# Patient Record
Sex: Female | Born: 1935
Health system: Southern US, Community
[De-identification: ages and names within clinical notes are randomized; demographics above are authoritative.]

## PROBLEM LIST (undated history)

## (undated) DIAGNOSIS — T4145XA Adverse effect of unspecified anesthetic, initial encounter: Secondary | ICD-10-CM

## (undated) DIAGNOSIS — M1990 Unspecified osteoarthritis, unspecified site: Secondary | ICD-10-CM

## (undated) DIAGNOSIS — R011 Cardiac murmur, unspecified: Secondary | ICD-10-CM

## (undated) DIAGNOSIS — I739 Peripheral vascular disease, unspecified: Secondary | ICD-10-CM

## (undated) DIAGNOSIS — E785 Hyperlipidemia, unspecified: Secondary | ICD-10-CM

## (undated) DIAGNOSIS — F32A Depression, unspecified: Secondary | ICD-10-CM

## (undated) DIAGNOSIS — K219 Gastro-esophageal reflux disease without esophagitis: Secondary | ICD-10-CM

## (undated) DIAGNOSIS — Z9289 Personal history of other medical treatment: Secondary | ICD-10-CM

## (undated) DIAGNOSIS — I10 Essential (primary) hypertension: Secondary | ICD-10-CM

## (undated) DIAGNOSIS — H3530 Unspecified macular degeneration: Secondary | ICD-10-CM

## (undated) DIAGNOSIS — F419 Anxiety disorder, unspecified: Secondary | ICD-10-CM

## (undated) DIAGNOSIS — E119 Type 2 diabetes mellitus without complications: Secondary | ICD-10-CM

## (undated) DIAGNOSIS — N200 Calculus of kidney: Secondary | ICD-10-CM

## (undated) DIAGNOSIS — F329 Major depressive disorder, single episode, unspecified: Secondary | ICD-10-CM

## (undated) DIAGNOSIS — Z87442 Personal history of urinary calculi: Secondary | ICD-10-CM

## (undated) DIAGNOSIS — R7303 Prediabetes: Secondary | ICD-10-CM

## (undated) DIAGNOSIS — T8859XA Other complications of anesthesia, initial encounter: Secondary | ICD-10-CM

## (undated) HISTORY — DX: Anxiety disorder, unspecified: F41.9

## (undated) HISTORY — PX: JOINT REPLACEMENT: SHX530

## (undated) HISTORY — PX: COLON SURGERY: SHX602

## (undated) HISTORY — DX: Major depressive disorder, single episode, unspecified: F32.9

## (undated) HISTORY — PX: APPENDECTOMY: SHX54

## (undated) HISTORY — DX: Type 2 diabetes mellitus without complications: E11.9

## (undated) HISTORY — DX: Hyperlipidemia, unspecified: E78.5

## (undated) HISTORY — PX: EYE SURGERY: SHX253

## (undated) HISTORY — PX: FRACTURE SURGERY: SHX138

## (undated) HISTORY — DX: Depression, unspecified: F32.A

## (undated) HISTORY — DX: Cardiac murmur, unspecified: R01.1

---

## 1999-06-12 ENCOUNTER — Encounter: Payer: Self-pay | Admitting: Orthopedic Surgery

## 1999-06-12 ENCOUNTER — Ambulatory Visit (HOSPITAL_COMMUNITY): Admission: RE | Admit: 1999-06-12 | Discharge: 1999-06-12 | Payer: Self-pay | Admitting: Orthopedic Surgery

## 2000-07-28 DIAGNOSIS — I10 Essential (primary) hypertension: Secondary | ICD-10-CM | POA: Insufficient documentation

## 2001-02-12 DIAGNOSIS — K5730 Diverticulosis of large intestine without perforation or abscess without bleeding: Secondary | ICD-10-CM | POA: Insufficient documentation

## 2001-02-12 DIAGNOSIS — M810 Age-related osteoporosis without current pathological fracture: Secondary | ICD-10-CM | POA: Insufficient documentation

## 2001-02-12 DIAGNOSIS — E785 Hyperlipidemia, unspecified: Secondary | ICD-10-CM | POA: Insufficient documentation

## 2004-09-07 ENCOUNTER — Inpatient Hospital Stay: Payer: Self-pay

## 2004-11-18 ENCOUNTER — Emergency Department: Payer: Self-pay | Admitting: General Practice

## 2005-03-14 ENCOUNTER — Ambulatory Visit: Payer: Self-pay | Admitting: Internal Medicine

## 2005-04-03 ENCOUNTER — Ambulatory Visit: Payer: Self-pay | Admitting: Internal Medicine

## 2005-05-03 ENCOUNTER — Ambulatory Visit: Payer: Self-pay | Admitting: Internal Medicine

## 2005-05-15 ENCOUNTER — Ambulatory Visit: Payer: Self-pay | Admitting: Family Medicine

## 2006-07-10 ENCOUNTER — Ambulatory Visit: Payer: Self-pay | Admitting: Family Medicine

## 2007-07-03 DIAGNOSIS — E119 Type 2 diabetes mellitus without complications: Secondary | ICD-10-CM | POA: Insufficient documentation

## 2007-07-08 DIAGNOSIS — F4322 Adjustment disorder with anxiety: Secondary | ICD-10-CM | POA: Insufficient documentation

## 2007-08-04 ENCOUNTER — Ambulatory Visit: Payer: Self-pay | Admitting: Family Medicine

## 2007-08-11 ENCOUNTER — Ambulatory Visit: Payer: Self-pay | Admitting: Gastroenterology

## 2007-10-21 ENCOUNTER — Other Ambulatory Visit: Payer: Self-pay

## 2007-10-21 ENCOUNTER — Inpatient Hospital Stay: Payer: Self-pay | Admitting: General Practice

## 2007-12-22 ENCOUNTER — Encounter: Payer: Self-pay | Admitting: General Practice

## 2008-01-02 ENCOUNTER — Encounter: Payer: Self-pay | Admitting: General Practice

## 2008-06-03 HISTORY — PX: BIOPSY THYROID: PRO38

## 2008-06-09 ENCOUNTER — Ambulatory Visit: Payer: Self-pay | Admitting: Unknown Physician Specialty

## 2008-08-11 ENCOUNTER — Ambulatory Visit: Payer: Self-pay | Admitting: Family Medicine

## 2009-08-25 DIAGNOSIS — M1990 Unspecified osteoarthritis, unspecified site: Secondary | ICD-10-CM | POA: Insufficient documentation

## 2009-11-09 ENCOUNTER — Ambulatory Visit: Payer: Self-pay | Admitting: Family Medicine

## 2011-03-26 ENCOUNTER — Ambulatory Visit: Payer: Self-pay | Admitting: Family Medicine

## 2011-06-19 ENCOUNTER — Ambulatory Visit: Payer: Self-pay | Admitting: Family Medicine

## 2011-07-01 ENCOUNTER — Ambulatory Visit: Payer: Self-pay | Admitting: Family Medicine

## 2011-07-25 ENCOUNTER — Ambulatory Visit: Payer: Self-pay | Admitting: General Surgery

## 2012-02-10 ENCOUNTER — Ambulatory Visit: Payer: Self-pay | Admitting: General Surgery

## 2012-03-02 ENCOUNTER — Ambulatory Visit: Payer: Self-pay | Admitting: Physical Medicine and Rehabilitation

## 2012-09-12 ENCOUNTER — Encounter (HOSPITAL_COMMUNITY): Payer: Self-pay | Admitting: Physical Medicine and Rehabilitation

## 2012-09-12 ENCOUNTER — Emergency Department (HOSPITAL_COMMUNITY)
Admission: EM | Admit: 2012-09-12 | Discharge: 2012-09-12 | Disposition: A | Discharge status: Home / Self Care | Payer: Medicare Other | Attending: Emergency Medicine | Admitting: Emergency Medicine

## 2012-09-12 DIAGNOSIS — G8929 Other chronic pain: Secondary | ICD-10-CM | POA: Insufficient documentation

## 2012-09-12 DIAGNOSIS — M5416 Radiculopathy, lumbar region: Secondary | ICD-10-CM

## 2012-09-12 DIAGNOSIS — IMO0002 Reserved for concepts with insufficient information to code with codable children: Secondary | ICD-10-CM | POA: Insufficient documentation

## 2012-09-12 HISTORY — DX: Unspecified macular degeneration: H35.30

## 2012-09-12 MED ORDER — ONDANSETRON HCL 4 MG/2ML IJ SOLN
4.0000 mg | Freq: Once | INTRAMUSCULAR | Status: AC
  Administered 2012-09-12: 4 mg via INTRAVENOUS
  Filled 2012-09-12: qty 2

## 2012-09-12 MED ORDER — MORPHINE SULFATE 2 MG/ML IJ SOLN
2.0000 mg | Freq: Once | INTRAMUSCULAR | Status: AC
  Administered 2012-09-12: 2 mg via INTRAVENOUS
  Filled 2012-09-12: qty 1

## 2012-09-12 MED ORDER — KETOROLAC TROMETHAMINE 30 MG/ML IJ SOLN
15.0000 mg | Freq: Once | INTRAMUSCULAR | Status: AC
  Administered 2012-09-12: 15 mg via INTRAVENOUS
  Filled 2012-09-12: qty 1

## 2012-09-12 MED ORDER — MORPHINE SULFATE 4 MG/ML IJ SOLN
4.0000 mg | Freq: Once | INTRAMUSCULAR | Status: AC
  Administered 2012-09-12: 4 mg via INTRAVENOUS
  Filled 2012-09-12: qty 1

## 2012-09-12 MED ORDER — OXYCODONE-ACETAMINOPHEN 5-325 MG PO TABS: 1.0000 | ORAL_TABLET | ORAL | Status: DC | PRN

## 2012-09-12 NOTE — ED Notes (Signed)
Pt presents to department for evaluation of chronic back pain. Pt states no relief with pain medications. Scheduled to see [facility] on 4/22. 10/10 pain that becomes worse with standing. Pt is alert and oriented x4.

## 2012-09-12 NOTE — ED Provider Notes (Signed)
History     CSN: [id number]  Arrival date & time 09/12/12  1151   First MD Initiated Contact with Patient 09/12/12 1309      Chief Complaint  Patient presents with  . Back Pain    (Consider location/radiation/quality/duration/timing/severity/associated sxs/prior treatment) HPI Comments: Patient comes to the emergency department for evaluation of back pain. Patient reports that she has been having progressively worsening low back pain for one year. She has been seeing a chiropractor with improvement, but in the last couple of weeks, pain has worsened. Pain is now severe in the lower back it radiates down the left leg. No numbness or tingling in the lower extremity. No change in bowel or bladder function. Patient reports that she has an appointment to see a neurosurgeon on the 22nd of this month. She has been using ibuprofen and Ultram for the pain, but they're not helping.  Patient is a 79 y.o. female presenting with back pain.  Back Pain Associated symptoms: no weakness     Past Medical History  Diagnosis Date  . Macular degeneration     No past surgical history on file.  No family history on file.  History  Substance Use Topics  . Smoking status: Never Smoker   . Smokeless tobacco: Not on file  . Alcohol Use: No    OB History   Grav Para Term Preterm Abortions TAB SAB Ect Mult Living                  Review of Systems  Musculoskeletal: Positive for back pain.  Neurological: Negative for weakness.  All other systems reviewed and are negative.    Allergies  Review of patient's allergies indicates no known allergies.  Home Medications  No current outpatient prescriptions on file.  BP 162/63  Pulse 72  Temp(Src) 98.1 F (36.7 C) (Oral)  Resp 18  SpO2 99%  Physical Exam  Constitutional: She is oriented to person, place, and time. She appears well-developed and well-nourished. No distress.  HENT:  Head: Normocephalic and atraumatic.  Right Ear: Hearing  normal.  Nose: Nose normal.  Mouth/Throat: Oropharynx is clear and moist and mucous membranes are normal.  Eyes: Conjunctivae and EOM are normal. Pupils are equal, round, and reactive to light.  Neck: Normal range of motion. Neck supple.  Cardiovascular: Normal rate, regular rhythm, S1 normal and S2 normal.  Exam reveals no gallop and no friction rub.   No murmur heard. Pulmonary/Chest: Effort normal and breath sounds normal. No respiratory distress. She exhibits no tenderness.  Abdominal: Soft. Normal appearance and bowel sounds are normal. There is no hepatosplenomegaly. There is no tenderness. There is no rebound, no guarding, no tenderness at McBurney's point and negative Murphy's sign. No hernia.  Musculoskeletal: Normal range of motion.  Neurological: She is alert and oriented to person, place, and time. She has normal strength. No cranial nerve deficit or sensory deficit. Coordination normal. GCS eye subscore is 4. GCS verbal subscore is 5. GCS motor subscore is 6.  Skin: Skin is warm, dry and intact. No rash noted. No cyanosis.  Psychiatric: She has a normal mood and affect. Her speech is normal and behavior is normal. Thought content normal.    ED Course  Procedures (including critical care time)  Labs Reviewed - No data to display No results found.   Diagnosis: Back pain    MDM  This and has low back pain radiating to the left leg has been present for a year. He has  worsened over recent weeks. Patient has been scheduled for followup with neurosurgery in 2 weeks. Her Ultram is not helping. Patient has normal strength and sensation. No neurologic deficits. She feels much better after being administered analgesia here in the ER. Will prescribe Percocet.        Gilda Crease, MD 09/12/12 1550

## 2012-10-08 ENCOUNTER — Other Ambulatory Visit: Payer: Self-pay | Admitting: Neurosurgery

## 2012-10-16 ENCOUNTER — Encounter (HOSPITAL_COMMUNITY): Payer: Self-pay

## 2012-10-17 NOTE — Pre-Procedure Instructions (Signed)
[name]  10/17/2012   Your procedure is scheduled on:  Friday, May 23rd  Report to [facility] at 0630 AM.  Call this number if you have problems the morning of surgery: [phone]   Remember:   Do not eat food or drink liquids after midnight.    Take these medicines the morning of surgery with A SIP OF WATER: xanax, vicodin if needed   Do not wear jewelry, make-up or nail polish.  Do not wear lotions, powders, or perfumes. You may wear deodorant.  Do not shave 48 hours prior to surgery. Men may shave face and neck.  Do not bring valuables to the hospital.  Contacts, dentures or bridgework may not be worn into surgery.  Leave suitcase in the car. After surgery it may be brought to your room.  For patients admitted to the hospital, checkout time is 11:00 AM the day of  discharge.   Patients discharged the day of surgery will not be allowed to drive  home.  Special Instructions: Shower using CHG 2 nights before surgery and the night before surgery.  If you shower the day of surgery use CHG.  Use special wash - you have one bottle of CHG for all showers.  You should use approximately 1/3 of the bottle for each shower.   Please read over the following fact sheets that you were given: Pain Booklet, Coughing and Deep Breathing, Blood Transfusion Information, MRSA Information and Surgical Site Infection Prevention

## 2012-10-19 ENCOUNTER — Encounter (HOSPITAL_COMMUNITY): Payer: Self-pay

## 2012-10-19 ENCOUNTER — Encounter (HOSPITAL_COMMUNITY)
Admission: RE | Admit: 2012-10-19 | Discharge: 2012-10-19 | Disposition: A | Discharge status: Home / Self Care | Payer: Medicare Other | Source: Ambulatory Visit | Attending: Neurosurgery | Admitting: Neurosurgery

## 2012-10-19 ENCOUNTER — Ambulatory Visit (HOSPITAL_COMMUNITY)
Admission: RE | Admit: 2012-10-19 | Discharge: 2012-10-19 | Disposition: A | Discharge status: Home / Self Care | Payer: Medicare Other | Source: Ambulatory Visit | Attending: Neurosurgery | Admitting: Neurosurgery

## 2012-10-19 DIAGNOSIS — Z01812 Encounter for preprocedural laboratory examination: Secondary | ICD-10-CM | POA: Insufficient documentation

## 2012-10-19 DIAGNOSIS — Z01810 Encounter for preprocedural cardiovascular examination: Secondary | ICD-10-CM | POA: Insufficient documentation

## 2012-10-19 DIAGNOSIS — Z01818 Encounter for other preprocedural examination: Secondary | ICD-10-CM | POA: Insufficient documentation

## 2012-10-19 DIAGNOSIS — K219 Gastro-esophageal reflux disease without esophagitis: Secondary | ICD-10-CM | POA: Insufficient documentation

## 2012-10-19 DIAGNOSIS — I10 Essential (primary) hypertension: Secondary | ICD-10-CM | POA: Insufficient documentation

## 2012-10-19 HISTORY — DX: Calculus of kidney: N20.0

## 2012-10-19 HISTORY — DX: Essential (primary) hypertension: I10

## 2012-10-19 HISTORY — DX: Adverse effect of unspecified anesthetic, initial encounter: T41.45XA

## 2012-10-19 HISTORY — DX: Personal history of other medical treatment: Z92.89

## 2012-10-19 HISTORY — DX: Unspecified osteoarthritis, unspecified site: M19.90

## 2012-10-19 HISTORY — DX: Other complications of anesthesia, initial encounter: T88.59XA

## 2012-10-19 HISTORY — DX: Gastro-esophageal reflux disease without esophagitis: K21.9

## 2012-10-19 HISTORY — DX: Peripheral vascular disease, unspecified: I73.9

## 2012-10-19 HISTORY — DX: Prediabetes: R73.03

## 2012-10-19 LAB — CBC
HCT: 38.8 % (ref 36.0–46.0)
Hemoglobin: 13.4 g/dL (ref 12.0–15.0)
MCH: 29.6 pg (ref 26.0–34.0)
MCV: 85.7 fL (ref 78.0–100.0)
RBC: 4.53 MIL/uL (ref 3.87–5.11)

## 2012-10-19 LAB — BASIC METABOLIC PANEL
CO2: 25 mEq/L (ref 19–32)
Glucose, Bld: 102 mg/dL — ABNORMAL HIGH (ref 70–99)
Potassium: 4.2 mEq/L (ref 3.5–5.1)
Sodium: 140 mEq/L (ref 135–145)

## 2012-10-19 LAB — ABO/RH: ABO/RH(D): A POS

## 2012-10-22 MED ORDER — CEFAZOLIN SODIUM-DEXTROSE 2-3 GM-% IV SOLR: 2.0000 g | INTRAVENOUS | Status: DC

## 2012-10-23 ENCOUNTER — Inpatient Hospital Stay (HOSPITAL_COMMUNITY)
Admission: RE | Admit: 2012-10-23 | Discharge: 2012-10-29 | DRG: 460 | Disposition: A | Discharge status: Skilled Nursing Facility | Payer: Medicare Other | Source: Ambulatory Visit | Attending: Neurosurgery | Admitting: Neurosurgery

## 2012-10-23 ENCOUNTER — Encounter (HOSPITAL_COMMUNITY): Payer: Self-pay | Admitting: Anesthesiology

## 2012-10-23 ENCOUNTER — Encounter (HOSPITAL_COMMUNITY): Payer: Self-pay | Admitting: *Deleted

## 2012-10-23 ENCOUNTER — Ambulatory Visit (HOSPITAL_COMMUNITY): Payer: Medicare Other

## 2012-10-23 ENCOUNTER — Ambulatory Visit (HOSPITAL_COMMUNITY): Payer: Medicare Other | Admitting: Anesthesiology

## 2012-10-23 ENCOUNTER — Encounter (HOSPITAL_COMMUNITY)
Admission: RE | Disposition: A | Discharge status: Skilled Nursing Facility | Payer: Self-pay | Source: Ambulatory Visit | Attending: Neurosurgery

## 2012-10-23 DIAGNOSIS — Q762 Congenital spondylolisthesis: Principal | ICD-10-CM

## 2012-10-23 DIAGNOSIS — M51379 Other intervertebral disc degeneration, lumbosacral region without mention of lumbar back pain or lower extremity pain: Secondary | ICD-10-CM | POA: Diagnosis present

## 2012-10-23 DIAGNOSIS — H3530 Unspecified macular degeneration: Secondary | ICD-10-CM | POA: Diagnosis present

## 2012-10-23 DIAGNOSIS — R269 Unspecified abnormalities of gait and mobility: Secondary | ICD-10-CM | POA: Diagnosis present

## 2012-10-23 DIAGNOSIS — K219 Gastro-esophageal reflux disease without esophagitis: Secondary | ICD-10-CM | POA: Diagnosis present

## 2012-10-23 DIAGNOSIS — M4316 Spondylolisthesis, lumbar region: Secondary | ICD-10-CM

## 2012-10-23 DIAGNOSIS — Z96659 Presence of unspecified artificial knee joint: Secondary | ICD-10-CM

## 2012-10-23 DIAGNOSIS — M5137 Other intervertebral disc degeneration, lumbosacral region: Secondary | ICD-10-CM | POA: Diagnosis present

## 2012-10-23 DIAGNOSIS — Z9049 Acquired absence of other specified parts of digestive tract: Secondary | ICD-10-CM

## 2012-10-23 DIAGNOSIS — I739 Peripheral vascular disease, unspecified: Secondary | ICD-10-CM | POA: Diagnosis present

## 2012-10-23 DIAGNOSIS — I10 Essential (primary) hypertension: Secondary | ICD-10-CM | POA: Diagnosis present

## 2012-10-23 DIAGNOSIS — M4310 Spondylolisthesis, site unspecified: Secondary | ICD-10-CM

## 2012-10-23 HISTORY — PX: SPINE SURGERY: SHX786

## 2012-10-23 LAB — GLUCOSE, CAPILLARY: Glucose-Capillary: 150 mg/dL — ABNORMAL HIGH (ref 70–99)

## 2012-10-23 SURGERY — POSTERIOR LUMBAR FUSION 1 LEVEL
Anesthesia: General | Site: Back | Wound class: Clean

## 2012-10-23 MED ORDER — ACETAMINOPHEN 10 MG/ML IV SOLN
1000.0000 mg | Freq: Four times a day (QID) | INTRAVENOUS | Status: AC
  Administered 2012-10-23 – 2012-10-24 (×4): 1000 mg via INTRAVENOUS
  Filled 2012-10-23 (×4): qty 100

## 2012-10-23 MED ORDER — SODIUM CHLORIDE 0.9 % IJ SOLN
3.0000 mL | Freq: Two times a day (BID) | INTRAMUSCULAR | Status: DC
  Administered 2012-10-24 – 2012-10-26 (×6): 3 mL via INTRAVENOUS

## 2012-10-23 MED ORDER — SODIUM CHLORIDE 0.9 % IV SOLN: 250.0000 mL | INTRAVENOUS | Status: DC

## 2012-10-23 MED ORDER — 0.9 % SODIUM CHLORIDE (POUR BTL) OPTIME
TOPICAL | Status: DC | PRN
  Administered 2012-10-23 (×2): 1000 mL

## 2012-10-23 MED ORDER — PROPOFOL 10 MG/ML IV BOLUS
INTRAVENOUS | Status: DC | PRN
  Administered 2012-10-23: 10 mg via INTRAVENOUS
  Administered 2012-10-23: 150 mg via INTRAVENOUS

## 2012-10-23 MED ORDER — B COMPLEX PO TABS: 1.0000 | ORAL_TABLET | Freq: Every day | ORAL | Status: DC

## 2012-10-23 MED ORDER — ROCURONIUM BROMIDE 100 MG/10ML IV SOLN
INTRAVENOUS | Status: DC | PRN
  Administered 2012-10-23: 50 mg via INTRAVENOUS
  Administered 2012-10-23 (×2): 10 mg via INTRAVENOUS
  Administered 2012-10-23: 20 mg via INTRAVENOUS

## 2012-10-23 MED ORDER — ONDANSETRON HCL 4 MG/2ML IJ SOLN: 4.0000 mg | Freq: Four times a day (QID) | INTRAMUSCULAR | Status: DC | PRN

## 2012-10-23 MED ORDER — OXYCODONE HCL 5 MG PO TABS
5.0000 mg | ORAL_TABLET | ORAL | Status: DC | PRN
  Administered 2012-10-24: 5 mg via ORAL
  Administered 2012-10-24: 10 mg via ORAL
  Administered 2012-10-24 (×2): 5 mg via ORAL
  Administered 2012-10-25: 10 mg via ORAL
  Administered 2012-10-26 (×2): 5 mg via ORAL
  Administered 2012-10-27 – 2012-10-28 (×3): 10 mg via ORAL
  Administered 2012-10-29: 5 mg via ORAL
  Filled 2012-10-23: qty 2
  Filled 2012-10-23: qty 1
  Filled 2012-10-23 (×2): qty 2
  Filled 2012-10-23 (×2): qty 1
  Filled 2012-10-23 (×2): qty 2
  Filled 2012-10-23 (×2): qty 1
  Filled 2012-10-23: qty 2

## 2012-10-23 MED ORDER — CEFAZOLIN SODIUM-DEXTROSE 2-3 GM-% IV SOLR
INTRAVENOUS | Status: AC
  Filled 2012-10-23: qty 50

## 2012-10-23 MED ORDER — OXYCODONE HCL 5 MG/5ML PO SOLN: 5.0000 mg | Freq: Once | ORAL | Status: DC | PRN

## 2012-10-23 MED ORDER — CALCIUM CARBONATE ANTACID 500 MG PO CHEW
750.0000 mg | CHEWABLE_TABLET | Freq: Every day | ORAL | Status: DC | PRN
  Filled 2012-10-23: qty 1.5

## 2012-10-23 MED ORDER — ALPRAZOLAM 0.25 MG PO TABS
0.2500 mg | ORAL_TABLET | Freq: Every day | ORAL | Status: DC
  Administered 2012-10-24 – 2012-10-29 (×6): 0.25 mg via ORAL
  Filled 2012-10-23 (×6): qty 1

## 2012-10-23 MED ORDER — ARTIFICIAL TEARS OP OINT
TOPICAL_OINTMENT | OPHTHALMIC | Status: DC | PRN
  Administered 2012-10-23: 1 via OPHTHALMIC

## 2012-10-23 MED ORDER — EPHEDRINE SULFATE 50 MG/ML IJ SOLN
INTRAMUSCULAR | Status: DC | PRN
  Administered 2012-10-23: 5 mg via INTRAVENOUS
  Administered 2012-10-23: 10 mg via INTRAVENOUS
  Administered 2012-10-23 (×2): 5 mg via INTRAVENOUS
  Administered 2012-10-23: 10 mg via INTRAVENOUS
  Administered 2012-10-23: 5 mg via INTRAVENOUS
  Administered 2012-10-23: 10 mg via INTRAVENOUS
  Administered 2012-10-23 (×2): 5 mg via INTRAVENOUS
  Administered 2012-10-23: 10 mg via INTRAVENOUS
  Administered 2012-10-23 (×2): 5 mg via INTRAVENOUS

## 2012-10-23 MED ORDER — POTASSIUM CHLORIDE IN NACL 20-0.9 MEQ/L-% IV SOLN
INTRAVENOUS | Status: DC
  Administered 2012-10-23: 21:00:00 via INTRAVENOUS
  Filled 2012-10-23 (×13): qty 1000

## 2012-10-23 MED ORDER — OCUVITE PO TABS
1.0000 | ORAL_TABLET | Freq: Every day | ORAL | Status: DC
  Administered 2012-10-23 – 2012-10-28 (×6): 1 via ORAL
  Filled 2012-10-23 (×7): qty 1

## 2012-10-23 MED ORDER — BUPIVACAINE LIPOSOME 1.3 % IJ SUSP
INTRAMUSCULAR | Status: DC | PRN
  Administered 2012-10-23: 20 mL

## 2012-10-23 MED ORDER — LACTATED RINGERS IV SOLN
INTRAVENOUS | Status: DC | PRN
  Administered 2012-10-23 (×2): via INTRAVENOUS

## 2012-10-23 MED ORDER — DIAZEPAM 5 MG PO TABS
5.0000 mg | ORAL_TABLET | Freq: Four times a day (QID) | ORAL | Status: DC | PRN
  Administered 2012-10-24 – 2012-10-25 (×2): 5 mg via ORAL
  Filled 2012-10-23 (×3): qty 1

## 2012-10-23 MED ORDER — POLYETHYLENE GLYCOL 3350 17 G PO PACK
17.0000 g | PACK | Freq: Every day | ORAL | Status: DC | PRN
  Filled 2012-10-23 (×2): qty 1

## 2012-10-23 MED ORDER — OXYCODONE HCL 5 MG PO TABS: 5.0000 mg | ORAL_TABLET | Freq: Once | ORAL | Status: DC | PRN

## 2012-10-23 MED ORDER — MORPHINE SULFATE 2 MG/ML IJ SOLN
1.0000 mg | INTRAMUSCULAR | Status: DC | PRN
  Administered 2012-10-24: 2 mg via INTRAVENOUS
  Filled 2012-10-23: qty 1

## 2012-10-23 MED ORDER — GLYCOPYRROLATE 0.2 MG/ML IJ SOLN
INTRAMUSCULAR | Status: DC | PRN
  Administered 2012-10-23: 0.6 mg via INTRAVENOUS
  Administered 2012-10-23: .4 mg via INTRAVENOUS

## 2012-10-23 MED ORDER — SODIUM CHLORIDE 0.9 % IV SOLN
INTRAVENOUS | Status: DC | PRN
  Administered 2012-10-23: 16:00:00 via INTRAVENOUS

## 2012-10-23 MED ORDER — HYDROMORPHONE HCL PF 1 MG/ML IJ SOLN
INTRAMUSCULAR | Status: AC
  Filled 2012-10-23: qty 1

## 2012-10-23 MED ORDER — LIDOCAINE HCL (CARDIAC) 20 MG/ML IV SOLN
INTRAVENOUS | Status: DC | PRN
  Administered 2012-10-23: 40 mg via INTRAVENOUS
  Administered 2012-10-23: 100 mg via INTRAVENOUS

## 2012-10-23 MED ORDER — CEFAZOLIN SODIUM 1-5 GM-% IV SOLN
1.0000 g | Freq: Three times a day (TID) | INTRAVENOUS | Status: AC
  Administered 2012-10-23 – 2012-10-24 (×2): 1 g via INTRAVENOUS
  Filled 2012-10-23 (×2): qty 50

## 2012-10-23 MED ORDER — HYDROCHLOROTHIAZIDE 25 MG PO TABS
25.0000 mg | ORAL_TABLET | Freq: Every day | ORAL | Status: DC
  Administered 2012-10-23 – 2012-10-29 (×6): 25 mg via ORAL
  Filled 2012-10-23 (×7): qty 1

## 2012-10-23 MED ORDER — HYDROMORPHONE HCL PF 1 MG/ML IJ SOLN
0.2500 mg | INTRAMUSCULAR | Status: DC | PRN
  Administered 2012-10-23: 0.5 mg via INTRAVENOUS

## 2012-10-23 MED ORDER — FENTANYL CITRATE 0.05 MG/ML IJ SOLN
INTRAMUSCULAR | Status: DC | PRN
  Administered 2012-10-23 (×3): 50 ug via INTRAVENOUS
  Administered 2012-10-23: 100 ug via INTRAVENOUS
  Administered 2012-10-23 (×5): 50 ug via INTRAVENOUS

## 2012-10-23 MED ORDER — VITAMIN D3 25 MCG (1000 UNIT) PO TABS
1000.0000 [IU] | ORAL_TABLET | Freq: Every day | ORAL | Status: DC
  Administered 2012-10-24 – 2012-10-29 (×6): 1000 [IU] via ORAL
  Filled 2012-10-23 (×6): qty 1

## 2012-10-23 MED ORDER — ONDANSETRON HCL 4 MG/2ML IJ SOLN: 4.0000 mg | INTRAMUSCULAR | Status: DC | PRN

## 2012-10-23 MED ORDER — BUPIVACAINE LIPOSOME 1.3 % IJ SUSP
20.0000 mL | INTRAMUSCULAR | Status: DC
  Filled 2012-10-23: qty 20

## 2012-10-23 MED ORDER — LISINOPRIL 20 MG PO TABS
20.0000 mg | ORAL_TABLET | Freq: Every day | ORAL | Status: DC
  Administered 2012-10-23 – 2012-10-29 (×6): 20 mg via ORAL
  Filled 2012-10-23 (×7): qty 1

## 2012-10-23 MED ORDER — MENTHOL 3 MG MT LOZG: 1.0000 | LOZENGE | OROMUCOSAL | Status: DC | PRN

## 2012-10-23 MED ORDER — LISINOPRIL-HYDROCHLOROTHIAZIDE 20-25 MG PO TABS: 1.0000 | ORAL_TABLET | Freq: Every day | ORAL | Status: DC

## 2012-10-23 MED ORDER — LIDOCAINE-EPINEPHRINE 0.5 %-1:200000 IJ SOLN
INTRAMUSCULAR | Status: DC | PRN
  Administered 2012-10-23: 10 mL

## 2012-10-23 MED ORDER — SENNA 8.6 MG PO TABS
1.0000 | ORAL_TABLET | Freq: Two times a day (BID) | ORAL | Status: DC
  Administered 2012-10-23 – 2012-10-29 (×12): 8.6 mg via ORAL
  Filled 2012-10-23 (×15): qty 1

## 2012-10-23 MED ORDER — PSYLLIUM 95 % PO PACK
1.0000 | PACK | Freq: Every day | ORAL | Status: DC
  Administered 2012-10-23: 1 via ORAL
  Administered 2012-10-24 – 2012-10-26 (×3): via ORAL
  Administered 2012-10-27 – 2012-10-29 (×3): 1 via ORAL
  Filled 2012-10-23 (×7): qty 1

## 2012-10-23 MED ORDER — PHENOL 1.4 % MT LIQD
1.0000 | OROMUCOSAL | Status: DC | PRN
  Administered 2012-10-23: 1 via OROMUCOSAL
  Filled 2012-10-23: qty 177

## 2012-10-23 MED ORDER — THROMBIN 20000 UNITS EX SOLR
CUTANEOUS | Status: DC | PRN
  Administered 2012-10-23: 14:00:00 via TOPICAL

## 2012-10-23 MED ORDER — ONDANSETRON HCL 4 MG/2ML IJ SOLN
INTRAMUSCULAR | Status: DC | PRN
  Administered 2012-10-23: 4 mg via INTRAVENOUS

## 2012-10-23 MED ORDER — CEFAZOLIN SODIUM-DEXTROSE 2-3 GM-% IV SOLR
INTRAVENOUS | Status: AC
  Administered 2012-10-23 (×2): 2 g via INTRAVENOUS
  Filled 2012-10-23: qty 50

## 2012-10-23 MED ORDER — SODIUM CHLORIDE 0.9 % IJ SOLN: 3.0000 mL | INTRAMUSCULAR | Status: DC | PRN

## 2012-10-23 MED ORDER — NEOSTIGMINE METHYLSULFATE 1 MG/ML IJ SOLN
INTRAMUSCULAR | Status: DC | PRN
  Administered 2012-10-23: 4 mg via INTRAVENOUS

## 2012-10-23 MED ORDER — B COMPLEX-C PO TABS
1.0000 | ORAL_TABLET | Freq: Every day | ORAL | Status: DC
  Administered 2012-10-24 – 2012-10-29 (×6): 1 via ORAL
  Filled 2012-10-23 (×6): qty 1

## 2012-10-23 SURGICAL SUPPLY — 69 items
BENZOIN TINCTURE PRP APPL 2/3 (GAUZE/BANDAGES/DRESSINGS) IMPLANT
BLADE SURG ROTATE 9660 (MISCELLANEOUS) IMPLANT
BONE MATRIX OSTEOCEL PLUS 5CC (Bone Implant) ×2 IMPLANT
BUR MATCHSTICK NEURO 3.0 LAGG (BURR) ×2 IMPLANT
CAGE COROENT MP 11X23X9 (Cage) ×2 IMPLANT
CANISTER SUCTION 2500CC (MISCELLANEOUS) ×2 IMPLANT
CLOTH BEACON ORANGE TIMEOUT ST (SAFETY) ×2 IMPLANT
CONT SPEC 4OZ CLIKSEAL STRL BL (MISCELLANEOUS) ×2 IMPLANT
COVER BACK TABLE 24X17X13 BIG (DRAPES) IMPLANT
DECANTER SPIKE VIAL GLASS SM (MISCELLANEOUS) ×2 IMPLANT
DERMABOND ADHESIVE PROPEN (GAUZE/BANDAGES/DRESSINGS) ×1
DERMABOND ADVANCED (GAUZE/BANDAGES/DRESSINGS)
DERMABOND ADVANCED .7 DNX12 (GAUZE/BANDAGES/DRESSINGS) IMPLANT
DERMABOND ADVANCED .7 DNX6 (GAUZE/BANDAGES/DRESSINGS) ×1 IMPLANT
DRAPE C-ARM 42X72 X-RAY (DRAPES) ×4 IMPLANT
DRAPE LAPAROTOMY 100X72X124 (DRAPES) ×2 IMPLANT
DRAPE POUCH INSTRU U-SHP 10X18 (DRAPES) ×2 IMPLANT
DRAPE SURG 17X23 STRL (DRAPES) ×4 IMPLANT
DRESSING TELFA 8X3 (GAUZE/BANDAGES/DRESSINGS) IMPLANT
DURAPREP 26ML APPLICATOR (WOUND CARE) ×4 IMPLANT
ELECT BLADE 4.0 EZ CLEAN MEGAD (MISCELLANEOUS) ×2
ELECT REM PT RETURN 9FT ADLT (ELECTROSURGICAL) ×2
ELECTRODE BLDE 4.0 EZ CLN MEGD (MISCELLANEOUS) ×1 IMPLANT
ELECTRODE REM PT RTRN 9FT ADLT (ELECTROSURGICAL) ×1 IMPLANT
GAUZE SPONGE 4X4 16PLY XRAY LF (GAUZE/BANDAGES/DRESSINGS) ×2 IMPLANT
GLOVE BIOGEL PI IND STRL 8.5 (GLOVE) ×2 IMPLANT
GLOVE BIOGEL PI INDICATOR 8.5 (GLOVE) ×2
GLOVE ECLIPSE 6.5 STRL STRAW (GLOVE) ×4 IMPLANT
GLOVE EXAM NITRILE LRG STRL (GLOVE) IMPLANT
GLOVE EXAM NITRILE MD LF STRL (GLOVE) ×2 IMPLANT
GLOVE EXAM NITRILE XL STR (GLOVE) IMPLANT
GLOVE EXAM NITRILE XS STR PU (GLOVE) IMPLANT
GLOVE SURG SS PI 8.0 STRL IVOR (GLOVE) ×8 IMPLANT
GOWN BRE IMP SLV AUR LG STRL (GOWN DISPOSABLE) IMPLANT
GOWN BRE IMP SLV AUR XL STRL (GOWN DISPOSABLE) IMPLANT
GOWN STRL REIN 2XL LVL4 (GOWN DISPOSABLE) IMPLANT
KIT BASIN OR (CUSTOM PROCEDURE TRAY) ×2 IMPLANT
KIT POSITION SURG JACKSON T1 (MISCELLANEOUS) ×2 IMPLANT
KIT ROOM TURNOVER OR (KITS) ×2 IMPLANT
MILL MEDIUM DISP (BLADE) ×2 IMPLANT
NEEDLE HYPO 21X1.5 SAFETY (NEEDLE) ×2 IMPLANT
NEEDLE HYPO 25X1 1.5 SAFETY (NEEDLE) ×2 IMPLANT
NEEDLE SPNL 18GX3.5 QUINCKE PK (NEEDLE) ×2 IMPLANT
NS IRRIG 1000ML POUR BTL (IV SOLUTION) ×2 IMPLANT
PACK LAMINECTOMY NEURO (CUSTOM PROCEDURE TRAY) ×2 IMPLANT
PAD ARMBOARD 7.5X6 YLW CONV (MISCELLANEOUS) ×6 IMPLANT
ROD 35MM (Rod) ×4 IMPLANT
SCREW LOCK (Screw) ×4 IMPLANT
SCREW LOCK FXNS SPNE MAS PL (Screw) ×4 IMPLANT
SCREW MAS PLIF 5.5X30 (Screw) ×1 IMPLANT
SCREW PAS PLIF 5X30 (Screw) ×1 IMPLANT
SCREW PLIF MAS 5.0X25MM (Screw) ×2 IMPLANT
SCREW SHANK 5.0X35 (Screw) ×4 IMPLANT
SCREW TULIP 5.5 (Screw) ×4 IMPLANT
SPONGE GAUZE 4X4 12PLY (GAUZE/BANDAGES/DRESSINGS) IMPLANT
SPONGE LAP 4X18 X RAY DECT (DISPOSABLE) IMPLANT
SPONGE SURGIFOAM ABS GEL 100 (HEMOSTASIS) ×2 IMPLANT
STRIP CLOSURE SKIN 1/2X4 (GAUZE/BANDAGES/DRESSINGS) IMPLANT
SUT PROLENE 6 0 BV (SUTURE) IMPLANT
SUT VIC AB 0 CT1 18XCR BRD8 (SUTURE) ×2 IMPLANT
SUT VIC AB 0 CT1 8-18 (SUTURE) ×2
SUT VIC AB 2-0 CT1 18 (SUTURE) ×2 IMPLANT
SUT VIC AB 3-0 SH 8-18 (SUTURE) ×4 IMPLANT
SYR 20CC LL (SYRINGE) ×2 IMPLANT
SYR 20ML ECCENTRIC (SYRINGE) ×2 IMPLANT
TOWEL OR 17X24 6PK STRL BLUE (TOWEL DISPOSABLE) ×2 IMPLANT
TOWEL OR 17X26 10 PK STRL BLUE (TOWEL DISPOSABLE) ×2 IMPLANT
TRAY FOLEY CATH 14FRSI W/METER (CATHETERS) ×2 IMPLANT
WATER STERILE IRR 1000ML POUR (IV SOLUTION) ×2 IMPLANT

## 2012-10-23 NOTE — Preoperative (Signed)
Beta Blockers   Reason not to administer Beta Blockers:Not Applicable 

## 2012-10-23 NOTE — Transfer of Care (Signed)
Immediate Anesthesia Transfer of Care Note  Patient: Sherry Brooks  Procedure(s) Performed: Procedure(s) with comments: POSTERIOR LUMBAR FUSION 1 LEVEL (N/A) - Lumbar four-five maximum access surgery posterior lumbar interbody fusion, posterolateral arthrodesis and posterior nonsegmental instrumentation  Patient Location: PACU  Anesthesia Type:General  Level of Consciousness: awake, alert  and sedated  Airway & Oxygen Therapy: Patient connected to face mask oxygen  Post-op Assessment: Report given to PACU RN  Post vital signs: stable  Complications: No apparent anesthesia complications

## 2012-10-23 NOTE — H&P (Signed)
BP 108/66  Pulse 56  Temp(Src) 96.7 F (35.9 C) (Oral)  Resp 18  SpO2 99%   HISTORY OF PRESENT ILLNESS:                     [name] comes in today for evaluation of pain which she has had in her back and both lower extremities, especially on the left side, for at least the last year.  She states over the last six months it has gotten much worse.  She was seen in the [facility] Emergency Room and had an evaluation of the hips.  X-rays of the hips did not show any problems.  She then underwent an MRI of the lumbar spine and had that done in 02/2012.  That was ordered by Dr. Merri Ray.  What that showed was she had a significant degeneration at L4-L5 and anterolisthesis at L4-L5 and severe spinal stenosis.  She has had physical therapy.  She has undergone injections and she has had chiropractic treatment, all without relief.  She returns today.     REVIEW OF SYSTEMS:                                    Positive for low back pain.  She has had gastrointestinal problems.  Denies constitutional, ear, nose, throat, mouth, cardiovascular, endocrine, hematologic, or allergic problems.  She has no neurologic problems.   PAST MEDICAL HISTORY:                                Diverticulitis.   Prior Operations: She has undergone surgery for diverticulitis of the colon, partial colon resection, appendectomy, right total knee replacement, left total knee replacement, and she has had ORIF of the left distal radius and ulnar fracture in 10/2007.   Medications and Allergies:  Alprazolam, vitamin B complex, calcium carbonate, cholecalciferol, Cipro, hydrocodone, ibuprofen, lisinopril, Metamucil, Ocuvite, and vitamin E.   FAMILY HISTORY:                                            Otherwise noncontributory.   SOCIAL HISTORY:                                            She is widowed.  She does not use tobacco.  She does not use alcohol.                  PHYSICAL EXAMINATION:                                 She weighs 212 pounds and is 154 cm in height.  On evaluation, Sherry Brooks is alert, oriented x4, answering all questions appropriately.  Memory, language, attention span, and fund of knowledge are otherwise normal.            HEENT -Significant visual field problems and eye problems secondary to macular degeneration.  She is unable to read with either eye.  Hearing is intact.  Symmetric facial sensation and movement.  Tongue and uvula are in  midline.  Shoulder shrug is normal.             Musculoskeletal Examination - Markedly antalgic gait.    NEUROLOGICAL EXAMINATION:                      Motor Examination -She has normal strength in her lower extremities.  Hip flexors, hip extensions, quadriceps, hamstrings, dorsiflexors, and plantar flexors.              Deep Tendon Reflexes -  2+ at the knees.  They are trace to 1 at the ankles bilaterally.             Cerebellar Examination - Intact proprioception.  No clonus, no Hoffman's sign.    DIAGNOSTIC DATA:                                          MRI is reviewed.  What if shows is severe facet arthropathy at L4-L5, severe stenosis and anterolisthesis due to degeneration at L4-L5.  Clonus is otherwise normal.  Cauda equinus is otherwise normal.  She has some facet arthropathy at L5-S1, and some lateral recess narrowing.  Degenerative disk disease in the entire lumbar spine.     DIAGNOSES: 1.     Lumbar spondylolisthesis. 2.     Degenerative L4-L5, grade 1 M2 lumbar stenosis. 3.     Lumbar radiculopathy.   IMPRESSION/PLAN:                                         Sherry Brooks has had a full complement of conservative treatment.  Given that there is no reason to deny her surgery, even though she is 79 years of age, I do think that she can certainly be made better with the operation.  Perfection is not going to occur, which means that she would be pain free afterwards, not my expectation.  Risks and benefits of the procedure,  along with the model which I used to show her the operation, were discussed.  I gave her a very detailed instruction sheet with regards to lumbar interbody arthrodesis.  When the family asked about the disk herniation at L4-L5, I said that the disk is typically removed for PLIF.  With this understanding, she understands and wishes to proceed.  She understands that there is certainly a risk of bowel and bladder dysfunction, weakness in the lower extremities, but with that she would like to proceed.

## 2012-10-23 NOTE — Anesthesia Postprocedure Evaluation (Signed)
  Anesthesia Post-op Note  Patient: Sherry Brooks  Procedure(s) Performed: Procedure(s) with comments: POSTERIOR LUMBAR FUSION 1 LEVEL (N/A) - Lumbar four-five maximum access surgery posterior lumbar interbody fusion, posterolateral arthrodesis and posterior nonsegmental instrumentation  Patient Location: PACU  Anesthesia Type:General  Level of Consciousness: awake, alert , oriented and patient cooperative  Airway and Oxygen Therapy: Patient Spontanous Breathing and Patient connected to nasal cannula oxygen  Post-op Pain: none  Post-op Assessment: Post-op Vital signs reviewed, Patient's Cardiovascular Status Stable, Respiratory Function Stable, Patent Airway, No signs of Nausea or vomiting and Pain level controlled  Post-op Vital Signs: Reviewed and stable, EKG unchanged from pre-op EKG  Complications: No apparent anesthesia complications

## 2012-10-23 NOTE — Anesthesia Preprocedure Evaluation (Signed)
Anesthesia Evaluation  Patient identified by MRN, date of birth, ID band Patient awake    Reviewed: Allergy & Precautions, H&P , NPO status , Patient's Chart, lab work & pertinent test results  Airway Mallampati: II  Neck ROM: full    Dental   Pulmonary          Cardiovascular hypertension, + Peripheral Vascular Disease     Neuro/Psych    GI/Hepatic GERD-  ,  Endo/Other  obese  Renal/GU      Musculoskeletal  (+) Arthritis -,   Abdominal   Peds  Hematology   Anesthesia Other Findings   Reproductive/Obstetrics                           Anesthesia Physical Anesthesia Plan  ASA: II  Anesthesia Plan: General   Post-op Pain Management:    Induction: Intravenous  Airway Management Planned: Oral ETT  Additional Equipment:   Intra-op Plan:   Post-operative Plan: Extubation in OR  Informed Consent: I have reviewed the patients History and Physical, chart, labs and discussed the procedure including the risks, benefits and alternatives for the proposed anesthesia with the patient or authorized representative who has indicated his/her understanding and acceptance.     Plan Discussed with: CRNA, Anesthesiologist and Surgeon  Anesthesia Plan Comments:         Anesthesia Quick Evaluation

## 2012-10-23 NOTE — Anesthesia Procedure Notes (Signed)
Procedure Name: Intubation Date/Time: 10/23/2012 12:54 PM Performed by: Sherie Don Pre-anesthesia Checklist: Patient identified, Emergency Drugs available, Suction available, Patient being monitored and Timeout performed Patient Re-evaluated:Patient Re-evaluated prior to inductionOxygen Delivery Method: Circle system utilized Preoxygenation: Pre-oxygenation with 100% oxygen Intubation Type: IV induction Ventilation: Mask ventilation without difficulty Laryngoscope Size: Mac and 3 Grade View: Grade II Tube type: Oral Tube size: 7.0 mm Number of attempts: 1 Airway Equipment and Method: Stylet Placement Confirmation: ETT inserted through vocal cords under direct vision,  positive ETCO2 and breath sounds checked- equal and bilateral Secured at: 22 cm Tube secured with: Tape Dental Injury: Teeth and Oropharynx as per pre-operative assessment

## 2012-10-23 NOTE — Op Note (Signed)
10/23/2012  9:07 PM  PATIENT:  Sherry Brooks  79 y.o. female  PRE-OPERATIVE DIAGNOSIS:  spondylolisthesis lumbar stenosis lumbar radiculopathy L4/5  POST-OPERATIVE DIAGNOSIS:  spondylolisthesis lumbar stenosis lumbar radiculopathy L4/5  PROCEDURE:  Procedure(s): POSTERIOR LUMBAR FUSION 1 LEVEL, Posterolateral arthrodesis L4/5 morselized allograft, autograft 11mm x 23mm peek cages x 2 Medial to lateal pedicle screw fixation, non segmental, nuvasive 5.33mmx35mm screws in L4 and L5.   SURGEON:  Surgeon(s): Carmela Hurt, MD Tia Alert, MD  ASSISTANTS:JOnes, david  ANESTHESIA:   general  EBL:     BLOOD ADMINISTERED:none  CELL SAVER GIVEN:none  COUNT:per nursing  DRAINS: none   SPECIMEN:  No Specimen  DICTATION: Mrs. Weese was taken to the operating room intubated, and placed under  general anesthesia without difficulty. A foley catheter was placed under sterile conditions. She was positioned prone on a Jackson table with all pressure points properly padded. Her back was prepped and she was draped in a sterile manner. I infiltrated lidocaine into the planned incision in the lumbar area. I opened the skin with a 10 blade and then took the incision down to the thoracolumbar fascia. I exposed the lamina of L3,4,and 5 bilaterally. I confirmed my location with an intraoperative xray using fluoroscopy. I placed self retaining retractors exposing the pars of L4, and L5 bilaterally. I placed pedicle screws in a medial to lateral trajectory, through cortical bone, in L4. I first drilled a pilot hole, then cannulated the entire planned trajectory of the screw using fluoro at L4. I then tapped the pedicle and placed th 5.33mm x 30mm screws in L4 bilaterally. I could not see the pedicles of L5 well enough at that time so I decided to complete the decompression and plif. Dr. Yetta Barre assisted with the pedicle screw placement.  I decompressed the spinal canal at L4/5 by performing a  complete laminectomy of L4. The ligamentum flavum was quite thick and obviously compressing the thecal sac. I was able to fully decompress the L4 and L5 roots bilaterally. I exposed the disc space and performed discetomies bilaterally to prepare for the cage placement. I used a number of tools to empty the disc space of soft tissue, rongeurs, curettes, disc space shavers, rasps, and cutters. Once the disc space was ready I placed two cages 11x51mm filled with autograft. The cages were placed without difficulty.  I then completed the pedicle screw placement using fluoro and the now exposed pedicles. The screws were placed without difficulty in the same sequence as previously described.  I decorticated the facets bilaterally and placed allo and auto graft over them. I connected the rods to the screw heads and secured them with locking caps. I irrigated the wound then closed with vicryl sutures. I used dermabond for a sterile dressing.   PLAN OF CARE: Admit to inpatient   PATIENT DISPOSITION:  PACU - hemodynamically stable.   Delay start of Pharmacological VTE agent (>24hrs) due to surgical blood loss or risk of bleeding:  yes

## 2012-10-24 NOTE — Evaluation (Signed)
Physical Therapy Evaluation Patient Details Name: Sherry Brooks MRN: [id number] DOB: 1929-01-27 Today's Date: 10/24/2012 Time: 1478-2956 PT Time Calculation (min): 16 min  PT Assessment / Plan / Recommendation Clinical Impression    Pt admitted with lumbar fusion. Pt currently with functional limitations due to the deficits listed below (PT Problem List). Pt will benefit from skilled PT to increase their independence and safety with mobility to allow discharge to ST-SNF prior to return home.      PT Assessment  Patient needs continued PT services    Follow Up Recommendations  SNF    Does the patient have the potential to tolerate intense rehabilitation      Barriers to Discharge        Equipment Recommendations  Rolling walker with 5" wheels    Recommendations for Other Services     Frequency Min 5X/week    Precautions / Restrictions Precautions Precautions: Fall;Back Precaution Comments: No brace ordered. Restrictions Weight Bearing Restrictions: No   Pertinent Vitals/Pain Incisional pain.  Denies leg pain.      Mobility  Transfers Transfers: Sit to Stand;Stand to Sit Sit to Stand: 4: Min assist;With upper extremity assist;With armrests;From chair/3-in-1 Stand to Sit: 4: Min assist;With upper extremity assist;With armrests;To chair/3-in-1 Ambulation/Gait Ambulation/Gait Assistance: 4: Min assist Ambulation Distance (Feet): 125 Feet Assistive device: Rolling walker Ambulation/Gait Assistance Details: Frequent verbal cues to stay closer to walker. Gait Pattern: Step-through pattern;Decreased stride length;Trunk flexed Gait velocity: decr    Exercises     PT Diagnosis: Difficulty walking;Generalized weakness;Acute pain  PT Problem List: Decreased strength;Decreased balance;Decreased mobility;Decreased knowledge of use of DME;Decreased knowledge of precautions;Pain PT Treatment Interventions: DME instruction;Gait training;Patient/family education;Functional  mobility training;Therapeutic activities;Therapeutic exercise;Balance training   PT Goals Acute Rehab PT Goals PT Goal Formulation: With patient Time For Goal Achievement: 10/31/12 Potential to Achieve Goals: Good Pt will go Supine/Side to Sit: with supervision PT Goal: Supine/Side to Sit - Progress: Goal set today Pt will go Sit to Supine/Side: with supervision PT Goal: Sit to Supine/Side - Progress: Goal set today Pt will go Sit to Stand: with supervision PT Goal: Sit to Stand - Progress: Goal set today Pt will go Stand to Sit: with supervision PT Goal: Stand to Sit - Progress: Goal set today Pt will Ambulate: >150 feet;with supervision PT Goal: Ambulate - Progress: Goal set today  Visit Information  Last PT Received On: 10/24/12 Assistance Needed: +1    Subjective Data  Subjective: Pt states she feels she needs rehab before she goes home since she lives alone. Patient Stated Goal: Go to rehab and then home   Prior Functioning  Home Living Lives With: Alone Available Help at Discharge: Family;Available PRN/intermittently Type of Home: House Home Layout: One level Home Adaptive Equipment: Straight cane Prior Function Level of Independence: Independent with assistive device(s) Able to Take Stairs?: Yes Vocation: Retired Musician: No difficulties    Copywriter, advertising Arousal/Alertness: Awake/alert Behavior During Therapy: WFL for tasks assessed/performed Overall Cognitive Status: Within Functional Limits for tasks assessed    Extremity/Trunk Assessment Right Lower Extremity Assessment RLE ROM/Strength/Tone: Deficits RLE ROM/Strength/Tone Deficits: grossly 4/5 Left Lower Extremity Assessment LLE ROM/Strength/Tone: Deficits LLE ROM/Strength/Tone Deficits: grossly 4/5   Balance Balance Balance Assessed: Yes Static Standing Balance Static Standing - Balance Support: Bilateral upper extremity supported Static Standing - Level of Assistance: 4:  Min assist  End of Session PT - End of Session Equipment Utilized During Treatment: Gait belt Activity Tolerance: Patient tolerated treatment well Patient left: in chair;with  call bell/phone within reach;with family/visitor present Nurse Communication: Mobility status  GP     [facility] 10/24/2012, 11:17 AM  [facility] PT [phone]

## 2012-10-24 NOTE — Progress Notes (Signed)
Patient ID: Sherry Brooks, female   DOB: 1928-07-30, 79 y.o.   MRN: [id number] Subjective: Patient reports she is doing well. She has a sore throat and the intubation but has very little back or leg pain.  Objective: Vital signs in last 24 hours: Temp:  [97.4 F (36.3 C)-98.4 F (36.9 C)] 98.4 F (36.9 C) (05/24 0735) Pulse Rate:  [43-90] 69 (05/24 0800) Resp:  [12-21] 16 (05/24 0800) BP: (83-175)/(35-71) 108/46 mmHg (05/24 0800) SpO2:  [96 %-100 %] 96 % (05/24 0800) Weight:  [96.5 kg (212 lb 11.9 oz)] 96.5 kg (212 lb 11.9 oz) (05/23 1920)  Intake/Output from previous day: 05/23 0701 - 05/24 0700 In: 3920 [I.V.:3620; IV Piggyback:300] Out: 1080 [Urine:780; Blood:300] Intake/Output this shift:    Neurologic: Grossly normal  Lab Results: Lab Results  Component Value Date   WBC 7.7 10/19/2012   HGB 13.4 10/19/2012   HCT 38.8 10/19/2012   MCV 85.7 10/19/2012   PLT 284 10/19/2012   No results found for this basename: INR, PROTIME   BMET Lab Results  Component Value Date   NA 140 10/19/2012   K 4.2 10/19/2012   CL 102 10/19/2012   CO2 25 10/19/2012   GLUCOSE 102* 10/19/2012   BUN 28* 10/19/2012   CREATININE 1.07 10/19/2012   CALCIUM 9.9 10/19/2012    Studies/Results: Dg Lumbar Spine 2-3 Views  10/23/2012   *RADIOLOGY REPORT*  Clinical Data: L4-L5 PLIF  DG C-ARM 61-120 MIN,LUMBAR SPINE - 2-3 VIEW  Comparison:  No similar prior study is available for comparison.  Findings: Two intraoperative fluoroscopic images demonstrate posterior interbody fusion spanning L4 and L5.  No evidence for hardware failure.  IMPRESSION:  Intraoperative imaging demonstrating posterior L4-L5 fusion as above.   Original Report Authenticated By: Christiana Pellant, M.D.   Dg C-arm 61-120 Min  10/23/2012   *RADIOLOGY REPORT*  Clinical Data: L4-L5 PLIF  DG C-ARM 61-120 MIN,LUMBAR SPINE - 2-3 VIEW  Comparison:  No similar prior study is available for comparison.  Findings: Two intraoperative fluoroscopic images  demonstrate posterior interbody fusion spanning L4 and L5.  No evidence for hardware failure.  IMPRESSION:  Intraoperative imaging demonstrating posterior L4-L5 fusion as above.   Original Report Authenticated By: Christiana Pellant, M.D.    Assessment/Plan: She seems to doing quite well. I will transfer to the floor today and mobilize with therapy.   LOS: 1 day    Braidon Chermak S 10/24/2012, 9:27 AM

## 2012-10-25 LAB — URINALYSIS, ROUTINE W REFLEX MICROSCOPIC
Bilirubin Urine: NEGATIVE
Glucose, UA: NEGATIVE mg/dL
Hgb urine dipstick: NEGATIVE
Specific Gravity, Urine: 1.012 (ref 1.005–1.030)
pH: 5.5 (ref 5.0–8.0)

## 2012-10-25 MED ORDER — ACETAMINOPHEN 325 MG PO TABS
650.0000 mg | ORAL_TABLET | ORAL | Status: DC | PRN
  Administered 2012-10-25 – 2012-10-26 (×2): 650 mg via ORAL
  Filled 2012-10-25 (×2): qty 2

## 2012-10-25 NOTE — Progress Notes (Signed)
Patient ID: Sherry Brooks, female   DOB: 03/05/1929, 79 y.o.   MRN: [id number] Subjective:  The patient is alert and pleasant. She complains of appropriate back soreness.  Objective: Vital signs in last 24 hours: Temp:  [98.1 F (36.7 C)-99.3 F (37.4 C)] 99.3 F (37.4 C) (05/25 0600) Pulse Rate:  [76-98] 98 (05/25 0600) Resp:  [15-18] 18 (05/25 0600) BP: (90-172)/(47-74) 172/68 mmHg (05/25 0600) SpO2:  [94 %-99 %] 95 % (05/25 0600)  Intake/Output from previous day: 05/24 0701 - 05/25 0700 In: 500 [P.O.:240; I.V.:160; IV Piggyback:100] Out: 150 [Urine:150] Intake/Output this shift:    Physical exam the patient is alert and oriented. She is moving her lower extremities well.  Lab Results: No results found for this basename: WBC, HGB, HCT, PLT,  in the last 72 hours BMET No results found for this basename: NA, K, CL, CO2, GLUCOSE, BUN, CREATININE, CALCIUM,  in the last 72 hours  Studies/Results: Dg Lumbar Spine 2-3 Views  10/23/2012   *RADIOLOGY REPORT*  Clinical Data: L4-L5 PLIF  DG C-ARM 61-120 MIN,LUMBAR SPINE - 2-3 VIEW  Comparison:  No similar prior study is available for comparison.  Findings: Two intraoperative fluoroscopic images demonstrate posterior interbody fusion spanning L4 and L5.  No evidence for hardware failure.  IMPRESSION:  Intraoperative imaging demonstrating posterior L4-L5 fusion as above.   Original Report Authenticated By: Christiana Pellant, M.D.   Dg C-arm 61-120 Min  10/23/2012   *RADIOLOGY REPORT*  Clinical Data: L4-L5 PLIF  DG C-ARM 61-120 MIN,LUMBAR SPINE - 2-3 VIEW  Comparison:  No similar prior study is available for comparison.  Findings: Two intraoperative fluoroscopic images demonstrate posterior interbody fusion spanning L4 and L5.  No evidence for hardware failure.  IMPRESSION:  Intraoperative imaging demonstrating posterior L4-L5 fusion as above.   Original Report Authenticated By: Christiana Pellant, M.D.    Assessment/Plan: Postop day #2: The  patient is doing well. We will mobilize her.  LOS: 2 days     Lajuanna Pompa D 10/25/2012, 9:25 AM

## 2012-10-25 NOTE — Progress Notes (Signed)
Pt voiding 60-54ml every 15 minutes. MD notified. Order for bladder scan given. Bladder scan revealed . Pt temp 100.6 MD re contacted. Order for foley, UA and culture given.

## 2012-10-26 LAB — GLUCOSE, CAPILLARY: Glucose-Capillary: 117 mg/dL — ABNORMAL HIGH (ref 70–99)

## 2012-10-26 LAB — URINE CULTURE
Colony Count: NO GROWTH
Culture: NO GROWTH

## 2012-10-26 MED ORDER — INSULIN ASPART 100 UNIT/ML ~~LOC~~ SOLN
0.0000 [IU] | Freq: Three times a day (TID) | SUBCUTANEOUS | Status: DC
  Administered 2012-10-26: 3 [IU] via SUBCUTANEOUS
  Administered 2012-10-29 (×2): 2 [IU] via SUBCUTANEOUS

## 2012-10-26 NOTE — Care Management Note (Signed)
CARE MANAGEMENT NOTE 10/26/2012  Patient:  Sherry Brooks, Sherry Brooks   Account Number:  [id number]  Date Initiated:  10/26/2012  Documentation initiated by:  Vance Peper  Subjective/Objective Assessment:   79 yr old female s/p L4-5 posterior lumbar fusion.     Action/Plan:   Per Therapy notes patient will need shortterm SNF.   Anticipated DC Date:  10/27/2012   Anticipated DC Plan:  SKILLED NURSING FACILITY  In-house referral  Clinical Social Worker      DC Planning Services  CM consult      Choice offered to / List presented to:             Status of service:  Completed, signed off Medicare Important Message given?   (If response is "NO", the following Medicare IM given date fields will be blank) Date Medicare IM given:   Date Additional Medicare IM given:    Discharge Disposition:  SKILLED NURSING FACILITY  Per UR Regulation:    If discussed at Long Length of Stay Meetings, dates discussed:    Comments:

## 2012-10-26 NOTE — Progress Notes (Signed)
Patient ID: Sherry Brooks, female   DOB: 08/26/1928, 79 y.o.   MRN: [id number] Subjective:  The patient is alert and pleasant. Her back is still sore.  Objective: Vital signs in last 24 hours: Temp:  [97.9 F (36.6 C)-99.5 F (37.5 C)] 99.1 F (37.3 C) (05/26 0200) Pulse Rate:  [84] 84 (05/26 0200) Resp:  [16] 16 (05/26 0200) BP: (119-140)/(49-59) 135/59 mmHg (05/26 0200) SpO2:  [97 %-98 %] 97 % (05/26 0200)  Intake/Output from previous day: 05/25 0701 - 05/26 0700 In: 3 [I.V.:3] Out: -  Intake/Output this shift:    Physical exam the patient is alert and oriented. Her strength is grossly normal in her lower extremities.  Lab Results: No results found for this basename: WBC, HGB, HCT, PLT,  in the last 72 hours BMET No results found for this basename: NA, K, CL, CO2, GLUCOSE, BUN, CREATININE, CALCIUM,  in the last 72 hours  Studies/Results: No results found.  Assessment/Plan: Postop day #3: We will continue to mobilize the patient.  LOS: 3 days     Dominique Calvey D 10/26/2012, 9:51 AM

## 2012-10-26 NOTE — Progress Notes (Signed)
Physical Therapy Treatment Patient Details Name: Sherry Brooks MRN: [id number] DOB: 1928-10-02 Today's Date: 10/26/2012 Time: 4098-1191 PT Time Calculation (min): 24 min  PT Assessment / Plan / Recommendation Comments on Treatment Session  Patient eager to ambulate and glad to be out of bed. Having some difficulty walking upright with RW. Will continue to progress with quality of gait and endurance    Follow Up Recommendations  SNF     Does the patient have the potential to tolerate intense rehabilitation     Barriers to Discharge        Equipment Recommendations  Rolling walker with 5" wheels    Recommendations for Other Services    Frequency Min 5X/week   Plan Discharge plan remains appropriate;Frequency remains appropriate    Precautions / Restrictions Precautions Precautions: Fall;Back Precaution Comments: No brace ordered.   Pertinent Vitals/Pain 210 back pain. RN aware. Patient declined meds earlier today    Mobility  Bed Mobility Bed Mobility: Rolling Left;Left Sidelying to Sit Rolling Left: 4: Min assist;With rail Left Sidelying to Sit: 4: Min assist;With rails Details for Bed Mobility Assistance: A to ensure log roll technique Transfers Sit to Stand: With upper extremity assist;With armrests;From chair/3-in-1;3: Mod assist Stand to Sit: 4: Min assist;With upper extremity assist;With armrests;To chair/3-in-1 Details for Transfer Assistance: Cues for safe hand placement. A to initate stand and to shift weight anteriorly over BOS Ambulation/Gait Ambulation/Gait Assistance: 4: Min assist Ambulation Distance (Feet): 120 Feet Assistive device: Rolling walker Ambulation/Gait Assistance Details: Cues to stay closer into RW. Patient with short shuffle steps and far away from RW with excessive flexion of trunk Gait Pattern: Step-through pattern;Decreased stride length;Trunk flexed    Exercises     PT Diagnosis:    PT Problem List:   PT Treatment Interventions:      PT Goals Acute Rehab PT Goals PT Goal: Supine/Side to Sit - Progress: Progressing toward goal PT Goal: Sit to Stand - Progress: Progressing toward goal PT Goal: Stand to Sit - Progress: Progressing toward goal PT Goal: Ambulate - Progress: Progressing toward goal  Visit Information  Last PT Received On: 10/26/12 Assistance Needed: +1    Subjective Data      Cognition  Cognition Arousal/Alertness: Awake/alert Behavior During Therapy: WFL for tasks assessed/performed Overall Cognitive Status: Within Functional Limits for tasks assessed    Balance     End of Session PT - End of Session Equipment Utilized During Treatment: Gait belt Activity Tolerance: Patient tolerated treatment well Patient left: in chair;with call bell/phone within reach;with family/visitor present Nurse Communication: Mobility status   GP     Fredrich Birks 10/26/2012, 12:07 PM 10/26/2012 Fredrich Birks PTA [phone] pager [phone] office

## 2012-10-26 NOTE — Evaluation (Signed)
Occupational Therapy Evaluation Patient Details Name: Sherry Brooks MRN: [id number] DOB: Dec 24, 1928 Today's Date: 10/26/2012 Time: 4098-1191 OT Time Calculation (min): 27 min  OT Assessment / Plan / Recommendation Clinical Impression   79 yo female s/p PLIF L4-5 that could benefit from skilled OT due to balance, visual and safety deficits. Pt with poor recall to precautions. Recommend SNF at d/c    OT Assessment  Patient needs continued OT Services    Follow Up Recommendations  SNF    Barriers to Discharge      Equipment Recommendations  3 in 1 bedside comode;Other (comment) (RW)    Recommendations for Other Services    Frequency  Min 2X/week    Precautions / Restrictions Precautions Precautions: Fall;Back Precaution Comments: No brace ordered.   Pertinent Vitals/Pain 3 out 10    ADL  Eating/Feeding: Supervision/safety Where Assessed - Eating/Feeding: Chair Grooming: Teeth care;Minimal assistance Where Assessed - Grooming: Supported standing (LOB standing at sink) Lower Body Dressing: +1 Total assistance Where Assessed - Lower Body Dressing: Supported sitting Toilet Transfer: Moderate assistance Toilet Transfer Method: Sit to stand Toilet Transfer Equipment: Raised toilet seat with arms (or 3-in-1 over toilet) Toileting - Clothing Manipulation and Hygiene: Maximal assistance Where Assessed - Toileting Clothing Manipulation and Hygiene: Sit to stand from 3-in-1 or toilet Equipment Used: Rolling walker Transfers/Ambulation Related to ADLs: Pt ambulating to bathroom with decr gait velocity and poor positioning inside RW. Pt states "i always have trouble with this.' ADL Comments: Pt agreeable to toilet transfer to prepare for d/c of foley. Pt with LOB x2 during session with Max (A) to correct balance falling to the right. Pt reaching for environmental supports throughout session Pt with visual deficits noted during session "where is my chap stick?' pt feeling the inside  of sink looking for cap to tooth paste that OT removed. Pt states I heard it fall where is it? Pt using  tactile and auditory to compensate for visual deficits.   Pt asking for eye drops. Pt overshooting eyes to apply drops and needing more than one attempt.   OT Diagnosis: Generalized weakness;Acute pain  OT Problem List: Decreased strength;Decreased activity tolerance;Impaired balance (sitting and/or standing);Impaired vision/perception;Decreased cognition;Decreased safety awareness;Decreased knowledge of use of DME or AE;Decreased knowledge of precautions;Pain OT Treatment Interventions: Self-care/ADL training;DME and/or AE instruction;Therapeutic activities;Visual/perceptual remediation/compensation;Patient/family education;Balance training   OT Goals Acute Rehab OT Goals OT Goal Formulation: With patient Time For Goal Achievement: 11/09/12 Potential to Achieve Goals: Good ADL Goals Pt Will Perform Grooming: with supervision;Standing at sink;Unsupported ADL Goal: Grooming - Progress: Goal set today Pt Will Perform Upper Body Bathing: with set-up;Sitting, chair;Unsupported ADL Goal: Upper Body Bathing - Progress: Goal set today Pt Will Perform Upper Body Dressing: with set-up;Sitting, chair;Unsupported ADL Goal: Upper Body Dressing - Progress: Goal set today Pt Will Transfer to Toilet: with supervision;Ambulation;3-in-1 ADL Goal: Toilet Transfer - Progress: Goal set today Pt Will Perform Toileting - Clothing Manipulation: with supervision;Sitting on 3-in-1 or toilet ADL Goal: Toileting - Clothing Manipulation - Progress: Goal set today Pt Will Perform Toileting - Hygiene: with supervision;Sit to stand from 3-in-1/toilet ADL Goal: Toileting - Hygiene - Progress: Goal set today Miscellaneous OT Goals Miscellaneous OT Goal #1: Pt will complete bed mobility MIN (A) HOB < 20 degrees as precursor to adls OT Goal: Miscellaneous Goal #1 - Progress: Goal set today  Visit Information  Last OT  Received On: 10/26/12 Assistance Needed: +1    Subjective Data  Subjective: "I think I need to lay down"  Patient Stated Goal: to not feel like a child- pt describing ability to do adls right now   Prior Functioning     Home Living Lives With: Alone Available Help at Discharge: Family;Available PRN/intermittently Type of Home: House Additional Comments: plans to d/c snf Prior Function Level of Independence: Independent with assistive device(s) Able to Take Stairs?: Yes Vocation: Retired Musician: No difficulties Dominant Hand: Right         Vision/Perception Vision - History Baseline Vision: Wears glasses all the time Patient Visual Report: No change from baseline   Cognition  Cognition Arousal/Alertness: Awake/alert Behavior During Therapy: WFL for tasks assessed/performed Overall Cognitive Status: Impaired/Different from baseline Area of Impairment: Memory Memory: Decreased recall of precautions General Comments: Pt educated on back precautions - Pt recalling bending and reaching only. Pt with back handout in room from PTA session. Question patients ability to read handout text after visual deficits noted. OT to further assess next session    Extremity/Trunk Assessment Right Upper Extremity Assessment RUE ROM/Strength/Tone: Within functional levels RUE Sensation: WFL - Light Touch RUE Coordination: Deficits (dropping objects at sink ) Left Upper Extremity Assessment LUE ROM/Strength/Tone: Within functional levels LUE Sensation: WFL - Light Touch LUE Coordination: Deficits (dropping objects at sink)     Mobility Bed Mobility Bed Mobility: Sit to Supine Rolling Left: 4: Min assist;With rail Left Sidelying to Sit: 4: Min assist;With rails Sit to Supine: 2: Max assist;HOB flat Details for Bed Mobility Assistance: Pt with poor sequence of bed mobility with max cues and tactile input. Pt states "I am scared' pt attempting to descend directly  backward in bed with therapist educating to descend onto shoulder. Pt incorrectly attempting x2 with task redirected to fix error. Third attempt pt not lift bil LE so OT assisting total for LB and pt descending with UB. Pt repositioned total + 2  pt =10 % Transfers Transfers: Sit to Stand;Stand to Sit Sit to Stand: 2: Max assist;With upper extremity assist;From chair/3-in-1 Stand to Sit: 3: Mod assist;With upper extremity assist;To bed Details for Transfer Assistance: cues for hand placement      Exercise     Balance Static Standing Balance Static Standing - Balance Support: No upper extremity supported;During functional activity Static Standing - Level of Assistance: 4: Min assist   End of Session OT - End of Session Activity Tolerance: Patient tolerated treatment well Patient left: in bed;with call bell/phone within reach Nurse Communication: Mobility status;Precautions  GO     Lucile Shutters 10/26/2012, 2:11 PM Pager: [phone]

## 2012-10-26 NOTE — Progress Notes (Signed)
UR COMPLETED  

## 2012-10-27 LAB — GLUCOSE, CAPILLARY
Glucose-Capillary: 134 mg/dL — ABNORMAL HIGH (ref 70–99)
Glucose-Capillary: 91 mg/dL (ref 70–99)
Glucose-Capillary: 104 mg/dL — ABNORMAL HIGH (ref 70–99)

## 2012-10-27 MED ORDER — DIPHENHYDRAMINE HCL 25 MG PO CAPS
25.0000 mg | ORAL_CAPSULE | Freq: Four times a day (QID) | ORAL | Status: DC | PRN
  Administered 2012-10-28: 25 mg via ORAL
  Filled 2012-10-27 (×2): qty 1

## 2012-10-27 NOTE — Progress Notes (Signed)
Patient ID: Sherry Brooks, female   DOB: 04/30/1929, 79 y.o.   MRN: [id number] BP 139/56  Pulse 81  Temp(Src) 99 F (37.2 C) (Oral)  Resp 18  Ht 5\' 4"  (1.626 m)  Wt 96.5 kg (212 lb 11.9 oz)  BMI 36.5 kg/m2  SpO2 99% Alert and oriented x 4, speech is clear and fluent Moving all extremities well Wound is clean and dry.

## 2012-10-27 NOTE — Progress Notes (Signed)
Pt reports that she has not had a BM nor has she been able to eat a full meal since admission, stating that solid food gets "caught in my throat".  She reports that this happens "from time to time" at home also.  She said that benadryl helped once during a previous episode.  Will notify MD

## 2012-10-27 NOTE — Progress Notes (Signed)
Physical Therapy Treatment Patient Details Name: Sherry Brooks MRN: [id number] DOB: 06-Feb-1929 Today's Date: 10/27/2012 Time: 4098-1191 PT Time Calculation (min): 20 min  PT Assessment / Plan / Recommendation Comments on Treatment Session  Patient progressing better today. Unable to recall precautions. Continue with current POC    Follow Up Recommendations  SNF     Does the patient have the potential to tolerate intense rehabilitation     Barriers to Discharge        Equipment Recommendations  Rolling walker with 5" wheels    Recommendations for Other Services    Frequency Min 5X/week   Plan Discharge plan remains appropriate;Frequency remains appropriate    Precautions / Restrictions Precautions Precautions: Fall;Back Precaution Comments: No brace ordered.   Pertinent Vitals/Pain     Mobility  Bed Mobility Bed Mobility: Rolling Right;Right Sidelying to Sit Rolling Right: 3: Mod assist;With rail Right Sidelying to Sit: 4: Min assist;With rails Details for Bed Mobility Assistance: Patient needing cues to recall and maintain log roll technique. A to elevate trunk up out of bed.  Transfers Sit to Stand: 4: Min assist;With armrests;From chair/3-in-1;From bed Stand to Sit: To chair/3-in-1;4: Min assist Details for Transfer Assistance: cues for hand placement  Ambulation/Gait Ambulation/Gait Assistance: 4: Min guard Ambulation Distance (Feet): 125 Feet Assistive device: Rolling walker Ambulation/Gait Assistance Details: Patient ambulated much better with lowered RW. Cues to look forward and pick up feet. Gait Pattern: Step-through pattern;Decreased stride length;Trunk flexed    Exercises     PT Diagnosis:    PT Problem List:   PT Treatment Interventions:     PT Goals Acute Rehab PT Goals PT Goal: Supine/Side to Sit - Progress: Progressing toward goal PT Goal: Sit to Stand - Progress: Progressing toward goal PT Goal: Stand to Sit - Progress: Progressing toward  goal PT Goal: Ambulate - Progress: Progressing toward goal  Visit Information  Last PT Received On: 10/27/12 Assistance Needed: +1    Subjective Data      Cognition  Cognition Arousal/Alertness: Awake/alert Behavior During Therapy: WFL for tasks assessed/performed Overall Cognitive Status: Within Functional Limits for tasks assessed    Balance     End of Session PT - End of Session Equipment Utilized During Treatment: Gait belt Activity Tolerance: Patient tolerated treatment well Patient left: in chair;with call bell/phone within reach;with family/visitor present Nurse Communication: Mobility status   GP     Fredrich Birks 10/27/2012, 2:39 PM 10/27/2012 Fredrich Birks PTA [phone] pager [phone] office

## 2012-10-28 DIAGNOSIS — M4310 Spondylolisthesis, site unspecified: Secondary | ICD-10-CM

## 2012-10-28 LAB — GLUCOSE, CAPILLARY: Glucose-Capillary: 132 mg/dL — ABNORMAL HIGH (ref 70–99)

## 2012-10-28 MED ORDER — HYDROCODONE-ACETAMINOPHEN 5-325 MG PO TABS: 1.0000 | ORAL_TABLET | Freq: Four times a day (QID) | ORAL | Status: DC | PRN

## 2012-10-28 MED ORDER — CYCLOBENZAPRINE HCL 10 MG PO TABS: 10.0000 mg | ORAL_TABLET | Freq: Three times a day (TID) | ORAL | Status: DC | PRN

## 2012-10-28 NOTE — Progress Notes (Signed)
Clinical Social Work Department CLINICAL SOCIAL WORK PLACEMENT NOTE 10/28/2012  Patient:  Sherry Brooks, Sherry Brooks  Account Number:  [id number] Admit date:  10/23/2012  Clinical Social Worker:  Robin Searing  Date/time:  10/28/2012 01:34 PM  Clinical Social Work is seeking post-discharge placement for this patient at the following level of care:   SKILLED NURSING   (*CSW will update this form in Epic as items are completed)   10/28/2012  Patient/family provided with [facility] Department of Clinical Social Work's list of facilities offering this level of care within the geographic area requested by the patient (or if unable, by the patient's family).  10/28/2012  Patient/family informed of their freedom to choose among providers that offer the needed level of care, that participate in Medicare, Medicaid or managed care program needed by the patient, have an available bed and are willing to accept the patient.  10/28/2012  Patient/family informed of [facility]' ownership interest in [facility], as well as of the fact that they are under no obligation to receive care at this facility.  PASARR submitted to [employer] on 10/28/2012 PASARR number received from [employer] on 10/28/2012  FL2 transmitted to all facilities in geographic area requested by pt/family on  10/28/2012 FL2 transmitted to all facilities within larger geographic area on   Patient informed that his/her managed care company has contracts with or will negotiate with  certain facilities, including the following:   [employer]UE MEDICARE     Patient/family informed of bed offers received:   Patient chooses bed at  Physician recommends and patient chooses bed at    Patient to be transferred to  on   Patient to be transferred to facility by   The following physician request were entered in Epic:   Additional Comments: Reece Levy, MSW, Theresia Majors [phone]

## 2012-10-28 NOTE — Progress Notes (Signed)
Patient ID: Sherry Brooks, female   DOB: [birth year], 79 y.o.   MRN: [id number] BP 132/55  Pulse 80  Temp(Src) 99.2 F (37.3 C) (Oral)  Resp 18  Ht 5\' 4"  (1.626 m)  Wt 96.5 kg (212 lb 11.9 oz)  BMI 36.5 kg/m2  SpO2 99% Alert and oriented x 4 Speech is clear and fluent Moving all extremities well, wound is clean and dry Will remove foley snf tomorrow.

## 2012-10-28 NOTE — Progress Notes (Signed)
I agree with the following treatment note after reviewing documentation.   Johnston, Roshaun Pound Brynn   OTR/L Pager: [phone] Office: [phone] .   

## 2012-10-28 NOTE — Progress Notes (Signed)
Physical Therapy Treatment Patient Details Name: Sherry Brooks MRN: [id number] DOB: 05/13/1929 Today's Date: 10/28/2012 Time: 4098-1191 PT Time Calculation (min): 29 min  PT Assessment / Plan / Recommendation Comments on Treatment Session  Pt continues to progress with gait with RW. Pt requested to sit down after 75 '. plans for SNF.    Follow Up Recommendations  SNF     Does the patient have the potential to tolerate intense rehabilitation     Barriers to Discharge        Equipment Recommendations  Rolling walker with 5" wheels    Recommendations for Other Services    Frequency Min 5X/week   Plan Discharge plan remains appropriate;Frequency remains appropriate    Precautions / Restrictions Precautions Precautions: Fall;Back Precaution Comments: pt stated 2/3   Pertinent Vitals/Pain Only"stiff"    Mobility  Transfers Sit to Stand: 4: Min guard;From chair/3-in-1;With armrests;With upper extremity assist Stand to Sit: To chair/3-in-1;4: Min guard;With armrests;With upper extremity assist Details for Transfer Assistance: cues for hand placement , cues to turn completely around in front of chair before reaching for armrests. Ambulation/Gait Ambulation/Gait Assistance: 4: Min guard Ambulation Distance (Feet): 75 Feet (x 2 then 20 ' x 1) Assistive device: Rolling walker Ambulation/Gait Assistance Details: cues for upright posture , , increase  step lemngth Gait Pattern: Step-through pattern;Decreased stride length;Shuffle Gait velocity: decr    Exercises General Exercises - Lower Extremity Ankle Circles/Pumps: AROM;Both;20 reps Long Arc Quad: AROM;Both;10 reps Hip Flexion/Marching: AROM;Both;10 reps   PT Diagnosis:    PT Problem List:   PT Treatment Interventions:     PT Goals Acute Rehab PT Goals Pt will go Sit to Stand: with supervision PT Goal: Sit to Stand - Progress: Progressing toward goal Pt will go Stand to Sit: with supervision PT Goal: Stand to Sit -  Progress: Progressing toward goal Pt will Ambulate: >150 feet;with supervision PT Goal: Ambulate - Progress: Progressing toward goal  Visit Information  Last PT Received On: 10/28/12 Assistance Needed: +1    Subjective Data  Subjective: I am stiff   Cognition  Cognition Arousal/Alertness: Awake/alert    Balance     End of Session PT - End of Session Patient left: in chair;with call bell/phone within reach;   GP     Rada Hay 10/28/2012, 8:52 AM Blanchard Kelch PT [phone]

## 2012-10-28 NOTE — Progress Notes (Signed)
SNF bed offers provided to patient and she has accepted [facility]- updated [facility] rep for [facility]- will f/u tomorrow for ?d/c.  Patient pleased with this SNF-  Reece Levy, MSW, Theresia Majors [phone]

## 2012-10-28 NOTE — Progress Notes (Signed)
Occupational Therapy Treatment Patient Details Name: Sherry Brooks MRN: [id number] DOB: [birth year] Today's Date: 10/28/2012 Time: 4098-1191 OT Time Calculation (min): 30 min  OT Assessment / Plan / Recommendation Comments on Treatment Session 79 yo female s/p PLIF L4-5 that could benefit from skilled OT due to balance, visual and safety deficits. Found out today that Pt has macular degeneration (per Pt). Pt would benefit from continued ADL safety reinforcement and bed mobility maintaining precautions     Follow Up Recommendations  SNF       Equipment Recommendations  3 in 1 bedside comode;Other (comment) (RW)       Frequency Min 2X/week   Plan Discharge plan remains appropriate    Precautions / Restrictions Precautions Precautions: Fall;Back Precaution Comments: no back brace, Pt able to state 3/3 precautions Restrictions Weight Bearing Restrictions: No   Pertinent Vitals/Pain Pt reported 5/10 pain, RN giving medications at end of session    ADL  Grooming: Performed;Teeth care;Wash/dry face;Brushing hair;Set up Where Assessed - Grooming: Supported standing (BUE support and leaning against sink) Toilet Transfer: Simulated;Minimal assistance Toilet Transfer Method: Sit to Barista: Raised toilet seat with arms (or 3-in-1 over toilet) Equipment Used: Gait belt;Rolling walker Transfers/Ambulation Related to ADLs: Pt ambulating at decr rate but improved safety staying in walker. Pt transfers sit <> stand with supervision and good awareness to precautions ADL Comments: Pt had to be encouraged to participate but Pt performed ADL with set up at sink level.     OT Goals Acute Rehab OT Goals OT Goal Formulation: With patient Time For Goal Achievement: 11/09/12 Potential to Achieve Goals: Good ADL Goals Pt Will Perform Grooming: with supervision;Standing at sink;Unsupported ADL Goal: Grooming - Progress: Progressing toward goals Pt Will Perform Upper  Body Bathing: with set-up;Sitting, chair;Unsupported Pt Will Perform Upper Body Dressing: with set-up;Sitting, chair;Unsupported Pt Will Transfer to Toilet: with supervision;Ambulation;3-in-1 Pt Will Perform Toileting - Clothing Manipulation: with supervision;Sitting on 3-in-1 or toilet Pt Will Perform Toileting - Hygiene: with supervision;Sit to stand from 3-in-1/toilet Miscellaneous OT Goals Miscellaneous OT Goal #1: Pt will complete bed mobility MIN (A) HOB < 20 degrees as precursor to adls OT Goal: Miscellaneous Goal #1 - Progress: Progressing toward goals  Visit Information  Last OT Received On: 10/28/12 Assistance Needed: +1          Cognition  Cognition Arousal/Alertness: Awake/alert Behavior During Therapy: [facility] for tasks assessed/performed Overall Cognitive Status: Within Functional Limits for tasks assessed General Comments: Pt able to recall 3/3 precautions.    Mobility  Bed Mobility Bed Mobility: Rolling Right;Right Sidelying to Sit;Sit to Sidelying Right Rolling Right: 4: Min guard;With rail Right Sidelying to Sit: 5: Supervision;With rails;HOB flat Sit to Sidelying Right: 1: +2 Total assist;HOB flat;With rail Sit to Sidelying Right: Patient Percentage: 60% Details for Bed Mobility Assistance: Pt states that the hardest part is getting her legs into bed. Pt much better at sitting up in bed rather than laying down. Pt needed mod verbal cues for log rolling once on side. Transfers Transfers: Sit to Stand;Stand to Sit Sit to Stand: 4: Min guard;From chair/3-in-1;With armrests;With upper extremity assist Stand to Sit: 4: Min guard;With upper extremity assist;With armrests;To bed;To chair/3-in-1 Details for Transfer Assistance: cues for hand placement , cues to turn completely around in front of chair before reaching for armrests.    Exercises  General Exercises - Lower Extremity Ankle Circles/Pumps: AROM;Both;20 reps Long Arc Quad: AROM;Both;10 reps Hip  Flexion/Marching: AROM;Both;10 reps      End of  Session OT - End of Session Equipment Utilized During Treatment: Gait belt;Other (comment) (RW) Activity Tolerance: Patient tolerated treatment well Patient left: in chair;with call bell/phone within reach;with family/visitor present;with nursing in room Nurse Communication: Mobility status;Precautions  GO     Sherry Brooks 10/28/2012, 11:30 AM

## 2012-10-28 NOTE — Progress Notes (Signed)
Clinical Social Work Department BRIEF PSYCHOSOCIAL ASSESSMENT 10/28/2012  Patient:  Sherry Brooks, Sherry Brooks     Account Number:  [id number]     Admit date:  10/23/2012  Clinical Social Worker:  Robin Searing  Date/Time:  10/28/2012 01:24 PM  Referred by:  Physician  Date Referred:  10/28/2012 Referred for  SNF Placement   Other Referral:   Interview type:  Patient Other interview type:   Patient is interested in ST SNF for rehab- she lives alone in [place] and would prefer something in that area-    PSYCHOSOCIAL DATA Living Status:  ALONE Admitted from facility:   Level of care:   Primary support name:  [name]- daughter in law Primary support relationship to patient:  FAMILY Degree of support available:   good    CURRENT CONCERNS Current Concerns  Post-Acute Placement   Other Concerns:    SOCIAL WORK ASSESSMENT / PLAN Patient admitted from home and will need ST SNF at d/c-   Assessment/plan status:  Other - See comment Other assessment/ plan:   Information/referral to community resources:   SNF    PATIENT'S/FAMILY'S RESPONSE TO PLAN OF CARE: she is agreeable to SNF search in [facility]-  Will also pursue [employer] auth for SNF   Reece Levy, MSW, [employer] [phone]

## 2012-10-28 NOTE — Discharge Summary (Signed)
Physician Discharge Summary  Patient ID: Sherry Brooks MRN: [id number] DOB/AGE: 77/06/1928 77 y.o.  Admit date: 10/23/2012 Discharge date: 10/28/2012  Admission Diagnoses:spondylolisthesis lumbar stenosis lumbar radiculopathy L4/5   Discharge Diagnoses: spondylolisthesis lumbar stenosis lumbar radiculopathy L4/5  Active Problems:   Spondylolisthesis   Discharged Condition: good  Hospital Course: Mrs. Sole was admitted and taken to the hospital where she underwent a lumbar decompression and fusion with pedicle screw fixation using Nuvasive hardware. Post op she has had some urinary hesistancy, and the expected pain. She is ambulating, tolerating a regular diet, and experiencing less pain in the lower extremities.   Consults: rehab  Significant Diagnostic Studies: none  Treatments: surgery: POSTERIOR LUMBAR FUSION 1 LEVEL, Posterolateral arthrodesis L4/5 morselized allograft, autograft 11mm x 23mm peek cages x 2 Medial to lateal pedicle screw fixation, non segmental, nuvasive 5.32mmx35mm screws in L4 and L5.    Discharge Exam: Blood pressure 132/55, pulse 80, temperature 99.2 F (37.3 C), temperature source Oral, resp. rate 18, height 5\' 4"  (1.626 m), weight 96.5 kg (212 lb 11.9 oz), SpO2 99.00%. General appearance: alert, cooperative, appears stated age and no distress Neurologic: Alert and oriented X 3, normal strength and tone. Normal symmetric reflexes. Normal coordination and gait  Disposition: 01-Home or Self Care  Discharge Orders   Future Orders Complete By Expires     For home use only DME Bedside commode  As directed     Walker rolling  As directed         Medication List    STOP taking these medications       ibuprofen 200 MG tablet  Commonly known as:  ADVIL,MOTRIN      TAKE these medications       acetaminophen 500 MG tablet  Commonly known as:  TYLENOL  Take 1,000 mg by mouth 3 (three) times daily as needed for pain.     ALPRAZolam 0.5 MG  tablet  Commonly known as:  XANAX  Take 0.25 mg by mouth daily.     b complex vitamins tablet  Take 1 tablet by mouth daily.     calcium carbonate 750 MG chewable tablet  Commonly known as:  TUMS EX  Chew 1 tablet by mouth daily as needed for heartburn.     cholecalciferol 1000 UNITS tablet  Commonly known as:  VITAMIN D  Take 1,000 Units by mouth daily.     cyclobenzaprine 10 MG tablet  Commonly known as:  FLEXERIL  Take 1 tablet (10 mg total) by mouth 3 (three) times daily as needed for muscle spasms.     HYDROcodone-acetaminophen 5-325 MG per tablet  Commonly known as:  NORCO/VICODIN  Take 1 tablet by mouth every 6 (six) hours as needed for pain.     HYDROcodone-acetaminophen 5-325 MG per tablet  Commonly known as:  NORCO/VICODIN  Take 1 tablet by mouth every 6 (six) hours as needed for pain.     lisinopril-hydrochlorothiazide 20-25 MG per tablet  Commonly known as:  PRINZIDE,ZESTORETIC  Take 1 tablet by mouth daily after breakfast.     METAMUCIL PO  Take 1 capsule by mouth daily.     OCUVITE PO  Take 1 tablet by mouth daily. For eyes.     OVER THE COUNTER MEDICATION  Take 1 tablet by mouth daily. Omega Q plus     VITAMIN E PO  Take 1 tablet by mouth daily.         Signed: Deannah Rossi L 10/28/2012, 6:41 PM

## 2012-10-29 LAB — GLUCOSE, CAPILLARY
Glucose-Capillary: 125 mg/dL — ABNORMAL HIGH (ref 70–99)
Glucose-Capillary: 131 mg/dL — ABNORMAL HIGH (ref 70–99)

## 2012-10-29 NOTE — Progress Notes (Signed)
Physical Therapy Treatment Patient Details Name: Keyoni Lapinski MRN: [id number] DOB: Jul 16, 1928 Today's Date: 10/29/2012 Time: 1240-1305 PT Time Calculation (min): 25 min  PT Assessment / Plan / Recommendation Comments on Treatment Session  pt assisted to stretcher for trasport to [facility].  Reinforced all back care and prec's with pt and daughter.    Follow Up Recommendations  SNF     Does the patient have the potential to tolerate intense rehabilitation     Barriers to Discharge        Equipment Recommendations  Rolling walker with 5" wheels    Recommendations for Other Services    Frequency Min 5X/week   Plan Discharge plan remains appropriate;Frequency remains appropriate    Precautions / Restrictions Precautions Precautions: Fall;Back Precaution Comments: no back brace, Pt able to state 3/3 precautions Restrictions Weight Bearing Restrictions: No   Pertinent Vitals/Pain     Mobility  Bed Mobility Bed Mobility: Not assessed Transfers Transfers: Sit to Stand;Stand to Sit Sit to Stand: 4: Min guard;From chair/3-in-1;With armrests;With upper extremity assist Stand to Sit: 4: Min guard;With upper extremity assist;With armrests;To bed;To chair/3-in-1 Details for Transfer Assistance: cues for hand placement , cues to turn completely around in front of chair before reaching for armrests. Ambulation/Gait Ambulation/Gait Assistance: 4: Min guard Ambulation Distance (Feet): 70 Feet (times 2) Assistive device: Rolling walker Ambulation/Gait Assistance Details: vc's for improved use of the RW and postural checks Gait Pattern: Step-through pattern;Decreased stride length;Shuffle Gait velocity: decr    Exercises     PT Diagnosis:    PT Problem List:   PT Treatment Interventions:     PT Goals Acute Rehab PT Goals Time For Goal Achievement: 10/31/12 Potential to Achieve Goals: Good Pt will go Supine/Side to Sit: with supervision Pt will go Sit to  Supine/Side: with supervision Pt will go Sit to Stand: with supervision PT Goal: Sit to Stand - Progress: Progressing toward goal Pt will go Stand to Sit: with supervision PT Goal: Stand to Sit - Progress: Progressing toward goal Pt will Ambulate: >150 feet;with supervision PT Goal: Ambulate - Progress: Progressing toward goal  Visit Information  Last PT Received On: 10/29/12 Assistance Needed: +1    Subjective Data  Subjective: So I've got to remember to stay in the walker Patient Stated Goal: Go to rehab and then home   Cognition  Cognition Arousal/Alertness: Awake/alert Behavior During Therapy: [facility] for tasks assessed/performed Overall Cognitive Status: Within Functional Limits for tasks assessed General Comments: Pt able to recall 3/3 precautions.    Balance     End of Session PT - End of Session Activity Tolerance: Patient tolerated treatment well Patient left: in chair;with call bell/phone within reach;with family/visitor present Nurse Communication: Mobility status   GP     Brayln Duque, Eliseo Gum 10/29/2012, 1:12 PM 10/29/2012  [place] Bing, PT [phone]  (pager)

## 2012-10-29 NOTE — Progress Notes (Signed)
Clinical Social Work Department CLINICAL SOCIAL WORK PLACEMENT NOTE 10/29/2012  Patient:  Sherry Brooks, Sherry Brooks  Account Number:  [id number] Admit date:  10/23/2012  Clinical Social Worker:  Robin Searing  Date/time:  10/28/2012 01:34 PM  Clinical Social Work is seeking post-discharge placement for this patient at the following level of care:   SKILLED NURSING   (*CSW will update this form in Epic as items are completed)   10/28/2012  Patient/family provided with [facility] Department of Clinical Social Work's list of facilities offering this level of care within the geographic area requested by the patient (or if unable, by the patient's family).  10/28/2012  Patient/family informed of their freedom to choose among providers that offer the needed level of care, that participate in Medicare, Medicaid or managed care program needed by the patient, have an available bed and are willing to accept the patient.  10/28/2012  Patient/family informed of [facility]' ownership interest in [facility], as well as of the fact that they are under no obligation to receive care at this facility.  PASARR submitted to EDS on 10/28/2012 PASARR number received from EDS on 10/28/2012  FL2 transmitted to all facilities in geographic area requested by pt/family on  10/28/2012 FL2 transmitted to all facilities within larger geographic area on   Patient informed that his/her managed care company has contracts with or will negotiate with  certain facilities, including the following:   BLUE MEDICARE     Patient/family informed of bed offers received:  10/28/2012 Patient chooses bed at [facility] Physician recommends and patient chooses bed at    Patient to be transferred to [facility] on  10/29/2012 Patient to be transferred to facility by ems  The following physician request were entered in Epic:   Additional Comments: Reece Levy, MSW, Theresia Majors [phone]

## 2013-03-31 ENCOUNTER — Ambulatory Visit: Payer: Self-pay | Admitting: Podiatry

## 2013-06-30 ENCOUNTER — Encounter: Payer: Self-pay | Admitting: Podiatry

## 2013-06-30 ENCOUNTER — Ambulatory Visit (INDEPENDENT_AMBULATORY_CARE_PROVIDER_SITE_OTHER): Payer: Medicare Other | Admitting: Podiatry

## 2013-06-30 VITALS — BP 153/78 | HR 75 | Resp 16 | Ht 64.0 in | Wt 200.0 lb

## 2013-06-30 DIAGNOSIS — M79609 Pain in unspecified limb: Secondary | ICD-10-CM

## 2013-06-30 DIAGNOSIS — B351 Tinea unguium: Secondary | ICD-10-CM

## 2013-06-30 NOTE — Progress Notes (Signed)
She presents today with a chief complaint of painful toenails one through 5 bilateral.  Objective: Pulses are palpable bilateral nails are thick yellow dystrophic clinically mycotic and painful on palpation bilateral.  Assessment: Pain in limb secondary to onychomycosis 1 through 5 bilateral.  Plan: Debridement of nails 1 through 5 bilateral is cover service associated with pain. Followup with her in 3 months

## 2013-09-01 ENCOUNTER — Ambulatory Visit (INDEPENDENT_AMBULATORY_CARE_PROVIDER_SITE_OTHER): Payer: Medicare Other | Admitting: Podiatry

## 2013-09-01 ENCOUNTER — Encounter: Payer: Self-pay | Admitting: Podiatry

## 2013-09-01 VITALS — BP 116/72 | HR 98 | Resp 18

## 2013-09-01 DIAGNOSIS — M79609 Pain in unspecified limb: Secondary | ICD-10-CM

## 2013-09-01 DIAGNOSIS — B351 Tinea unguium: Secondary | ICD-10-CM

## 2013-09-01 NOTE — Progress Notes (Signed)
She presents today with a chief complaint of painful E. elongated toenails.  Objective: Vital signs are stable she is alert and oriented x3. Nails are thick yellow dystrophic onychomycotic and painful palpation.  Assessment: In limb secondary to onychomycosis 1 through 5 bilateral.  Plan: Debridement of nails 1 through 5 bilateral covered service secondary to pain.

## 2013-09-29 ENCOUNTER — Ambulatory Visit: Payer: Medicare Other | Admitting: Podiatry

## 2013-10-19 DIAGNOSIS — M7070 Other bursitis of hip, unspecified hip: Secondary | ICD-10-CM | POA: Insufficient documentation

## 2014-01-04 DIAGNOSIS — M2550 Pain in unspecified joint: Secondary | ICD-10-CM | POA: Insufficient documentation

## 2014-05-17 ENCOUNTER — Ambulatory Visit (INDEPENDENT_AMBULATORY_CARE_PROVIDER_SITE_OTHER): Payer: Medicare Other | Admitting: Podiatry

## 2014-05-17 DIAGNOSIS — B351 Tinea unguium: Secondary | ICD-10-CM

## 2014-05-17 DIAGNOSIS — M79676 Pain in unspecified toe(s): Secondary | ICD-10-CM

## 2014-05-17 NOTE — Progress Notes (Signed)
Patient ID: Kerri PerchesIngeborg L Fugett, female   DOB: [birth year], 80 y.o.   MRN: [id number]  Subjective: 80 year old female returns the office today for painful elongated toenails. She states that she like her nails trimmed since that way she is not an ingrown toenail. She denies any recent redness or drainage from around the nail sites. The nails are painful particularly with shoe gear and pressure. No other complaints at this time in no acute changes since last appointment.  Objective: AAO 3, NAD DP/PT pulses palpable bilaterally, CRT less than 3 seconds Slight decrease with Simms Weinstein monofilament, vibratory sensation intact, Achilles tendon reflex intact Nails hypertrophic, dystrophic, elongated, brittle, discolored 10. No surrounding erythema or drainage around the nail sites. No open lesions or pre-ulcerative lesions. No areas of pinpoint bony tenderness or pain with vibratory sensation. No pain with calf compression, swelling, warmth, erythema  Assessment: 80 year old female with symptomatic onychomycosis  Plan: -Treatment options were discussed including alternatives, risks, complications -Nail sharply debrided 10 without complications to patient comfort. -Discussed the importance of daily foot inspection -Follow-up in 3 months or sooner if any problems should arise. In the meantime, call the office with any questions, concerns, change in symptoms.

## 2014-07-26 ENCOUNTER — Ambulatory Visit: Payer: Self-pay | Admitting: Family Medicine

## 2014-07-26 LAB — BASIC METABOLIC PANEL
BUN: 37 mg/dL — AB (ref 4–21)
Creatinine: 1.2 mg/dL — AB (ref 0.5–1.1)
Glucose: 137 mg/dL
SODIUM: 140 mmol/L (ref 137–147)

## 2014-07-26 LAB — CBC AND DIFFERENTIAL
Neutrophils Absolute: 4 /uL
WBC: 7.3 10*3/mL

## 2014-07-26 LAB — TSH: TSH: 4.23 u[IU]/mL (ref 0.41–5.90)

## 2014-08-23 LAB — HEMOGLOBIN A1C: Hgb A1c MFr Bld: 5.7 % (ref 4.0–6.0)

## 2014-09-16 ENCOUNTER — Ambulatory Visit
Admit: 2014-09-16 | Disposition: A | Discharge status: Home / Self Care | Payer: Self-pay | Attending: Family Medicine | Admitting: Family Medicine

## 2014-09-16 LAB — URINALYSIS, COMPLETE
BILIRUBIN, UR: NEGATIVE
BLOOD: NEGATIVE
GLUCOSE, UR: NEGATIVE
KETONE: NEGATIVE
Nitrite: NEGATIVE
PH: 7 (ref 5.0–8.0)
Protein: NEGATIVE
Specific Gravity: 1.015 (ref 1.000–1.030)

## 2014-10-11 ENCOUNTER — Ambulatory Visit: Payer: Self-pay | Admitting: Podiatry

## 2014-10-17 ENCOUNTER — Encounter: Payer: Self-pay | Admitting: Podiatry

## 2014-10-17 ENCOUNTER — Ambulatory Visit (INDEPENDENT_AMBULATORY_CARE_PROVIDER_SITE_OTHER): Payer: Medicare Other | Admitting: Podiatry

## 2014-10-17 DIAGNOSIS — M79676 Pain in unspecified toe(s): Secondary | ICD-10-CM | POA: Diagnosis not present

## 2014-10-17 DIAGNOSIS — B351 Tinea unguium: Secondary | ICD-10-CM

## 2014-10-17 NOTE — Progress Notes (Signed)
Patient presents to the office today with a chief complaint of painful elongated toenails.  Objective: Pulses are palpable bilateral. Nails are thick yellow dystrophic clinically mycotic and painful palpation.  Assessment: Pain in limb secondary to onychomycosis 1 through 5 bilateral.  Plan: Debridement of nails 1 through 5 bilateral covered service secondary to pain.  

## 2015-02-02 ENCOUNTER — Telehealth: Payer: Self-pay | Admitting: Family Medicine

## 2015-02-02 NOTE — Telephone Encounter (Signed)
Pt's daughter in law [name] called to get pt's CPE scheduled. [name] stated that she didn't want to wait until December that she felt pt needed her CPE before then. [name] wanted to know is there anyway Dr. Elease Hashimoto could work her in after 03/24/15 but before December. Pt's last CPE was 03/23/14. Please advise. Thanks TNP

## 2015-02-02 NOTE — Telephone Encounter (Signed)
Apt made for 03/28/2015 at 11AM  Thanks,   -Vernona Rieger

## 2015-02-10 ENCOUNTER — Encounter: Payer: Self-pay | Admitting: Emergency Medicine

## 2015-02-10 ENCOUNTER — Emergency Department
Admission: EM | Admit: 2015-02-10 | Discharge: 2015-02-10 | Disposition: A | Discharge status: Home / Self Care | Payer: Medicare Other | Attending: Emergency Medicine | Admitting: Emergency Medicine

## 2015-02-10 ENCOUNTER — Telehealth: Payer: Self-pay | Admitting: Cardiovascular Disease

## 2015-02-10 ENCOUNTER — Ambulatory Visit
Admission: EM | Admit: 2015-02-10 | Discharge: 2015-02-10 | Disposition: A | Discharge status: Hospice | Payer: Medicare Other | Attending: Family Medicine | Admitting: Family Medicine

## 2015-02-10 ENCOUNTER — Emergency Department: Payer: Medicare Other

## 2015-02-10 DIAGNOSIS — F419 Anxiety disorder, unspecified: Secondary | ICD-10-CM | POA: Diagnosis not present

## 2015-02-10 DIAGNOSIS — R300 Dysuria: Secondary | ICD-10-CM | POA: Diagnosis present

## 2015-02-10 DIAGNOSIS — R0789 Other chest pain: Secondary | ICD-10-CM

## 2015-02-10 DIAGNOSIS — Z79899 Other long term (current) drug therapy: Secondary | ICD-10-CM | POA: Diagnosis not present

## 2015-02-10 DIAGNOSIS — R350 Frequency of micturition: Secondary | ICD-10-CM | POA: Diagnosis not present

## 2015-02-10 DIAGNOSIS — I440 Atrioventricular block, first degree: Secondary | ICD-10-CM | POA: Diagnosis not present

## 2015-02-10 DIAGNOSIS — I10 Essential (primary) hypertension: Secondary | ICD-10-CM | POA: Diagnosis present

## 2015-02-10 DIAGNOSIS — Z79811 Long term (current) use of aromatase inhibitors: Secondary | ICD-10-CM | POA: Insufficient documentation

## 2015-02-10 DIAGNOSIS — R079 Chest pain, unspecified: Secondary | ICD-10-CM

## 2015-02-10 DIAGNOSIS — R600 Localized edema: Secondary | ICD-10-CM

## 2015-02-10 DIAGNOSIS — R0602 Shortness of breath: Secondary | ICD-10-CM | POA: Diagnosis not present

## 2015-02-10 DIAGNOSIS — I159 Secondary hypertension, unspecified: Secondary | ICD-10-CM | POA: Diagnosis not present

## 2015-02-10 DIAGNOSIS — I491 Atrial premature depolarization: Secondary | ICD-10-CM

## 2015-02-10 LAB — COMPREHENSIVE METABOLIC PANEL
ALBUMIN: 4 g/dL (ref 3.5–5.0)
ALK PHOS: 70 U/L (ref 38–126)
ALT: 18 U/L (ref 14–54)
AST: 23 U/L (ref 15–41)
Anion gap: 8 (ref 5–15)
BILIRUBIN TOTAL: 0.3 mg/dL (ref 0.3–1.2)
BUN: 20 mg/dL (ref 6–20)
CALCIUM: 9.8 mg/dL (ref 8.9–10.3)
CO2: 29 mmol/L (ref 22–32)
CREATININE: 0.9 mg/dL (ref 0.44–1.00)
Chloride: 104 mmol/L (ref 101–111)
GFR calc Af Amer: >60 mL/min (ref >60)
GFR, EST NON AFRICAN AMERICAN: 57 mL/min — AB (ref >60)
GLUCOSE: 100 mg/dL — AB (ref 65–99)
Potassium: 3.7 mmol/L (ref 3.5–5.1)
Sodium: 141 mmol/L (ref 135–145)
TOTAL PROTEIN: 6.9 g/dL (ref 6.5–8.1)

## 2015-02-10 LAB — URINALYSIS COMPLETE WITH MICROSCOPIC (ARMC ONLY)
BACTERIA UA: NONE SEEN — AB
BILIRUBIN URINE: NEGATIVE
GLUCOSE, UA: NEGATIVE mg/dL
HGB URINE DIPSTICK: NEGATIVE
Ketones, ur: NEGATIVE mg/dL
Leukocytes, UA: NEGATIVE
NITRITE: NEGATIVE
Protein, ur: NEGATIVE mg/dL
RBC / HPF: NONE SEEN RBC/hpf (ref <3)
SPECIFIC GRAVITY, URINE: 1.02 (ref 1.005–1.030)
WBC UA: NONE SEEN WBC/hpf (ref <3)
pH: 7.5 (ref 5.0–8.0)

## 2015-02-10 LAB — CBC WITH DIFFERENTIAL/PLATELET
BASOS ABS: 0 10*3/uL (ref 0–0.1)
BASOS PCT: 0 %
Eosinophils Absolute: 0 10*3/uL (ref 0–0.7)
Eosinophils Relative: 0 %
HEMATOCRIT: 41.6 % (ref 35.0–47.0)
HEMOGLOBIN: 13.4 g/dL (ref 12.0–16.0)
LYMPHS PCT: 31 %
Lymphs Abs: 2 10*3/uL (ref 1.0–3.6)
MCH: 28.1 pg (ref 26.0–34.0)
MCHC: 32.2 g/dL (ref 32.0–36.0)
MCV: 87.1 fL (ref 80.0–100.0)
MONOS PCT: 7 %
Monocytes Absolute: 0.4 10*3/uL (ref 0.2–0.9)
NEUTROS ABS: 3.8 10*3/uL (ref 1.4–6.5)
NEUTROS PCT: 62 %
Platelets: 273 10*3/uL (ref 150–440)
RBC: 4.77 MIL/uL (ref 3.80–5.20)
RDW: 13.9 % (ref 11.5–14.5)
WBC: 6.3 10*3/uL (ref 3.6–11.0)

## 2015-02-10 LAB — TROPONIN I
Troponin I: <0.03 ng/mL (ref <0.031)
Troponin I: <0.03 ng/mL (ref <0.031)

## 2015-02-10 MED ORDER — PHENAZOPYRIDINE HCL 100 MG PO TABS: 100.0000 mg | ORAL_TABLET | Freq: Three times a day (TID) | ORAL | Status: DC | PRN

## 2015-02-10 MED ORDER — LORAZEPAM 1 MG PO TABS
ORAL_TABLET | ORAL | Status: DC
Start: 2015-02-10 — End: 2015-02-11
  Filled 2015-02-10: qty 1

## 2015-02-10 MED ORDER — LORAZEPAM 1 MG PO TABS
1.0000 mg | ORAL_TABLET | Freq: Once | ORAL | Status: AC
  Administered 2015-02-10: 1 mg via ORAL

## 2015-02-10 NOTE — Discharge Instructions (Signed)

## 2015-02-10 NOTE — ED Notes (Signed)
Bed readjusted for comfort.

## 2015-02-10 NOTE — ED Notes (Signed)
Pt's visitor remains very concerned regarding wait for admission or discharge. 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 Pt is less anxious.

## 2015-02-10 NOTE — ED Notes (Signed)
Pt walked to bathroom to urinate 

## 2015-02-10 NOTE — ED Notes (Signed)
Pt very anxious, pt focusing on cardiac monitor. Dr. Pershing Proud notified, order for po ativan received.

## 2015-02-10 NOTE — ED Notes (Signed)
Dr. Pershing Proud notified regarding pt's blood pressure of 203/82. No new orders received. Pt very anxious regarding blood pressure. Pt and friend state "she should just stay here, can you get her a room to monitor her blood pressure?"

## 2015-02-10 NOTE — ED Provider Notes (Signed)
CSN: [id number]     Arrival date & time 02/10/15  1054 History   None    Chief Complaint  Patient presents with  . Dysuria   (Consider location/radiation/quality/duration/timing/severity/associated sxs/prior Treatment) HPI Comments: Widowed caucasian female here for evaluation of low abdomen discomfort, decreased appetite, urinary frequency.  Patient with macular degeneration doesn't have good vision/doesn't drive.  Friend brought her today.  Lives alone.  Has had overactive bladder for many years per patient but has worsened past two days thinks she has UTI. Getting up 5 times per night to urinate.  Denied changes in smell.  Hx chronic low back pain left sx 2014 uses cane.  Chest tightness past two days and feeling blah.  Took all medications as prescribed unsure if she takes BP med in am or at bedtime as friend sets up her medication boxes.  Soft brown stool daily with metamucil use.  Last stool this am.  Ate cereal for breakfast this am.  States leg swelling worse than normal today thighs tight today TED hose on bilateral legs.  Sweating in bed last night didn't take temperature felt like a fever.  Hx colon surgery, bilateral total knee replacements right 1999 left 2002.  Used to exercise at [facility] but has not recently due to no longer driving.  Trying to exercise at home in chair but less active than last year.  Per care everywhere in [facility] was evaluated by [facility] March 2016 echocardiogram/EF good for age 74%.  Patient is a 81 y.o. female presenting with dysuria. The history is provided by the patient and a friend.  Dysuria Pain quality:  Burning Pain severity:  Moderate Onset quality:  Sudden Duration:  2 days Timing:  Intermittent Progression:  Unchanged Chronicity:  Recurrent Recent urinary tract infections: yes   Relieved by:  Nothing Urinary symptoms: frequent urination   Urinary symptoms: no discolored urine, no foul-smelling urine, no hematuria, no hesitancy and no bladder  incontinence   Associated symptoms: abdominal pain and fever   Associated symptoms: no flank pain, no genital lesions, no nausea, no vaginal discharge and no vomiting   Abdominal pain:    Location:  Suprapubic   Quality:  Pressure   Severity:  Moderate   Onset quality:  Sudden   Duration:  2 days   Timing:  Intermittent   Progression:  Unchanged   Chronicity:  Recurrent Fever:    Duration:  1 day   Timing:  Intermittent   Temp source:  Subjective   Progression:  Resolved Risk factors: recurrent urinary tract infections   Risk factors: no hx of pyelonephritis, no hx of urolithiasis, no kidney transplant, not pregnant, no renal cysts, no renal disease, not sexually active, not single kidney and no sexually transmitted infections     Past Medical History  Diagnosis Date  . Macular degeneration   . Complication of anesthesia     wakes up slowly, takes longer than usual to wake  . History of stress test     10 yrs. ago, states it was normal , had been ordered for pt. due to stress related to husband's illness,, by Dr. Vic Ripper  . Hypertension   . Peripheral vascular disease     blood clots in legs mid 2000's   . Renal calculus     history of > 50 yrs. ago  . GERD (gastroesophageal reflux disease)     on occas. uses TUMS for indigestion   . Arthritis     knees  .  Borderline diabetic    Past Surgical History  Procedure Laterality Date  . Joint replacement      both knees   . Colon surgery      due to diverticulitis- had colostomy, then takedown   . Eye surgery      cataracts removed - /w IOL  . Fracture surgery      L wrist, plate in place  . Appendectomy      as a teenager    History reviewed. No pertinent family history. Social History  Substance Use Topics  . Smoking status: Never Smoker   . Smokeless tobacco: None  . Alcohol Use: Yes     Comment: glass of wine, rare occas.    OB History    No data available     Review of Systems  Constitutional:  Positive for fever, appetite change and fatigue. Negative for chills, diaphoresis and activity change.  HENT: Negative for congestion, dental problem, drooling, ear discharge, ear pain, facial swelling, hearing loss, mouth sores, nosebleeds, postnasal drip, rhinorrhea, sinus pressure, sneezing, sore throat, tinnitus, trouble swallowing and voice change.   Eyes: Negative for photophobia, pain, discharge, redness, itching and visual disturbance.  Respiratory: Positive for chest tightness. Negative for cough, choking, shortness of breath, wheezing and stridor.   Cardiovascular: Positive for leg swelling. Negative for chest pain and palpitations.  Gastrointestinal: Positive for abdominal pain. Negative for nausea, vomiting, diarrhea, constipation, blood in stool, abdominal distention, anal bleeding and rectal pain.  Endocrine: Negative for cold intolerance and heat intolerance.  Genitourinary: Positive for dysuria, urgency and frequency. Negative for hematuria, flank pain, decreased urine volume, vaginal discharge, enuresis, difficulty urinating, genital sores and pelvic pain.  Musculoskeletal: Positive for myalgias, back pain, arthralgias and gait problem. Negative for joint swelling, neck pain and neck stiffness.  Skin: Negative for color change, pallor, rash and wound.  Allergic/Immunologic: Negative for environmental allergies and food allergies.  Neurological: Negative for dizziness, tremors, seizures, syncope, facial asymmetry, speech difficulty, weakness, light-headedness, numbness and headaches.  Hematological: Negative for adenopathy. Does not bruise/bleed easily.  Psychiatric/Behavioral: Positive for sleep disturbance. Negative for behavioral problems, confusion and agitation.    Allergies  Review of patient's allergies indicates no known allergies.  Home Medications   Prior to Admission medications   Medication Sig Start Date End Date Taking? Authorizing Provider  acetaminophen  (TYLENOL) 500 MG tablet Take 1,000 mg by mouth 3 (three) times daily as needed for pain.    Historical Provider, MD  ALPRAZolam Prudy Feeler) 0.5 MG tablet Take 0.25 mg by mouth daily.     Historical Provider, MD  b complex vitamins tablet Take 1 tablet by mouth daily.    Historical Provider, MD  calcium carbonate (TUMS EX) 750 MG chewable tablet Chew 1 tablet by mouth daily as needed for heartburn.    Historical Provider, MD  chlorhexidine (PERIDEX) 0.12 % solution  08/09/13   Historical Provider, MD  cholecalciferol (VITAMIN D) 1000 UNITS tablet Take 1,000 Units by mouth daily.    Historical Provider, MD  LASTACAFT 0.25 % SOLN  06/29/13   Historical Provider, MD  lisinopril-hydrochlorothiazide (PRINZIDE,ZESTORETIC) 20-25 MG per tablet Take 1 tablet by mouth daily after breakfast.     Historical Provider, MD  Multiple Vitamins-Minerals (OCUVITE PO) Take 1 tablet by mouth daily. For eyes.    Historical Provider, MD  OVER THE COUNTER MEDICATION Take 1 tablet by mouth daily. Omega Q plus    Historical Provider, MD  PATADAY 0.2 % SOLN  06/08/13  Historical Provider, MD  phenazopyridine (PYRIDIUM) 100 MG tablet Take 1 tablet (100 mg total) by mouth 3 (three) times daily as needed for pain. 02/10/15   Barbaraann Barthel, NP  pravastatin (PRAVACHOL) 10 MG tablet  08/26/13   Historical Provider, MD  Psyllium (METAMUCIL PO) Take 1 capsule by mouth daily.    Historical Provider, MD  traMADol Janean Sark) 50 MG tablet  07/28/13   Historical Provider, MD  triamcinolone cream (KENALOG) 0.1 %  07/26/13   Historical Provider, MD  VITAMIN E PO Take 1 tablet by mouth daily.    Historical Provider, MD   Meds Ordered and Administered this Visit  Medications - No data to display  BP 184/83 mmHg  Pulse 64  Temp(Src) 97.6 F (36.4 C) (Oral)  Resp 16  Ht 5\' 3"  (1.6 m)  Wt 210 lb (95.255 kg)  BMI 37.21 kg/m2  SpO2 100% No data found.   Physical Exam  Constitutional: She is oriented to person, place, and time. Vital signs are  normal. She appears well-developed and well-nourished. No distress.  HENT:  Head: Normocephalic and atraumatic.  Right Ear: Hearing, external ear and ear canal normal. A middle ear effusion is present.  Left Ear: Hearing, external ear and ear canal normal. A middle ear effusion is present.  Nose: Nose normal. No mucosal edema, rhinorrhea, nose lacerations, sinus tenderness, nasal deformity, septal deviation or nasal septal hematoma. No epistaxis.  No foreign bodies. Right sinus exhibits no maxillary sinus tenderness and no frontal sinus tenderness. Left sinus exhibits no maxillary sinus tenderness and no frontal sinus tenderness.  Mouth/Throat: Uvula is midline, oropharynx is clear and moist and mucous membranes are normal. Mucous membranes are not pale, not dry and not cyanotic. She does not have dentures. No oral lesions. No trismus in the jaw. Normal dentition. No dental abscesses, uvula swelling, lacerations or dental caries. No oropharyngeal exudate, posterior oropharyngeal edema, posterior oropharyngeal erythema or tonsillar abscesses.  Bilateral TMs with air fluid level clear  Eyes: Conjunctivae, EOM and lids are normal. Pupils are equal, round, and reactive to light. Right eye exhibits no discharge. Left eye exhibits no discharge. No scleral icterus.  Neck: Trachea normal and normal range of motion. Neck supple. No tracheal deviation present.  Cardiovascular: Normal rate, regular rhythm, S1 normal, S2 normal, normal heart sounds, intact distal pulses and normal pulses.  PMI is not displaced.  Exam reveals no gallop, no distant heart sounds, no friction rub and no decreased pulses.   No murmur heard. Pulses:      Dorsalis pedis pulses are 2+ on the right side, and 2+ on the left side.  Bilateral lower extremities 2+/4 pitting edema with ted hose on bilateral lower legs; TTP  Pulmonary/Chest: Effort normal and breath sounds normal. No accessory muscle usage or stridor. No respiratory distress.  She has no decreased breath sounds. She has no wheezes. She has no rhonchi. She has no rales.  Abdominal: Soft. Bowel sounds are normal. She exhibits no shifting dullness, no distension, no pulsatile liver, no fluid wave, no abdominal bruit, no ascites, no pulsatile midline mass and no mass. There is no hepatosplenomegaly. There is tenderness in the suprapubic area. There is no rigidity, no rebound, no guarding, no CVA tenderness, no tenderness at McBurney's point and negative Murphy's sign. Hernia confirmed negative in the ventral area.  Dull to percussion x 4 quads  Musculoskeletal: Normal range of motion. She exhibits edema and tenderness.       Right hip: Normal.  Left hip: Normal.       Right knee: Normal.       Left knee: Normal.       Right ankle: She exhibits swelling. She exhibits normal range of motion, no ecchymosis, no deformity, no laceration and normal pulse. Achilles tendon normal.       Left ankle: She exhibits swelling. She exhibits normal range of motion, no ecchymosis, no deformity, no laceration and normal pulse. Achilles tendon normal.       Lumbar back: She exhibits pain. She exhibits normal range of motion, no tenderness, no bony tenderness, no swelling, no edema, no deformity, no laceration, no spasm and normal pulse.       Back:       Right upper leg: She exhibits tenderness, swelling and edema. She exhibits no bony tenderness, no deformity and no laceration.       Left upper leg: She exhibits tenderness, swelling and edema. She exhibits no bony tenderness, no deformity and no laceration.       Right lower leg: She exhibits tenderness, swelling and edema. She exhibits no bony tenderness, no deformity and no laceration.       Left lower leg: She exhibits tenderness, swelling and edema. She exhibits no bony tenderness, no deformity and no laceration.  Utilizes cane for ambulation slow and steady in hall to scale and bathroom multiple times  Lymphadenopathy:    She has  no cervical adenopathy.  Neurological: She is alert and oriented to person, place, and time. She displays no atrophy and no tremor. No cranial nerve deficit or sensory deficit. She exhibits normal muscle tone. She displays no seizure activity. Coordination and gait normal. GCS eye subscore is 4. GCS verbal subscore is 5. GCS motor subscore is 6.  Skin: Skin is warm, dry and intact. No abrasion, no burn, no laceration, no lesion, no petechiae and no rash noted. She is not diaphoretic. No cyanosis or erythema. No pallor. Nails show no clubbing.  Psychiatric: She has a normal mood and affect. Her speech is normal and behavior is normal. Judgment and thought content normal. Cognition and memory are normal.  Nursing note and vitals reviewed.   ED Course  Procedures (including critical care time)  Labs Review Labs Reviewed  URINALYSIS COMPLETEWITH MICROSCOPIC ([facility] ONLY) - Abnormal; Notable for the following:    Bacteria, UA NONE SEEN (*)    Squamous Epithelial / LPF 0-5 (*)    All other components within normal limits    Imaging Review No results found.  1300 discussed urinalysis results with patient and given copy of results.  EKG ordered and repeat vital signs.  Patient weight 210 lbs with clothes and shoes on.  Patient anxious that she has missed lunch and ride had to leave her.  She is going to have to call friend or daughter in law to come and get her  1340 Discussed EKG results with patient.  Blood pressure remains elevated. Case discussed with Dr Hassan Rowan.  Will transfer to ER for AMI r/o serial troponins.  Patient friend arrived to clinic for transport and brought back to exam room.  1350 Report called to [facility] ED Brandy RN charge nurse.  Patient anxiety decreased with friend at her side agreed to POV transport to [facility] ER for further evaluation.  Discussed with patient concerned with new PACs, chest tightness, elevated blood pressure (usual 130s/60s), bilateral leg edema to thighs 2+  pitting.  Patient given copy of previous 4 EKGs located in [place]  to take with her to ED.  Patient and friend verbalized understanding of information/instructions, agreed with plan of care and had no further questions at this time.  MDM   1. Increased urinary frequency   2. First degree AV block   3. PAC (premature atrial contraction)   4. Essential hypertension   5. Bilateral leg edema   6. Tightness in chest    sinus rhythm 1st degree AV block with PACs. PACs new compared to 3 previous EKGs from 2014/2016.  Had stress test/echo Mar 2016 [facility] due to angina after death of spouse normal for age.  Chest tightness today and worsening hypertension/pitting leg edema to [facility] ED for troponins serial and r/o CHF  Case discussed with Dr Thurmond Butts.  Official read pending with cardiology service. Continue wearing TED hose and elevate legs when sitting. See MUSE.  Patient verbalized agreement and understanding of treatment plan and transported via POV by friend to ER VSS 184/83 and pulse 64.  Patient anxiety decreased when friend arrived as she was alone at beginning of clinic visit.  Continue current medications as directed.  Continue to monitor blood pressure at home and maintain log of blood pressure and pulse to bring to follow up appointments.  Continue low sodium diet and exercise program.  Recommended weight loss/weight maintenance to BMI 20-25.  Return to the clinic if any new symptoms.  Patient verbalized agreement and understanding of treatment plan and had no further questions at this time.   P2:  Diet and Exercise specific for HTN  Urinalysis normal.  Urine culture pending will call once results available typically 48 hours.  Discussed kegel exercises, possible bladder irritation from macular degeneration vitamins/medications as patient  Appetite decreased/hot weather possible dehydration earlier this week but not currently.   Patient is also to push fluids and may use Pyridium 100mg  po TID as  needed.  Hydrate, avoid dehydration.  Avoid holding urine void on frequent basis every 2 hours while awake.  If unable to void every 8 hours follow up for re-evaluation with PCM, urgent care or ER.   Call or return to clinic as needed if these symptoms worsen or fail to improve as anticipated.  Exitcare handout on overactive bladder and kegel exercises given to patient Patient verbalized agreement and understanding of treatment plan and had no further questions at this time. P2:  Misty Stanley, NP 02/10/15 1459

## 2015-02-10 NOTE — ED Notes (Signed)
Pt states was sent over to ed by urgent care for "high blood pressure". Pt states did have "c hest tightness" last pm. Pt denies pain currently, denies nausea, denies dizziness, denies shob. Skin pwd. Pt anxious states "we have been waiting for over seven hours here".

## 2015-02-10 NOTE — ED Notes (Signed)
Pt and friend very concerned regarding wait time for md evaluation. Pt and friend have asked approx 6 times since arrival to ed room "when will the doctor be here?" explanation of md evaluation time explained multiple times. Extra warm blankets provided. Pt denies pain currently. Call bell at side.

## 2015-02-10 NOTE — ED Notes (Signed)
Dr. schaevitz in to speak with pt.  

## 2015-02-10 NOTE — Discharge Instructions (Signed)
Kegel Exercises The goal of Kegel exercises is to isolate and exercise your pelvic floor muscles. These muscles act as a hammock that supports the rectum, vagina, small intestine, and uterus. As the muscles weaken, the hammock sags and these organs are displaced from their normal positions. Kegel exercises can strengthen your pelvic floor muscles and help you to improve bladder and bowel control, improve sexual response, and help reduce many problems and some discomfort during pregnancy. Kegel exercises can be done anywhere and at any time. HOW TO PERFORM KEGEL EXERCISES  Locate your pelvic floor muscles. To do this, squeeze (contract) the muscles that you use when you try to stop the flow of urine. You will feel a tightness in the vaginal area (women) and a tight lift in the rectal area (men and women).  When you begin, contract your pelvic muscles tight for 2-5 seconds, then relax them for 2-5 seconds. This is one set. Do 4-5 sets with a short pause in between.  Contract your pelvic muscles for 8-10 seconds, then relax them for 8-10 seconds. Do 4-5 sets. If you cannot contract your pelvic muscles for 8-10 seconds, try 5-7 seconds and work your way up to 8-10 seconds. Your goal is 4-5 sets of 10 contractions each day. Keep your stomach, buttocks, and legs relaxed during the exercises. Perform sets of both short and long contractions. Vary your positions. Perform these contractions 3-4 times per day. Perform sets while you are:   Lying in bed in the morning.  Standing at lunch.  Sitting in the late afternoon.  Lying in bed at night. You should do 40-50 contractions per day. Do not perform more Kegel exercises per day than recommended. Overexercising can cause muscle fatigue. Continue these exercises for for at least 15-20 weeks or as directed by your caregiver. Document Released: 05/06/2012 Document Reviewed: 05/06/2012 [facility] Patient Information 2015 [place], [place]. This information is not  intended to replace advice given to you by your health care provider. Make sure you discuss any questions you have with your health care provider. Overactive Bladder The bladder has two functions that are totally opposite of the other. One is to relax and stretch out so it can store urine (fills like a balloon), and the other is to contract and squeeze down so that it can empty the urine that it has stored. Proper functioning of the bladder is a complex mixing of these two functions. The filling and emptying of the bladder can be influenced by:  The bladder.  The spinal cord.  The brain.  The nerves going to the bladder.  Other organs that are closely related to the bladder such as prostate in males and the vagina in females. As your bladder fills with urine, nerve signals are sent from the bladder to the brain to tell you that you may need to urinate. Normal urination requires that the bladder squeeze down with sufficient strength to empty the bladder, but this also requires that the bladder squeeze down sufficiently long to finish the job. In addition the sphincter muscles, which normally keep you from leaking urine, must also relax so that the urine can pass. Coordination between the bladder muscle squeezing down and the sphincter muscles relaxing is required to make everything happen normally. With an overactive bladder sometimes the muscles of the bladder contract unexpectedly and involuntarily and this causes an urgent need to urinate. The normal response is to try to hold urine in by contracting the sphincter muscles. Sometimes the bladder contracts  so strongly that the sphincter muscles cannot stop the urine from passing out and incontinence occurs. This kind of incontinence is called urge incontinence. Having an overactive bladder can be embarrassing and awkward. It can keep you from living life the way you want to. Many people think it is just something you have to put up with as you grow  older or have certain health conditions. In fact, there are treatments that can help make your life easier and more pleasant. CAUSES  Many things can cause an overactive bladder. Possibilities include:  Urinary tract infection or infection of nearby tissues such as the prostate.  Prostate enlargement.  In women, multiple pregnancies or surgery on the uterus or urethra.  Bladder stones, inflammation, or tumors.  Caffeine.  Alcohol.  Medications. For example, diuretics (drugs that help the body get rid of extra fluid) increase urine production. Some other medicines must be taken with lots of fluids.  Muscle or nerve weakness. This might be the result of a spinal cord injury, a stroke, multiple sclerosis, or Parkinson disease.  Diabetes can cause a high urine volume which fills the bladder so quickly that the normal urge to urinate is triggered very strongly. SYMPTOMS   Loss of bladder control. You feel the need to urinate and cannot make your body wait.  Sudden, strong urges to urinate.  Urinating 8 or more times a day.  Waking up to urinate two or more times a night. DIAGNOSIS  To decide if you have overactive bladder, your health care provider will probably:  Ask about symptoms you have noticed.  Ask about your overall health. This will include questions about any medications you are taking.  Do a physical examination. This will help determine if there are obvious blockages or other problems.  Order some tests. These might include:  A blood test to check for diabetes or other health issues that could be contributing to the problem.  Urine testing. This could measure the flow of urine and the pressure on the bladder.  A test of your neurological system (the brain, spinal cord, and nerves). This is the system that senses the need to urinate. Some of these tests are called flow tests, bladder pressure tests, and electrical measurements of the sphincter muscle.  A bladder  test to check whether it is emptying completely when you urinate.  Cystoscopy. This test uses a thin tube with a tiny camera on it. It offers a look inside your urethra and bladder to see if there are problems.  Imaging tests. You might be given a contrast dye and then asked to urinate. X-rays are taken to see how your bladder is working. TREATMENT  An overactive bladder can be treated in many ways. The treatment will depend on the cause. Whether you have a mild or severe case also makes a difference. Often, treatment can be given in your health care provider's office or clinic. Be sure to discuss the different options with your caregiver. They include:  Behavioral treatments. These do not involve medication or surgery:  Bladder training. For this, you would follow a schedule to urinate at regular intervals. This helps you learn to control the urge to urinate. At first, you might be asked to wait a few minutes after feeling the urge. In time, you should be able to schedule bathroom visits an hour or more apart.  Kegel exercises. These exercises strengthen the pelvic floor muscles, which support the bladder. Toning these muscles can help control urination even if  the bladder muscles are overactive. A specialist will teach you how to do these exercises correctly. They will require daily practice.  Weight loss. If you are obese or overweight, losing weight might stop your bladder from being overactive. Talk to your health care provider about how many pounds you should lose. Also ask if there is a specific program or method that would work best for you.  Diet change. This might be suggested if constipation is making your overactive bladder worse. Your health care provider or a nutritionist can explain ways to change what you eat to ease constipation. Other people might need to take in less caffeine or alcohol. Sometimes drinking fewer fluids is needed, too.  Protection. This is not an actual treatment.  But, you could wear special pads to take care of any leakage while you wait for other treatments to take effect. This will help you avoid embarrassment.  Physical treatments.  Electrical stimulation. Electrodes will send gentle pulses to the nerves or muscles that help control the bladder. The goal is to strengthen them. Sometimes this is done with the electrodes outside the body. Or, they might be placed inside the body (implanted). This treatment can take several months to have an effect.  Medications. These are usually used along with other treatments. Several medicines are available. Some are injected into the muscles involved in urination. Others come in pill form. Medications sometimes prescribed include:  Anticholinergics. These drugs block the signals that the nerves deliver to the bladder. This keeps it from releasing urine at the wrong time. Researchers think the drugs might help in other ways, too.  Imipramine. This is an antidepressant. But, it relaxes bladder muscles.  Botox. This is still experimental. Some people believe that injecting it into the bladder muscles will relax them so they work more normally. It has also been injected into the sphincter muscle when the sphincter muscle does not open properly. This is a temporary fix, however. Also, it might make matters worse, especially in older people.  Surgery.  A device might be implanted to help manage your nerves. It works on the nerves that signal when you need to urinate.  Surgery is sometimes needed with electrical stimulation. If the electrodes are implanted, this is done through surgery.  Sometimes repairs need to be made through surgery. For example, the size of the bladder can be changed. This is usually done in severe cases only. HOME CARE INSTRUCTIONS   Take any medications your health care provider prescribed or suggested. Follow the directions carefully.  Practice any lifestyle changes that are recommended. These  might include:  Drinking less fluid or drinking at different times of the day. If you need to urinate often during the night, for example, you may need to stop drinking fluids early in the evening.  Cutting down on caffeine or alcohol. They can both make an overactive bladder worse. Caffeine is found in coffee, tea, and sodas.  Doing Kegel exercises to strengthen muscles.  Losing weight, if that is recommended.  Eating a healthy and balanced diet. This will help you avoid constipation.  Keep a journal or a log. You might be asked to record how much you drink and when, and also when you feel the need to urinate.  Learn how to care for implants or other devices, such as pessaries. SEEK MEDICAL CARE IF:   Your overactive bladder gets worse.  You feel increased pain or irritation when you urinate.  You notice blood in your urine.  You have questions about any medications or devices that your health care provider recommended.  You notice blood, pus, or swelling at the site of any test or treatment procedure.  You have an oral temperature above 102F (38.9C). SEEK IMMEDIATE MEDICAL CARE IF:  You have an oral temperature above 102F (38.9C), not controlled by medicine. Document Released: 03/16/2009 Document Revised: 10/04/2013 Document Reviewed: 03/16/2009 [facility] Patient Information 2015 [place], [place]. This information is not intended to replace advice given to you by your health care provider. Make sure you discuss any questions you have with your health care provider. Chest Pain (Nonspecific) It is often hard to give a specific diagnosis for the cause of chest pain. There is always a chance that your pain could be related to something serious, such as a heart attack or a blood clot in the lungs. You need to follow up with your health care provider for further evaluation. CAUSES   Heartburn.  Pneumonia or bronchitis.  Anxiety or stress.  Inflammation around your heart  (pericarditis) or lung (pleuritis or pleurisy).  A blood clot in the lung.  A collapsed lung (pneumothorax). It can develop suddenly on its own (spontaneous pneumothorax) or from trauma to the chest.  Shingles infection (herpes zoster virus). The chest wall is composed of bones, muscles, and cartilage. Any of these can be the source of the pain.  The bones can be bruised by injury.  The muscles or cartilage can be strained by coughing or overwork.  The cartilage can be affected by inflammation and become sore (costochondritis). DIAGNOSIS  Lab tests or other studies may be needed to find the cause of your pain. Your health care provider may have you take a test called an ambulatory electrocardiogram (ECG). An ECG records your heartbeat patterns over a 24-hour period. You may also have other tests, such as:  Transthoracic echocardiogram (TTE). During echocardiography, sound waves are used to evaluate how blood flows through your heart.  Transesophageal echocardiogram (TEE).  Cardiac monitoring. This allows your health care provider to monitor your heart rate and rhythm in real time.  Holter monitor. This is a portable device that records your heartbeat and can help diagnose heart arrhythmias. It allows your health care provider to track your heart activity for several days, if needed.  Stress tests by exercise or by giving medicine that makes the heart beat faster. TREATMENT   Treatment depends on what may be causing your chest pain. Treatment may include:  Acid blockers for heartburn.  Anti-inflammatory medicine.  Pain medicine for inflammatory conditions.  Antibiotics if an infection is present.  You may be advised to change lifestyle habits. This includes stopping smoking and avoiding alcohol, caffeine, and chocolate.  You may be advised to keep your head raised (elevated) when sleeping. This reduces the chance of acid going backward from your stomach into your  esophagus. Most of the time, nonspecific chest pain will improve within 2-3 days with rest and mild pain medicine.  HOME CARE INSTRUCTIONS   If antibiotics were prescribed, take them as directed. Finish them even if you start to feel better.  For the next few days, avoid physical activities that bring on chest pain. Continue physical activities as directed.  Do not use any tobacco products, including cigarettes, chewing tobacco, or electronic cigarettes.  Avoid drinking alcohol.  Only take medicine as directed by your health care provider.  Follow your health care provider's suggestions for further testing if your chest pain does not go away.  Keep  any follow-up appointments you made. If you do not go to an appointment, you could develop lasting (chronic) problems with pain. If there is any problem keeping an appointment, call to reschedule. SEEK MEDICAL CARE IF:   Your chest pain does not go away, even after treatment.  You have a rash with blisters on your chest.  You have a fever. SEEK IMMEDIATE MEDICAL CARE IF:   You have increased chest pain or pain that spreads to your arm, neck, jaw, back, or abdomen.  You have shortness of breath.  You have an increasing cough, or you cough up blood.  You have severe back or abdominal pain.  You feel nauseous or vomit.  You have severe weakness.  You faint.  You have chills. This is an emergency. Do not wait to see if the pain will go away. Get medical help at once. Call your local emergency services (911 in [place] Do not drive yourself to the hospital. MAKE SURE YOU:   Understand these instructions.  Will watch your condition.  Will get help right away if you are not doing well or get worse. Document Released: 02/27/2005 Document Revised: 05/25/2013 Document Reviewed: 12/24/2007 [facility] Patient Information 2015 [place], [place]. This information is not intended to replace advice given to you by your health care provider.  Make sure you discuss any questions you have with your health care provider.

## 2015-02-10 NOTE — Telephone Encounter (Signed)
Contacted by the ER that patient was seen for chest pain. Work up does not suggest active ischemia. We will arrange outpt follow up in clinic

## 2015-02-10 NOTE — ED Notes (Signed)
Reports urinary frequency so she went to [facility] urgent care and they sent her here for high bp. Denies cp or sob

## 2015-02-10 NOTE — ED Provider Notes (Signed)
[facility] Emergency Department Provider Note  ____________________________________________  Time seen: Approximately 9 PM  I have reviewed the triage vital signs and the nursing notes.   HISTORY  Chief Complaint Hypertension    HPI Sherry Brooks is a 81 y.o. female with a history of peripheral vascular disease who is presenting tonight with hypertension from urgent care. She was seen in urgent care for urinary frequency as well as chest pain.  She was sent to the emergency department because of elevated blood pressure. She says that she has had intermittent chest pressure on and off over the past weeks to months. She says it is in her upper chest. It is not associated with any nausea, vomiting or diaphoresis. She says that she has had some shortness of breath that is also been associated with this for weeks. Has had some radiation into the right upper extremity. No history of coronary artery disease. Denies any history of hypertension although it is listed on the EMR. She says that her blood pressure is normally in the 130s over 70s and she does not take anything for high blood pressure. She says that she does get very anxious though. She says that her urine was found to be negative at the urgent care.Patient said her last episode of chest tightness was this morning. Has not had any since. He is pain-free at this time. Unable to quantify how long each episode lasts. They're not associated with exertion.   Past Medical History  Diagnosis Date  . Macular degeneration   . Complication of anesthesia     wakes up slowly, takes longer than usual to wake  . History of stress test     10 yrs. ago, states it was normal , had been ordered for pt. due to stress related to husband's illness,, by Dr. Vic Ripper  . Hypertension   . Peripheral vascular disease     blood clots in legs mid 2000's   . Renal calculus     history of > 50 yrs. ago  . GERD  (gastroesophageal reflux disease)     on occas. uses TUMS for indigestion   . Arthritis     knees  . Borderline diabetic     Patient Active Problem List   Diagnosis Date Noted  . Spondylolisthesis 10/28/2012    Past Surgical History  Procedure Laterality Date  . Joint replacement      both knees   . Colon surgery      due to diverticulitis- had colostomy, then takedown   . Eye surgery      cataracts removed - /w IOL  . Fracture surgery      L wrist, plate in place  . Appendectomy      as a teenager     Current Outpatient Rx  Name  Route  Sig  Dispense  Refill  . acetaminophen (TYLENOL) 500 MG tablet   Oral   Take 1,000 mg by mouth 3 (three) times daily as needed for pain.         Marland Kitchen ALPRAZolam (XANAX) 0.5 MG tablet   Oral   Take 0.25 mg by mouth daily.          Marland Kitchen b complex vitamins tablet   Oral   Take 1 tablet by mouth daily.         . calcium carbonate (TUMS EX) 750 MG chewable tablet   Oral   Chew 1 tablet by mouth daily as needed for heartburn.         Marland Kitchen  chlorhexidine (PERIDEX) 0.12 % solution               . cholecalciferol (VITAMIN D) 1000 UNITS tablet   Oral   Take 1,000 Units by mouth daily.         Marland Kitchen LASTACAFT 0.25 % SOLN               . lisinopril-hydrochlorothiazide (PRINZIDE,ZESTORETIC) 20-25 MG per tablet   Oral   Take 1 tablet by mouth daily after breakfast.          . Multiple Vitamins-Minerals (OCUVITE PO)   Oral   Take 1 tablet by mouth daily. For eyes.         Marland Kitchen OVER THE COUNTER MEDICATION   Oral   Take 1 tablet by mouth daily. Omega Q plus         . PATADAY 0.2 % SOLN               . phenazopyridine (PYRIDIUM) 100 MG tablet   Oral   Take 1 tablet (100 mg total) by mouth 3 (three) times daily as needed for pain.   10 tablet   0   . pravastatin (PRAVACHOL) 10 MG tablet               . Psyllium (METAMUCIL PO)   Oral   Take 1 capsule by mouth daily.         . traMADol (ULTRAM) 50 MG tablet                . triamcinolone cream (KENALOG) 0.1 %               . VITAMIN E PO   Oral   Take 1 tablet by mouth daily.           Allergies Review of patient's allergies indicates no known allergies.  History reviewed. No pertinent family history.  Social History Social History  Substance Use Topics  . Smoking status: Never Smoker   . Smokeless tobacco: None  . Alcohol Use: Yes     Comment: glass of wine, rare occas.      Review of Systems Constitutional: No fever/chills Eyes: No visual changes. ENT: No sore throat. Cardiovascular: As above  Respiratory: As above  Gastrointestinal: No abdominal pain.  No nausea, no vomiting.  No diarrhea.  No constipation. Genitourinary: Negative for dysuria. Musculoskeletal: Negative for back pain. Skin: Negative for rash. Neurological: Negative for headaches, focal weakness or numbness.  10-point ROS otherwise negative.  ____________________________________________   PHYSICAL EXAM:  VITAL SIGNS: ED Triage Vitals  Enc Vitals Group     BP 02/10/15 1521 183/82 mmHg     Pulse Rate 02/10/15 1521 70     Resp 02/10/15 1521 20     Temp 02/10/15 1521 98 F (36.7 C)     Temp Source 02/10/15 1521 Oral     SpO2 02/10/15 1521 98 %     Weight 02/10/15 1521 210 lb (95.255 kg)     Height 02/10/15 1521  (1.6 m)     Head Cir --      Peak Flow --      Pain Score 02/10/15 1522 2     Pain Loc --      Pain Edu? --      Excl. in GC? --     Constitutional: Alert and oriented. Well appearing and in no acute distress. Eyes: Conjunctivae are normal. PERRL. EOMI. Head: Atraumatic. Nose: No congestion/rhinnorhea. Mouth/Throat: Mucous  membranes are moist.  Oropharynx non-erythematous. Neck: No stridor.   Cardiovascular: Normal rate, regular rhythm. Grossly normal heart sounds.  Good peripheral circulation. Respiratory: Normal respiratory effort.  No retractions. Lungs CTAB. Gastrointestinal: Soft and nontender. No distention. No  abdominal bruits. No CVA tenderness. Musculoskeletal: No lower extremity tenderness nor edema.  No joint effusions. Neurologic:  Normal speech and language. No gross focal neurologic deficits are appreciated. No gait instability. Skin:  Skin is warm, dry and intact. No rash noted. Psychiatric: Mood and affect are normal. Speech and behavior are normal.  ____________________________________________   LABS (all labs ordered are listed, but only abnormal results are displayed)  Labs Reviewed  COMPREHENSIVE METABOLIC PANEL - Abnormal; Notable for the following:    Glucose, Bld 100 (*)    GFR calc non Af Amer 57 (*)    All other components within normal limits  CBC WITH DIFFERENTIAL/PLATELET  TROPONIN I  TROPONIN I   ____________________________________________  EKG  ED ECG REPORT I, Arelia Longest, the attending physician, personally viewed and interpreted this ECG.   Date: 02/10/2015  EKG Time: 1515  Rate: 68  Rhythm: normal sinus rhythm  Axis: Left axis deviation  Intervals: Left ventricular hypertrophy with QRS widening and repolarization abnormality.  ST&T Change: No ST elevations or depressions. No T-wave inversions. No change from EKG done 10/19/2012. Similar morphology and aVL T-wave as well.  ____________________________________________  RADIOLOGY  Chest x-ray without any active cardiopulmonary disease. I personally reviewed this image. ____________________________________________   PROCEDURES    ____________________________________________   INITIAL IMPRESSION / ASSESSMENT AND PLAN / ED COURSE  Pertinent labs & imaging results that were available during my care of the patient were reviewed by me and considered in my medical decision making (see chart for details).  ----------------------------------------- 11:07 PM on 02/10/2015 ----------------------------------------- Patient continues to be pain-free. Blood pressure is trending down now at this  time last measurement at 170s over 76. Discussed case with Dr. Lewie Loron who will see the patient urgently contacted on Monday or Tuesday. The patient has an unchanged EKG from previous and 2 negative troponins. The chest pain has been going on for weeks. I feel that if there was an emergent issue that we should've seen some sort of EKG changes or elevation in her cardiac enzymes. The patient will be discharged home. She will have her blood pressure retaken on Monday in the office.  ____________________________________________   FINAL CLINICAL IMPRESSION(S) / ED DIAGNOSES  Acute chest pain. Acute hypertension. Initial visit.    Myrna Blazer, MD 02/10/15 [id number]

## 2015-02-10 NOTE — ED Notes (Signed)
Pt reports burning with urination, yesterday frequent urination. Pt concerned for UTI.

## 2015-02-14 ENCOUNTER — Ambulatory Visit (INDEPENDENT_AMBULATORY_CARE_PROVIDER_SITE_OTHER): Payer: Medicare Other | Admitting: Cardiovascular Disease

## 2015-02-14 ENCOUNTER — Encounter: Payer: Self-pay | Admitting: Cardiovascular Disease

## 2015-02-14 VITALS — BP 122/72 | HR 79 | Ht 62.0 in | Wt 209.2 lb

## 2015-02-14 DIAGNOSIS — M7989 Other specified soft tissue disorders: Secondary | ICD-10-CM

## 2015-02-14 DIAGNOSIS — F418 Other specified anxiety disorders: Secondary | ICD-10-CM

## 2015-02-14 DIAGNOSIS — F329 Major depressive disorder, single episode, unspecified: Secondary | ICD-10-CM | POA: Insufficient documentation

## 2015-02-14 DIAGNOSIS — R079 Chest pain, unspecified: Secondary | ICD-10-CM

## 2015-02-14 DIAGNOSIS — I10 Essential (primary) hypertension: Secondary | ICD-10-CM

## 2015-02-14 DIAGNOSIS — R0602 Shortness of breath: Secondary | ICD-10-CM

## 2015-02-14 DIAGNOSIS — F419 Anxiety disorder, unspecified: Secondary | ICD-10-CM

## 2015-02-14 DIAGNOSIS — F32A Depression, unspecified: Secondary | ICD-10-CM

## 2015-02-14 DIAGNOSIS — R2681 Unsteadiness on feet: Secondary | ICD-10-CM

## 2015-02-14 DIAGNOSIS — E785 Hyperlipidemia, unspecified: Secondary | ICD-10-CM | POA: Insufficient documentation

## 2015-02-14 DIAGNOSIS — M170 Bilateral primary osteoarthritis of knee: Secondary | ICD-10-CM

## 2015-02-14 NOTE — Assessment & Plan Note (Signed)
I suspect her biggest issue is her underlying anxiety. Family who presents with her today reports this is a major issue. Suggested she discuss this with Dr. Elease Hashimoto. Uncertain if she may benefit from additional medications such as Paxil or Zoloft, or additional Xanax as needed

## 2015-02-14 NOTE — Assessment & Plan Note (Signed)
Blood pressure is well controlled on today's visit. No changes made to the medications. Previous episodes of hypertension likely in the setting of anxiety. If she continues to have these episodes, could potentially use nitroglycerin when necessary

## 2015-02-14 NOTE — Progress Notes (Signed)
Patient ID: Sherry Brooks, female    DOB: Nov 24, 1928, 81 y.o.   MRN: [id number]  HPI Comments: Sherry Brooks  is a pleasant 81 year old woman with history of bilateral knee replacements, back surgery, macular degeneration, anxiety, recent evaluation in the emergency room for hypertension and chest discomfort, who presents to establish care in the [place] office.  Several months ago she reports having polyuria and was given Pyridium by urgent care. This seemed to help her symptoms.  Last week she developed similar symptoms and went to urgent care in [place]. Her blood pressure was elevated and she was told she did not have a urinary tract infection and was sent to the emergency room for hypertension. Also reported having some chest discomfort on the left. EKG and cardiac enzymes were negative and she was discharged home. She denies any further chest pain symptoms since her discharge.  Review of her records show she had echocardiogram and stress test in March 2016 which were normal by report.  These reports were available through care everywhere  She has a full-time caretaker, does not drive. Daughter-in-law presents with her today and reports that she has significant problems with anxiety. She wonders if she has panic attacks. She takes Xanax daily. Both patient and family wonder if she needs something else as well for episodic anxiety spells.  EKG on today's visit shows normal sinus rhythm with rate 79 bpm, poor R-wave progression through the anterior precordial leads, left anterior fascicular block, PVCs noted  No Known Allergies  Current Outpatient Prescriptions on File Prior to Visit  Medication Sig Dispense Refill  . ALPRAZolam (XANAX) 0.5 MG tablet Take 0.25 mg by mouth daily.     Marland Kitchen b complex vitamins tablet Take 1 tablet by mouth daily.    . calcium carbonate (TUMS EX) 750 MG chewable tablet Chew 1 tablet by mouth daily as needed for heartburn.    . chlorhexidine (PERIDEX)  0.12 % solution Use as directed in the mouth or throat 2 (two) times daily.     . cholecalciferol (VITAMIN D) 1000 UNITS tablet Take 1,000 Units by mouth daily.    Marland Kitchen lisinopril-hydrochlorothiazide (PRINZIDE,ZESTORETIC) 20-25 MG per tablet Take 1 tablet by mouth daily after breakfast.     . PATADAY 0.2 % SOLN Place 0.2 drops into both eyes daily.     . pravastatin (PRAVACHOL) 10 MG tablet Take 10 mg by mouth daily.     . Psyllium (METAMUCIL PO) Take 1 capsule by mouth daily.    Marland Kitchen VITAMIN E PO Take 1 tablet by mouth daily.     No current facility-administered medications on file prior to visit.    Past Medical History  Diagnosis Date  . Macular degeneration   . Complication of anesthesia     wakes up slowly, takes longer than usual to wake  . History of stress test     10 yrs. ago, states it was normal , had been ordered for pt. due to stress related to husband's illness,, by Dr. Vic Ripper  . Hypertension   . Peripheral vascular disease     blood clots in legs mid 2000's   . Renal calculus     history of > 50 yrs. ago  . GERD (gastroesophageal reflux disease)     on occas. uses TUMS for indigestion   . Arthritis     knees  . Borderline diabetic   . Heart murmur     Past Surgical History  Procedure Laterality Date  .  Joint replacement      both knees   . Colon surgery      due to diverticulitis- had colostomy, then takedown   . Eye surgery      cataracts removed - /w IOL  . Fracture surgery      L wrist, plate in place  . Appendectomy      as a teenager     Social History  reports that she has never smoked. She does not have any smokeless tobacco history on file. She reports that she drinks alcohol. She reports that she does not use illicit drugs.  Family History Family history is unknown by patient.      Review of Systems  Constitutional: Negative.   Respiratory: Negative.   Cardiovascular: Positive for chest pain.  Gastrointestinal: Negative.    Musculoskeletal: Positive for arthralgias and gait problem.  Skin: Negative.   Neurological: Negative.   Hematological: Negative.   Psychiatric/Behavioral: Positive for dysphoric mood. The patient is nervous/anxious.   All other systems reviewed and are negative.   BP 122/72 mmHg  Pulse 79  Ht 5\' 2"  (1.575 m)  Wt 209 lb 4 oz (94.915 kg)  BMI 38.26 kg/m2   Physical Exam  Constitutional: She is oriented to person, place, and time. She appears well-developed and well-nourished.   obese  HENT:  Head: Normocephalic.  Nose: Nose normal.  Mouth/Throat: Oropharynx is clear and moist.  Eyes: Conjunctivae are normal. Pupils are equal, round, and reactive to light.  Neck: Normal range of motion. Neck supple. No JVD present.  Cardiovascular: Normal rate, regular rhythm, normal heart sounds and intact distal pulses.  Exam reveals no gallop and no friction rub.   No murmur heard. Nonpitting edema, compression hose in place  Pulmonary/Chest: Effort normal and breath sounds normal. No respiratory distress. She has no wheezes. She has no rales. She exhibits no tenderness.  Abdominal: Soft. Bowel sounds are normal. She exhibits no distension. There is no tenderness.  Musculoskeletal: Normal range of motion. She exhibits no edema or tenderness.  Lymphadenopathy:    She has no cervical adenopathy.  Neurological: She is alert and oriented to person, place, and time. Coordination normal.  Skin: Skin is warm and dry. No rash noted. No erythema.  Psychiatric: She has a normal mood and affect. Her behavior is normal. Judgment and thought content normal.

## 2015-02-14 NOTE — Assessment & Plan Note (Signed)
Recommended that she stay on her pravastatin 

## 2015-02-14 NOTE — Assessment & Plan Note (Signed)
Atypical chest pain on the left. Recent negative stress test, normal echocardiogram. Recent chest pain in the setting of polyuria. No further workup at this time. Recommended that she call our office if she continues to have additional episodes. Nitroglycerin could be offered to take as needed but family is concerned as she is blind and she might overtake the medications

## 2015-02-14 NOTE — Patient Instructions (Signed)
You are doing well. No medication changes were made.  Please call if you have more chest pain symptoms  Please call us if you have new issues that need to be addressed before your next appt.

## 2015-02-14 NOTE — Assessment & Plan Note (Signed)
Currently uses a cane. No recent falls

## 2015-02-14 NOTE — Assessment & Plan Note (Signed)
Unable to exercise secondary to severe osteoarthritis. Recommended diet modification

## 2015-02-14 NOTE — Assessment & Plan Note (Signed)
Compression hose in place. Leg swelling likely from venous insufficiency

## 2015-02-20 ENCOUNTER — Other Ambulatory Visit: Payer: Self-pay | Admitting: Family Medicine

## 2015-02-20 DIAGNOSIS — F419 Anxiety disorder, unspecified: Principal | ICD-10-CM

## 2015-02-20 DIAGNOSIS — F32A Depression, unspecified: Secondary | ICD-10-CM

## 2015-02-20 DIAGNOSIS — F329 Major depressive disorder, single episode, unspecified: Secondary | ICD-10-CM

## 2015-02-20 NOTE — Telephone Encounter (Signed)
Next ov is on 03/28/2015. Last time this medication was refilled 08/08/2014 for 5 x.  Thanks,

## 2015-02-20 NOTE — Telephone Encounter (Signed)
Ok to refill Rx. Thanks.

## 2015-03-22 ENCOUNTER — Other Ambulatory Visit: Payer: Self-pay | Admitting: Family Medicine

## 2015-03-22 DIAGNOSIS — E785 Hyperlipidemia, unspecified: Secondary | ICD-10-CM

## 2015-03-22 NOTE — Telephone Encounter (Signed)
Last OV 10/2014 

## 2015-03-28 ENCOUNTER — Encounter: Payer: Self-pay | Admitting: Family Medicine

## 2015-03-28 ENCOUNTER — Other Ambulatory Visit: Payer: Self-pay | Admitting: Family Medicine

## 2015-03-28 ENCOUNTER — Ambulatory Visit (INDEPENDENT_AMBULATORY_CARE_PROVIDER_SITE_OTHER): Payer: Medicare Other | Admitting: Family Medicine

## 2015-03-28 ENCOUNTER — Encounter: Payer: Self-pay | Admitting: Emergency Medicine

## 2015-03-28 VITALS — BP 166/80 | HR 64 | Temp 98.7°F | Resp 16 | Ht 62.0 in | Wt 213.0 lb

## 2015-03-28 DIAGNOSIS — R928 Other abnormal and inconclusive findings on diagnostic imaging of breast: Secondary | ICD-10-CM | POA: Insufficient documentation

## 2015-03-28 DIAGNOSIS — M1990 Unspecified osteoarthritis, unspecified site: Secondary | ICD-10-CM | POA: Insufficient documentation

## 2015-03-28 DIAGNOSIS — E039 Hypothyroidism, unspecified: Secondary | ICD-10-CM | POA: Insufficient documentation

## 2015-03-28 DIAGNOSIS — Z0000 Encounter for general adult medical examination without abnormal findings: Secondary | ICD-10-CM

## 2015-03-28 DIAGNOSIS — E1122 Type 2 diabetes mellitus with diabetic chronic kidney disease: Secondary | ICD-10-CM

## 2015-03-28 DIAGNOSIS — E038 Other specified hypothyroidism: Secondary | ICD-10-CM | POA: Insufficient documentation

## 2015-03-28 DIAGNOSIS — F419 Anxiety disorder, unspecified: Secondary | ICD-10-CM | POA: Insufficient documentation

## 2015-03-28 DIAGNOSIS — E7800 Pure hypercholesterolemia, unspecified: Secondary | ICD-10-CM | POA: Insufficient documentation

## 2015-03-28 DIAGNOSIS — F339 Major depressive disorder, recurrent, unspecified: Secondary | ICD-10-CM | POA: Diagnosis not present

## 2015-03-28 DIAGNOSIS — N183 Chronic kidney disease, stage 3 unspecified: Secondary | ICD-10-CM | POA: Insufficient documentation

## 2015-03-28 DIAGNOSIS — IMO0001 Reserved for inherently not codable concepts without codable children: Secondary | ICD-10-CM | POA: Insufficient documentation

## 2015-03-28 DIAGNOSIS — M545 Low back pain, unspecified: Secondary | ICD-10-CM | POA: Insufficient documentation

## 2015-03-28 DIAGNOSIS — IMO0002 Reserved for concepts with insufficient information to code with codable children: Secondary | ICD-10-CM | POA: Insufficient documentation

## 2015-03-28 DIAGNOSIS — G8929 Other chronic pain: Secondary | ICD-10-CM | POA: Insufficient documentation

## 2015-03-28 DIAGNOSIS — Z23 Encounter for immunization: Secondary | ICD-10-CM

## 2015-03-28 DIAGNOSIS — R413 Other amnesia: Secondary | ICD-10-CM | POA: Insufficient documentation

## 2015-03-28 DIAGNOSIS — H3530 Unspecified macular degeneration: Secondary | ICD-10-CM | POA: Insufficient documentation

## 2015-03-28 DIAGNOSIS — I890 Lymphedema, not elsewhere classified: Secondary | ICD-10-CM | POA: Insufficient documentation

## 2015-03-28 MED ORDER — ESCITALOPRAM OXALATE 10 MG PO TABS: 10.0000 mg | ORAL_TABLET | Freq: Every day | ORAL | Status: DC

## 2015-03-28 NOTE — Progress Notes (Signed)
Patient ID: Sherry Brooks, female   DOB: 04-01-1929, 81 y.o.   MRN: [id number]        Patient: Sherry Brooks, Female    DOB: 04-01-1929, 81 y.o.   MRN: [id number] Visit Date: 03/28/2015  Today's Provider: Lorie PhenixNancy Leodis Alcocer, MD   Chief Complaint  Patient presents with  . Medicare Wellness  . Follow-up   Subjective:    Annual wellness visit Sherry Brooks is a 81 y.o. female. She feels fairly well. She reports exercising daily. She reports she is sleeping well.  03/23/14 CPE 08/01/08 Pap-normal 07/01/11 Mammo 07/26/14 EKG  Lab Results  Component Value Date   WBC 6.3 02/10/2015   HGB 13.4 02/10/2015   HCT 41.6 02/10/2015   PLT 273 02/10/2015   GLUCOSE 100* 02/10/2015   ALT 18 02/10/2015   AST 23 02/10/2015   NA 141 02/10/2015   K 3.7 02/10/2015   CL 104 02/10/2015   CREATININE 0.90 02/10/2015   BUN 20 02/10/2015   CO2 29 02/10/2015   TSH 4.23 07/26/2014   HGBA1C 5.7 08/23/2014      Follow up ER visit  Patient was seen in ER for elevated BP on 02/10/15. She was treated for anxiety attack per daughter in law. Treatment for this included one dose of Lorazepam at ER. She reports fair compliance with treatment. She reports this condition is Unchanged.  Also with a lot of chronic pain and depression.  Mood is not good. Is depressed. Does not feel like doing anything.  Has been a problem for a long while. Never wanted medication, but feels bad enough now to want treatment.  Here with her daughter today, who is also encouraging her to start treatment.   ------------------------------------------------------------------------------------      Review of Systems  Constitutional: Positive for activity change (crying irritability).  Eyes: Positive for redness, itching and visual disturbance.  Respiratory: Positive for shortness of breath.   Cardiovascular: Positive for leg swelling.  Genitourinary: Positive for frequency.  Musculoskeletal: Positive for  myalgias, back pain and arthralgias.  Hematological: Bruises/bleeds easily.  Psychiatric/Behavioral: Positive for confusion. The patient is nervous/anxious.     Social History   Social History  . Marital Status: Widowed    Spouse Name: N/A  . Number of Children: N/A  . Years of Education: N/A   Occupational History  . Not on file.   Social History Main Topics  . Smoking status: Never Smoker   . Smokeless tobacco: Never Used  . Alcohol Use: Yes     Comment: glass of wine, rare occas.   . Drug Use: No  . Sexual Activity: Not on file   Other Topics Concern  . Not on file   Social History Narrative    Patient Active Problem List   Diagnosis Date Noted  . Abnormal finding on mammography 03/28/2015  . Adult BMI 30+ 03/28/2015  . Chronic kidney disease (CKD), stage III (moderate) 03/28/2015  . Chronic LBP 03/28/2015  . Can't get food down 03/28/2015  . Accumulation of fluid in tissues 03/28/2015  . Degeneration macular 03/28/2015  . Amnesia 03/28/2015  . Subclinical hypothyroidism 03/28/2015  . Hypercholesterolemia 03/28/2015  . Pain in the chest 02/14/2015  . SOB (shortness of breath) 02/14/2015  . Morbid obesity (HCC) 02/14/2015  . Unsteady gait 02/14/2015  . Primary osteoarthritis of both knees 02/14/2015  . Essential hypertension 02/14/2015  . Anxiety and depression 02/14/2015  . Spondylolisthesis 10/28/2012  . Arthritis, degenerative 08/25/2009  . Type 2 diabetes mellitus (  HCC) 07/03/2007  . Colon, diverticulosis 02/12/2001  . OP (osteoporosis) 02/12/2001  . HLD (hyperlipidemia) 02/12/2001    Past Surgical History  Procedure Laterality Date  . Colon surgery      due to diverticulitis- had colostomy, then takedown   . Eye surgery      cataracts removed - /w IOL  . Appendectomy      as a teenager   . Biopsy thyroid  06/2008  . Fracture surgery      L wrist, plate in place  . Joint replacement      both knees   . Spine surgery  10/23/2012    Spinal  Fusion, lower back    Her family history includes Arthritis in her father and mother.    Previous Medications   ALPRAZOLAM (XANAX) 0.5 MG TABLET    Take 0.5 mg by mouth at bedtime as needed.    ARTIFICIAL TEAR SOLUTION OP    Apply to eye.   B COMPLEX VITAMINS (VITAMIN B COMPLEX PO)       CALCIUM CARBONATE (TUMS EX) 750 MG CHEWABLE TABLET    Chew 1 tablet by mouth daily as needed for heartburn.   CALCIUM CARBONATE-VITAMIN D (CALCIUM-VITAMIN D) 600-125 MG-UNIT TABS    Take by mouth.   LISINOPRIL-HYDROCHLOROTHIAZIDE (ZESTORETIC) 20-25 MG TABLET    Take by mouth.   MULTIPLE VITAMINS-MINERALS (PRESERVISION AREDS) CAPS    Take by mouth.   NAPROXEN SODIUM (ANAPROX) 220 MG TABLET    Take 220 mg by mouth 2 (two) times daily with a meal.   OLOPATADINE HCL (PATADAY) 0.2 % SOLN    Apply to eye.   PRAVASTATIN (PRAVACHOL) 10 MG TABLET    Take by mouth.   PSYLLIUM (METAMUCIL PO)    Take 1 capsule by mouth daily.   TRIAMCINOLONE CREAM (KENALOG) 0.1 %    APPLY TO AFFECTED AREA TWICE A DAY AS NEEDED   VITAMIN E PO    Take 1 tablet by mouth daily.    Patient Care Team: Lorie Phenix, MD as PCP - General (Family Medicine)     Objective:   Vitals: BP 166/80 mmHg  Pulse 64  Temp(Src) 98.7 F (37.1 C) (Oral)  Resp 16  Ht  (1.575 m)  Wt 213 lb (96.616 kg)  BMI 38.95 kg/m2  Physical Exam  Constitutional: She is oriented to person, place, and time. She appears well-developed and well-nourished.  HENT:  Head: Normocephalic and atraumatic.  Right Ear: Tympanic membrane, external ear and ear canal normal.  Left Ear: Tympanic membrane, external ear and ear canal normal.  Nose: Nose normal.  Mouth/Throat: Uvula is midline, oropharynx is clear and moist and mucous membranes are normal.  Eyes: Conjunctivae, EOM and lids are normal. Pupils are equal, round, and reactive to light.  Neck: Trachea normal and normal range of motion. Neck supple. Carotid bruit is not present. No thyroid mass and no  thyromegaly present.  Cardiovascular: Normal rate, regular rhythm and normal heart sounds.   Pulmonary/Chest: Effort normal and breath sounds normal.  Abdominal: Soft. Normal appearance and bowel sounds are normal. There is no hepatosplenomegaly. There is no tenderness.  Musculoskeletal: Normal range of motion.  Lymphadenopathy:    She has no cervical adenopathy.    She has no axillary adenopathy.  Neurological: She is alert and oriented to person, place, and time. She has normal strength. No cranial nerve deficit.  Skin: Skin is warm, dry and intact.  Psychiatric: She has a normal mood and affect.  Her speech is normal and behavior is normal. Judgment and thought content normal. Cognition and memory are normal.    Activities of Daily Living In your present state of health, do you have any difficulty performing the following activities: 03/28/2015  Hearing? N  Vision? Y  Difficulty concentrating or making decisions? Y  Walking or climbing stairs? Y  Dressing or bathing? N  Doing errands, shopping? Y    Fall Risk Assessment Fall Risk  03/28/2015  Falls in the past year? No     Depression Screen PHQ 2/9 Scores 03/28/2015  PHQ - 2 Score 6  PHQ- 9 Score 14    Cognitive Testing - 6-CIT  Correct? Score   What year is it? yes 0 0 or 4  What month is it? yes 0 0 or 3  Memorize:    Floyde Parkins,  44,  [address],  [place],      What time is it? (within 1 hour) no 0 0 or 3  Count backwards from 20 yes 2 0, 2, or 4  Name the months of the year yes 0 0, 2, or 4  Repeat name & address above yes 3 0, 2, 4, 6, 8, or 10       TOTAL SCORE  5/28   Interpretation:  Normal  Normal (0-7) Abnormal (8-28)       Assessment & Plan:     Annual Wellness Visit  Reviewed patient's Family Medical History Reviewed and updated list of patient's medical providers Assessment of cognitive impairment was done Assessed patient's functional ability Established a written schedule for health  screening services Health Risk Assessent Completed and Reviewed  Exercise Activities and Dietary recommendations Goals    . Exercise 150 minutes per week (moderate activity)       Immunization History  Administered Date(s) Administered  . Influenza, High Dose Seasonal PF 03/28/2015  . Pneumococcal Conjugate-13 03/28/2015  . Pneumococcal Polysaccharide-23 07/21/2003  . Tdap 03/20/2011    Health Maintenance  Topic Date Due  . HEMOGLOBIN A1C  08/23/30  . FOOT EXAM  10/11/1940  . OPHTHALMOLOGY EXAM  10/11/1940  . URINE MICROALBUMIN  10/11/1940  . TETANUS/TDAP  10/11/1949  . ZOSTAVAX  10/12/1990  . DEXA SCAN  10/12/1995  . PNA vac Low Risk Adult (1 of 2 - PCV13) 10/12/1995  . INFLUENZA VACCINE  01/02/2015        1. Medicare annual wellness visit, subsequent Stable. Patient advised to continue eating healthy and exercise daily.   2. Episode of recurrent major depressive disorder, unspecified depression episode severity (HCC) New problem.  Worsening. Patient started on escsitalopram 10 mg as below. F/U in 4 weeks. - escitalopram (LEXAPRO) 10 MG tablet; Take 1 tablet (10 mg total) by mouth daily.  Dispense: 30 tablet; Refill: 5  3. Need for influenza vaccination - Flu vaccine HIGH DOSE PF  4. Need for pneumococcal vaccination - Pneumococcal conjugate vaccine 13-valent IM  5. Type 2 diabetes mellitus with diabetic chronic kidney disease, unspecified CKD stage, unspecified long term insulin use status (HCC) Will check labs.  - Hemoglobin A1c   Patient seen and examined by Dr. Leo Grosser, and note scribed by Liz Beach. Dimas, CMA.  I have reviewed the document for accuracy and completeness and I agree with above. Leo Grosser, MD   Lorie Phenix, MD    ----------------------------------------------------------------------------------------------------------

## 2015-03-29 LAB — HEMOGLOBIN A1C
ESTIMATED AVERAGE GLUCOSE: 128 mg/dL
Hgb A1c MFr Bld: 6.1 % — ABNORMAL HIGH (ref 4.8–5.6)

## 2015-03-30 ENCOUNTER — Telehealth: Payer: Self-pay

## 2015-03-30 NOTE — Telephone Encounter (Signed)
I advised Sherry Brooks of her lab results.  (Sugar was stable).  I was not able to leave Sherry Brooks a message.   Thanks,   -Sherry Brooks

## 2015-03-30 NOTE — Telephone Encounter (Signed)
Pt advised.   Thanks,   -Jahleah Mariscal  

## 2015-03-30 NOTE — Telephone Encounter (Signed)
Patient's daughter is returning call back. Stated one of Dr. Santiago BurMaloney's nurse called her and is returning the call.  Thanks,  Josleine

## 2015-03-30 NOTE — Telephone Encounter (Signed)
-----   Message from Lorie PhenixNancy Maloney, MD sent at 03/29/2015  3:49 PM EDT ----- Blood sugar stable. Please notify patient. Thanks.

## 2015-04-03 ENCOUNTER — Telehealth: Payer: Self-pay | Admitting: Family Medicine

## 2015-04-03 NOTE — Telephone Encounter (Signed)
[name] reports that Ms. Warschokow is having trouble with nausea and lightheadedness since starting Lexapro 03/28/2015.  She reports that Ms. Warschokow started with a whole pill instead of starting with a half of a pill for a week.  I advised [name] to cut the Lexapro in half and see if that helps with the symptoms.  She will call back to let us know if that helped.  Pt has a follow 04/18/2015.  Thanks,   -Vernona RiegerLaura

## 2015-04-03 NOTE — Telephone Encounter (Signed)
Pt's daughter in law [name] called b/c pt has been taking escitalopram (LEXAPRO) 10 MG tablet and thinks it is making her feel light headed. [name] would like to discuss this with a nurse. Thanks TNP

## 2015-04-07 NOTE — Telephone Encounter (Signed)
[name] stated that on Monday 04/03/15 they started cutting pt's escitalopram (LEXAPRO) 10 MG tablet in half so she was only getting 5 mg a day. Pt has told [name] that she is seeing lines and flowers on the wall. [name] stated that there isn't flowers or lines on the wall. [name] wanted to know if they should stop giving pt the medication and try something else. Please advise. Thanks TNP

## 2015-04-07 NOTE — Telephone Encounter (Signed)
[name] advised; apt made for 04/12/2015  Thanks,   -Vernona RiegerLaura

## 2015-04-07 NOTE — Telephone Encounter (Signed)
Stop for now and have patient follow up to assess best next step. Thanks.

## 2015-04-12 ENCOUNTER — Encounter: Payer: Self-pay | Admitting: Family Medicine

## 2015-04-12 ENCOUNTER — Ambulatory Visit (INDEPENDENT_AMBULATORY_CARE_PROVIDER_SITE_OTHER): Payer: Medicare Other | Admitting: Family Medicine

## 2015-04-12 VITALS — BP 126/80 | HR 72 | Temp 97.7°F | Resp 16 | Wt 215.0 lb

## 2015-04-12 DIAGNOSIS — I10 Essential (primary) hypertension: Secondary | ICD-10-CM

## 2015-04-12 DIAGNOSIS — N183 Chronic kidney disease, stage 3 unspecified: Secondary | ICD-10-CM

## 2015-04-12 DIAGNOSIS — F329 Major depressive disorder, single episode, unspecified: Secondary | ICD-10-CM

## 2015-04-12 DIAGNOSIS — F32A Depression, unspecified: Secondary | ICD-10-CM

## 2015-04-12 DIAGNOSIS — F418 Other specified anxiety disorders: Secondary | ICD-10-CM

## 2015-04-12 DIAGNOSIS — F419 Anxiety disorder, unspecified: Principal | ICD-10-CM

## 2015-04-12 MED ORDER — BUPROPION HCL 75 MG PO TABS: 75.0000 mg | ORAL_TABLET | Freq: Two times a day (BID) | ORAL | Status: DC

## 2015-04-12 NOTE — Progress Notes (Signed)
Patient ID: Sherry Brooks, female   DOB: 1928/06/27, 81 y.o.   MRN: [id number]         Patient: Sherry Brooks Female    DOB: [birth year]   81 y.o.   MRN: [id number] Visit Date: 04/12/2015  Today's Provider: Lorie Phenix, MD   Chief Complaint  Patient presents with  . Depression   Subjective:    Depression        This is a chronic problem.  Associated symptoms include fatigue (Has improved some).  Associated symptoms include no decreased concentration, no appetite change, no headaches and no suicidal ideas.  Past treatments include SSRIs - Selective serotonin reuptake inhibitors.  Compliance with treatment is poor.  Past compliance problems: Pt did not tolerate the medication well.  Previous treatment provided no relief relief.  Risk factors include a change in medications.   Has had something similar in the past. Eye doctor felt not related to eyes.  Did stop the medication.    BP is stable. No side effects to medication.       No Known Allergies Previous Medications   ALPRAZOLAM (XANAX) 0.5 MG TABLET    Take 0.5 mg by mouth at bedtime as needed.    ARTIFICIAL TEAR SOLUTION OP    Apply to eye.   B COMPLEX VITAMINS (VITAMIN B COMPLEX PO)       CALCIUM CARBONATE (TUMS EX) 750 MG CHEWABLE TABLET    Chew 1 tablet by mouth daily as needed for heartburn.   CALCIUM CARBONATE-VITAMIN D (CALCIUM-VITAMIN D) 600-125 MG-UNIT TABS    Take by mouth.   ESCITALOPRAM (LEXAPRO) 10 MG TABLET    Take 1 tablet (10 mg total) by mouth daily.   LISINOPRIL-HYDROCHLOROTHIAZIDE (ZESTORETIC) 20-25 MG TABLET    Take by mouth.   MULTIPLE VITAMINS-MINERALS (PRESERVISION AREDS) CAPS    Take by mouth.   NAPROXEN SODIUM (ANAPROX) 220 MG TABLET    Take 220 mg by mouth 2 (two) times daily with a meal.   OLOPATADINE HCL (PATADAY) 0.2 % SOLN    Apply to eye.   PRAVASTATIN (PRAVACHOL) 10 MG TABLET    Take by mouth.   PSYLLIUM (METAMUCIL PO)    Take 1 capsule by mouth daily.   TRIAMCINOLONE CREAM  (KENALOG) 0.1 %    APPLY TO AFFECTED AREA TWICE A DAY AS NEEDED   VITAMIN E PO    Take 1 tablet by mouth daily.    Review of Systems  Constitutional: Positive for fatigue (Has improved some). Negative for fever, chills, diaphoresis, activity change, appetite change and unexpected weight change.  Respiratory: Negative.   Cardiovascular: Positive for leg swelling (Feet swell). Negative for chest pain and palpitations.  Gastrointestinal: Negative for nausea, vomiting, abdominal pain, diarrhea, constipation, blood in stool, abdominal distention, anal bleeding and rectal pain.  Musculoskeletal: Positive for back pain (Chronic issue).  Neurological: Negative for dizziness, seizures, syncope, facial asymmetry, speech difficulty, light-headedness and headaches.  Hematological: Does not bruise/bleed easily.  Psychiatric/Behavioral: Positive for depression, hallucinations (Pt reports this is improving some since stopping Lexapro. ) and dysphoric mood. Negative for suicidal ideas, behavioral problems, confusion, sleep disturbance, self-injury, decreased concentration and agitation. The patient is nervous/anxious. The patient is not hyperactive.     Social History  Substance Use Topics  . Smoking status: Never Smoker   . Smokeless tobacco: Never Used  . Alcohol Use: Yes     Comment: glass of wine, rare occas.    Objective:   BP 126/80 mmHg  Pulse 72  Temp(Src) 97.7 F (36.5 C) (Oral)  Resp 16  Wt 215 lb (97.523 kg)  Physical Exam  Constitutional: She is oriented to person, place, and time. She appears well-developed and well-nourished.  Cardiovascular: Normal rate and regular rhythm.   Pulmonary/Chest: Effort normal and breath sounds normal.  Musculoskeletal: Normal range of motion.  Neurological: She is alert and oriented to person, place, and time.  Psychiatric: She has a normal mood and affect. Her behavior is normal. Judgment and thought content normal.      Assessment & Plan:     1.  Anxiety and depression Had reaction to Lexapro. Will start Wellbutrin and recheck in 2 weeks.   - buPROPion (WELLBUTRIN) 75 MG tablet; Take 1 tablet (75 mg total) by mouth 2 (two) times daily. One a day for 7 days and then one twice a day.  Dispense: 60 tablet; Refill: 5  2. Essential hypertension Stable. Continue current medication.   3. Chronic kidney disease (CKD), stage III (moderate) Stable. Check labs as needed.   Lorie PhenixNancy Cailin Gebel, MD       Lorie PhenixNancy Robertt Buda, MD  [facility]

## 2015-04-18 ENCOUNTER — Ambulatory Visit: Payer: Medicare Other | Admitting: Family Medicine

## 2015-04-19 ENCOUNTER — Other Ambulatory Visit: Payer: Self-pay | Admitting: Family Medicine

## 2015-04-19 DIAGNOSIS — I10 Essential (primary) hypertension: Secondary | ICD-10-CM

## 2015-06-23 ENCOUNTER — Encounter: Payer: Self-pay | Admitting: Physician Assistant

## 2015-06-23 ENCOUNTER — Ambulatory Visit (INDEPENDENT_AMBULATORY_CARE_PROVIDER_SITE_OTHER): Payer: Medicare Other | Admitting: Physician Assistant

## 2015-06-23 VITALS — BP 140/80 | HR 83 | Temp 97.6°F | Resp 16 | Wt 214.8 lb

## 2015-06-23 DIAGNOSIS — R350 Frequency of micturition: Secondary | ICD-10-CM | POA: Diagnosis not present

## 2015-06-23 LAB — POCT URINALYSIS DIPSTICK
BILIRUBIN UA: NEGATIVE
Glucose, UA: NEGATIVE
KETONES UA: NEGATIVE
Leukocytes, UA: NEGATIVE
NITRITE UA: NEGATIVE
PH UA: 5
Protein, UA: NEGATIVE
RBC UA: NEGATIVE
Spec Grav, UA: 1.015
Urobilinogen, UA: 0.2

## 2015-06-23 LAB — POCT GLYCOSYLATED HEMOGLOBIN (HGB A1C)
ESTIMATED AVERAGE GLUCOSE: 131
Hemoglobin A1C: 6.2

## 2015-06-23 NOTE — Patient Instructions (Addendum)
Diabetes Mellitus and Food It is important for you to manage your blood sugar (glucose) level. Your blood glucose level can be greatly affected by what you eat. Eating healthier foods in the appropriate amounts throughout the day at about the same time each day will help you control your blood glucose level. It can also help slow or prevent worsening of your diabetes mellitus. Healthy eating may even help you improve the level of your blood pressure and reach or maintain a healthy weight.  General recommendations for healthful eating and cooking habits include:  Eating meals and snacks regularly. Avoid going long periods of time without eating to lose weight.  Eating a diet that consists mainly of plant-based foods, such as fruits, vegetables, nuts, legumes, and whole grains.  Using low-heat cooking methods, such as baking, instead of high-heat cooking methods, such as deep frying. Work with your dietitian to make sure you understand how to use the Nutrition Facts information on food labels. HOW CAN FOOD AFFECT ME? Carbohydrates Carbohydrates affect your blood glucose level more than any other type of food. Your dietitian will help you determine how many carbohydrates to eat at each meal and teach you how to count carbohydrates. Counting carbohydrates is important to keep your blood glucose at a healthy level, especially if you are using insulin or taking certain medicines for diabetes mellitus. Alcohol Alcohol can cause sudden decreases in blood glucose (hypoglycemia), especially if you use insulin or take certain medicines for diabetes mellitus. Hypoglycemia can be a life-threatening condition. Symptoms of hypoglycemia (sleepiness, dizziness, and disorientation) are similar to symptoms of having too much alcohol.  If your health care provider has given you approval to drink alcohol, do so in moderation and use the following guidelines:  Women should not have more than one drink per day, and men  should not have more than two drinks per day. One drink is equal to:  12 oz of beer.  5 oz of wine.  1 oz of hard liquor.  Do not drink on an empty stomach.  Keep yourself hydrated. Have water, diet soda, or unsweetened iced tea.  Regular soda, juice, and other mixers might contain a lot of carbohydrates and should be counted. WHAT FOODS ARE NOT RECOMMENDED? As you make food choices, it is important to remember that all foods are not the same. Some foods have fewer nutrients per serving than other foods, even though they might have the same number of calories or carbohydrates. It is difficult to get your body what it needs when you eat foods with fewer nutrients. Examples of foods that you should avoid that are high in calories and carbohydrates but low in nutrients include:  Trans fats (most processed foods list trans fats on the Nutrition Facts label).  Regular soda.  Juice.  Candy.  Sweets, such as cake, pie, doughnuts, and cookies.  Fried foods. WHAT FOODS CAN I EAT? Eat nutrient-rich foods, which will nourish your body and keep you healthy. The food you should eat also will depend on several factors, including:  The calories you need.  The medicines you take.  Your weight.  Your blood glucose level.  Your blood pressure level.  Your cholesterol level. You should eat a variety of foods, including:  Protein.  Lean cuts of meat.  Proteins low in saturated fats, such as fish, egg whites, and beans. Avoid processed meats.  Fruits and vegetables.  Fruits and vegetables that may help control blood glucose levels, such as apples, mangoes, and   yams.  Dairy products.  Choose fat-free or low-fat dairy products, such as milk, yogurt, and cheese.  Grains, bread, pasta, and rice.  Choose whole grain products, such as multigrain bread, whole oats, and brown rice. These foods may help control blood pressure.  Fats.  Foods containing healthful fats, such as nuts,  avocado, olive oil, canola oil, and fish. DOES EVERYONE WITH DIABETES MELLITUS HAVE THE SAME MEAL PLAN? Because every person with diabetes mellitus is different, there is not one meal plan that works for everyone. It is very important that you meet with a dietitian who will help you create a meal plan that is just right for you.   This information is not intended to replace advice given to you by your health care provider. Make sure you discuss any questions you have with your health care provider.   Document Released: 02/14/2005 Document Revised: 06/10/2014 Document Reviewed: 04/16/2013 Elsevier Interactive Patient Education [employer] Urinary Frequency The number of times a normal person urinates depends upon how much liquid they take in and how much liquid they are losing. If the temperature is hot and there is high humidity, then the person will sweat more and usually breathe a little more frequently. These factors decrease the amount of frequency of urination that would be considered normal. The amount you drink is easily determined, but the amount of fluid lost is sometimes more difficult to calculate.  Fluid is lost in two ways:  Sensible fluid loss is usually measured by the amount of urine that you get rid of. Losses of fluid can also occur with diarrhea.  Insensible fluid loss is more difficult to measure. It is caused by evaporation. Insensible loss of fluid occurs through breathing and sweating. It usually ranges from a little less than a quart to a little more than a quart of fluid a day. In normal temperatures and activity levels, the average person may urinate 4 to 7 times in a 24-hour period. Needing to urinate more often than that could indicate a problem. If one urinates 4 to 7 times in 24 hours and has large volumes each time, that could indicate a different problem from one who urinates 4 to 7 times a day and has small volumes. The time of urinating is also important.  Most urinating should be done during the waking hours. Getting up at night to urinate frequently can indicate some problems. CAUSES  The bladder is the organ in your lower abdomen that holds urine. Like a balloon, it swells some as it fills up. Your nerves sense this and tell you it is time to head for the bathroom. There are a number of reasons that you might feel the need to urinate more often than usual. They include:  Urinary tract infection. This is usually associated with other signs such as burning when you urinate.  In men, problems with the prostate (a walnut-size gland that is located near the tube that carries urine out of your body). There are two reasons why the prostate can cause an increased frequency of urination:  An enlarged prostate that does not let the bladder empty well. If the bladder only half empties when you urinate, then it only has half the capacity to fill before you have to urinate again.  The nerves in the bladder become more hypersensitive with an increased size of the prostate even if the bladder empties completely.  Pregnancy.  Obesity. Excess weight is more likely to cause a problem for women  than for men.  Bladder stones or other bladder problems.  Caffeine.  Alcohol.  Medications. For example, drugs that help the body get rid of extra fluid (diuretics) increase urine production. Some other medicines must be taken with lots of fluids.  Muscle or nerve weakness. This might be the result of a spinal cord injury, a stroke, multiple sclerosis, or Parkinson disease.  Long-standing diabetes can decrease the sensation of the bladder. This loss of sensation makes it harder to sense the bladder needs to be emptied. Over a period of years, the bladder is stretched out by constant overfilling. This weakens the bladder muscles so that the bladder does not empty well and has less capacity to fill with new urine.  Interstitial cystitis (also called painful bladder  syndrome). This condition develops because the tissues that line the inside of the bladder are inflamed (inflammation is the body's way of reacting to injury or infection). It causes pain and frequent urination. It occurs in women more often than in men. DIAGNOSIS   To decide what might be causing your urinary frequency, your health care provider will probably:  Ask about symptoms you have noticed.  Ask about your overall health. This will include questions about any medications you are taking.  Do a physical examination.  Order some tests. These might include:  A blood test to check for diabetes or other health issues that could be contributing to the problem.  Urine testing. This could measure the flow of urine and the pressure on the bladder.  A test of your neurological system (the brain, spinal cord, and nerves). This is the system that senses the need to urinate.  A bladder test to check whether it is emptying completely when you urinate.  Cystoscopy. This test uses a thin tube with a tiny camera on it. It offers a look inside your urethra and bladder to see if there are problems.  Imaging tests. You might be given a contrast dye and then asked to urinate. X-rays are taken to see how your bladder is working. TREATMENT  It is important for you to be evaluated to determine if the amount or frequency that you have is unusual or abnormal. If it is found to be abnormal, the cause should be determined and this can usually be found out easily. Depending upon the cause, treatment could include medication, stimulation of the nerves, or surgery. There are not too many things that you can do as an individual to change your urinary frequency. It is important that you balance the amount of fluid intake needed to compensate for your activity and the temperature. Medical problems will be diagnosed and taken care of by your physician. There is no particular bladder training such as Kegel exercises  that you can do to help urinary frequency. This is an exercise that is usually recommended for people who have leaking of urine when they laugh, cough, or sneeze. HOME CARE INSTRUCTIONS   Take any medications your health care provider prescribed or suggested. Follow the directions carefully.  Practice any lifestyle changes that are recommended. These might include:  Drinking less fluid or drinking at different times of the day. If you need to urinate often during the night, for example, you may need to stop drinking fluids early in the evening.  Cutting down on caffeine or alcohol. They both can make you need to urinate more often than normal. Caffeine is found in coffee, tea, and sodas.  Losing weight, if that is recommended.  Keep a journal or a log. You might be asked to record how much you drink and when and where you feel the need to urinate. This will also help evaluate how well the treatment provided by your physician is working. SEEK MEDICAL CARE IF:   Your need to urinate often gets worse.  You feel increased pain or irritation when you urinate.  You notice blood in your urine.  You have questions about any medications that your health care provider recommended.  You notice blood, pus, or swelling at the site of any test or treatment procedure.  You develop a fever of more than 100.68F (38.1C). SEEK IMMEDIATE MEDICAL CARE IF:  You develop a fever of more than 102.41F (38.9C).   This information is not intended to replace advice given to you by your health care provider. Make sure you discuss any questions you have with your health care provider.   Document Released: 03/16/2009 Document Revised: 06/10/2014 Document Reviewed: 03/16/2009 Elsevier Interactive Patient Education [employer]

## 2015-06-23 NOTE — Progress Notes (Signed)
Patient: Sherry Brooks Female    DOB: [birth year]   82 y.o.   MRN: [id number] Visit Date: 06/23/2015  Today's Provider: Margaretann Loveless, PA-C   Chief Complaint  Patient presents with  . Urinary Frequency    at night.   Subjective:    Urinary Frequency  This is a new problem. The current episode started 1 to 4 weeks ago. The problem occurs every urination (urgency and frequency to urinate during the day time). The problem has been gradually worsening. The patient is experiencing no pain. There has been no fever. Associated symptoms include frequency (Patients is wetting herself because is not able to make to the bathroom on time.) and urgency. Pertinent negatives include no chills, discharge, hematuria, hesitancy, nausea or vomiting. She has tried nothing for the symptoms. There is no history of catheterization, kidney stones, recurrent UTIs, a single kidney, urinary stasis or a urological procedure.       No Known Allergies Previous Medications   ALPRAZOLAM (XANAX) 0.5 MG TABLET    Take 0.5 mg by mouth at bedtime as needed.    ARTIFICIAL TEAR SOLUTION OP    Apply to eye.   B COMPLEX VITAMINS (VITAMIN B COMPLEX PO)       BUPROPION (WELLBUTRIN) 75 MG TABLET    Take 1 tablet (75 mg total) by mouth 2 (two) times daily. One a day for 7 days and then one twice a day.   CALCIUM CARBONATE (TUMS EX) 750 MG CHEWABLE TABLET    Chew 1 tablet by mouth daily as needed for heartburn.   CALCIUM CARBONATE-VITAMIN D (CALCIUM-VITAMIN D) 600-125 MG-UNIT TABS    Take by mouth.   LISINOPRIL-HYDROCHLOROTHIAZIDE (PRINZIDE,ZESTORETIC) 20-25 MG TABLET    TAKE 1 TABLET BY MOUTH EVERY DAY   MULTIPLE VITAMINS-MINERALS (PRESERVISION AREDS) CAPS    Take by mouth.   NAPROXEN SODIUM (ANAPROX) 220 MG TABLET    Take 220 mg by mouth 2 (two) times daily with a meal.   OLOPATADINE HCL (PATADAY) 0.2 % SOLN    Apply to eye.   PRAVASTATIN (PRAVACHOL) 10 MG TABLET    Take by mouth.   PSYLLIUM (METAMUCIL PO)     Take 1 capsule by mouth daily.   VITAMIN E PO    Take 1 tablet by mouth daily.    Review of Systems  Constitutional: Negative for fever and chills.  Eyes: Negative.   Respiratory: Negative.   Cardiovascular: Negative.   Gastrointestinal: Positive for abdominal pain (a little pain in the left side of the lower abdomen.; H/O diverticulitis requiring surgical removal of portion of colon.). Negative for nausea and vomiting.  Genitourinary: Positive for urgency and frequency (Patients is wetting herself because is not able to make to the bathroom on time.). Negative for hesitancy and hematuria.  Musculoskeletal: Negative.     Social History  Substance Use Topics  . Smoking status: Never Smoker   . Smokeless tobacco: Never Used  . Alcohol Use: Yes     Comment: glass of wine, rare occas.    Objective:   BP 140/80 mmHg  Pulse 83  Temp(Src) 97.6 F (36.4 C) (Oral)  Resp 16  Wt 214 lb 12.8 oz (97.433 kg)  Physical Exam  Constitutional: She is oriented to person, place, and time. She appears well-developed and well-nourished. No distress.  Cardiovascular: Normal rate, regular rhythm and normal heart sounds.  Exam reveals no gallop and no friction rub.   No murmur heard. Pulmonary/Chest: Effort  normal and breath sounds normal. No respiratory distress. She has no wheezes. She has no rales.  Abdominal: Soft. Normal appearance and bowel sounds are normal. She exhibits no distension and no mass. There is no hepatosplenomegaly. There is no tenderness. There is no rebound, no guarding and no CVA tenderness.  Neurological: She is alert and oriented to person, place, and time.  Skin: Skin is warm and dry. She is not diaphoretic.        Assessment & Plan:     1. Frequent urination Urinalysis was negative for UTI. Hemoglobin A1c was stable at 6.2. We discussed in detail that due to her age she may be having some weakness in the vaginal walls and possible cystocele. She has been seen by  urologist many years ago and was told even at that time she would not be a candidate for a surgical sling or bladder tack secondary to her age. We also discussed in detail medications that are used for urinary frequency and the adverse effects that may cause worsening dehydration and/or constipation. I do not feel that the medications would be a good choice for her secondary to risk outweighing the benefits. Her daughter agrees with this plan as well. I also advised that without treatment the frequency and incontinence may worsen. They both voiced understanding and agreed to try depends and poise pads at this time. I did advise to make sure that she changes them frequently to avoid any further urinary tract infection or vaginitis secondary to irritation. They both voiced understanding with this as well. I also did discuss with them possibly considering a pessary in the future if this worsens and becomes very problematic for her. They both agreed and stated they would call if they would like to consider this in the future. - POCT urinalysis dipstick - POCT HgB A1C       Margaretann Loveless, PA-C  [facility]

## 2015-07-24 DIAGNOSIS — H353212 Exudative age-related macular degeneration, right eye, with inactive choroidal neovascularization: Secondary | ICD-10-CM | POA: Diagnosis not present

## 2015-08-03 DIAGNOSIS — H01021 Squamous blepharitis right upper eyelid: Secondary | ICD-10-CM | POA: Diagnosis not present

## 2015-08-16 ENCOUNTER — Other Ambulatory Visit: Payer: Self-pay | Admitting: Ophthalmology

## 2015-08-16 DIAGNOSIS — H04203 Unspecified epiphora, bilateral lacrimal glands: Secondary | ICD-10-CM | POA: Diagnosis not present

## 2015-08-21 ENCOUNTER — Other Ambulatory Visit: Payer: Self-pay | Admitting: Family Medicine

## 2015-08-21 DIAGNOSIS — F32A Depression, unspecified: Secondary | ICD-10-CM

## 2015-08-21 DIAGNOSIS — F419 Anxiety disorder, unspecified: Principal | ICD-10-CM

## 2015-08-21 DIAGNOSIS — F329 Major depressive disorder, single episode, unspecified: Secondary | ICD-10-CM

## 2015-08-21 NOTE — Telephone Encounter (Signed)
Printed, please fax or call in to pharmacy. Thank you.   

## 2015-08-22 ENCOUNTER — Ambulatory Visit
Admission: RE | Admit: 2015-08-22 | Discharge: 2015-08-22 | Disposition: A | Discharge status: Home / Self Care | Payer: Medicare Other | Source: Ambulatory Visit | Attending: Ophthalmology | Admitting: Ophthalmology

## 2015-08-22 DIAGNOSIS — H04203 Unspecified epiphora, bilateral lacrimal glands: Secondary | ICD-10-CM | POA: Diagnosis not present

## 2015-08-22 DIAGNOSIS — H04223 Epiphora due to insufficient drainage, bilateral lacrimal glands: Secondary | ICD-10-CM | POA: Diagnosis not present

## 2015-08-22 MED ORDER — SODIUM PERTECHNETATE TC 99M INJECTION
1.0000 | Freq: Once | INTRAVENOUS | Status: AC | PRN
  Administered 2015-08-22: 0.5 via INTRAVENOUS

## 2015-08-28 ENCOUNTER — Encounter: Payer: Self-pay | Admitting: Podiatry

## 2015-08-28 ENCOUNTER — Ambulatory Visit (INDEPENDENT_AMBULATORY_CARE_PROVIDER_SITE_OTHER): Payer: Medicare Other | Admitting: Podiatry

## 2015-08-28 DIAGNOSIS — M79676 Pain in unspecified toe(s): Secondary | ICD-10-CM

## 2015-08-28 DIAGNOSIS — B351 Tinea unguium: Secondary | ICD-10-CM

## 2015-08-28 NOTE — Progress Notes (Signed)
She presents today chief complaint of painful toenails particularly the hallux right. She states they've been bothering her for quite some time but she was unable to get here to have them cut.  Objective: Vital signs are stable she is alert and oriented 3. Pulses are palpable. Her toenails are thick yellow dystrophic onychomycotic and painful palpation. The hallux right does demonstrate mild erythema some. Once a malodor.  Assessment: Paronychia hallux right. Pain in limb secondary to onychomycosis 1 through 5 bilateral.  Plan: Incision and drainage today with removal of portion of the nail hallux right. Abscess was present and cleaned out thoroughly. She tolerated this well without anesthetic. Debrided toenails 1 through 5 bilateral was a covered service secondary to pain.

## 2015-09-14 ENCOUNTER — Other Ambulatory Visit: Payer: Self-pay | Admitting: Family Medicine

## 2015-09-14 DIAGNOSIS — E7800 Pure hypercholesterolemia, unspecified: Secondary | ICD-10-CM

## 2015-09-28 DIAGNOSIS — M25552 Pain in left hip: Secondary | ICD-10-CM | POA: Diagnosis not present

## 2015-09-28 DIAGNOSIS — M5442 Lumbago with sciatica, left side: Secondary | ICD-10-CM | POA: Diagnosis not present

## 2015-09-28 DIAGNOSIS — G8929 Other chronic pain: Secondary | ICD-10-CM | POA: Diagnosis not present

## 2015-09-30 ENCOUNTER — Other Ambulatory Visit: Payer: Self-pay | Admitting: Family Medicine

## 2015-09-30 DIAGNOSIS — I10 Essential (primary) hypertension: Secondary | ICD-10-CM

## 2015-10-24 ENCOUNTER — Other Ambulatory Visit: Payer: Self-pay | Admitting: Family Medicine

## 2015-10-24 DIAGNOSIS — F419 Anxiety disorder, unspecified: Principal | ICD-10-CM

## 2015-10-24 DIAGNOSIS — F32A Depression, unspecified: Secondary | ICD-10-CM

## 2015-10-24 DIAGNOSIS — H04203 Unspecified epiphora, bilateral lacrimal glands: Secondary | ICD-10-CM | POA: Diagnosis not present

## 2015-10-24 DIAGNOSIS — F329 Major depressive disorder, single episode, unspecified: Secondary | ICD-10-CM

## 2015-11-22 ENCOUNTER — Telehealth: Payer: Self-pay | Admitting: Family Medicine

## 2015-11-22 NOTE — Telephone Encounter (Signed)
The alprazolam 0.5 has been denied coverage.  It has not met the non formulary requirements.  You can substitute lorazepam but it requires a PA.  Please call the provider number line  [phone]  Thanks. teri .

## 2015-11-22 NOTE — Telephone Encounter (Signed)
Patient not been seen by me since fall. Needs follow up to discuss medication change and need for Lorazepam for PA or use this visit as time to meet new provider. Thanks.

## 2015-11-23 NOTE — Telephone Encounter (Signed)
Appointment scheduled for tomorrow with you. Allene DillonEmily Drozdowski, CMA

## 2015-11-24 ENCOUNTER — Encounter: Payer: Self-pay | Admitting: Family Medicine

## 2015-11-24 ENCOUNTER — Ambulatory Visit (INDEPENDENT_AMBULATORY_CARE_PROVIDER_SITE_OTHER): Payer: Medicare Other | Admitting: Family Medicine

## 2015-11-24 ENCOUNTER — Other Ambulatory Visit: Payer: Self-pay | Admitting: Family Medicine

## 2015-11-24 VITALS — BP 116/62 | HR 76 | Temp 98.5°F | Resp 16

## 2015-11-24 DIAGNOSIS — R0602 Shortness of breath: Secondary | ICD-10-CM

## 2015-11-24 DIAGNOSIS — R600 Localized edema: Secondary | ICD-10-CM

## 2015-11-24 DIAGNOSIS — F32A Depression, unspecified: Secondary | ICD-10-CM

## 2015-11-24 DIAGNOSIS — F419 Anxiety disorder, unspecified: Principal | ICD-10-CM

## 2015-11-24 DIAGNOSIS — R2681 Unsteadiness on feet: Secondary | ICD-10-CM

## 2015-11-24 DIAGNOSIS — F329 Major depressive disorder, single episode, unspecified: Secondary | ICD-10-CM

## 2015-11-24 DIAGNOSIS — N183 Chronic kidney disease, stage 3 unspecified: Secondary | ICD-10-CM

## 2015-11-24 DIAGNOSIS — F418 Other specified anxiety disorders: Secondary | ICD-10-CM | POA: Diagnosis not present

## 2015-11-24 MED ORDER — LORAZEPAM 1 MG PO TABS: 1.0000 mg | ORAL_TABLET | Freq: Two times a day (BID) | ORAL | Status: DC | PRN

## 2015-11-24 NOTE — Progress Notes (Signed)
Patient: Sherry Brooks Female    DOB: 12/15/1928   82 y.o.   MRN: [id number] Visit Date: 11/24/2015  Today's Provider: Lorie PhenixNancy Wilmarie Sparlin, MD   Chief Complaint  Patient presents with  . Anxiety   Subjective:    Anxiety Presents for follow-up visit. Symptoms include shortness of breath (Only when she walks. ). Patient reports no chest pain, decreased concentration, depressed mood, dizziness, excessive worry, feeling of choking, insomnia, irritability, nervous/anxious behavior, palpitations or suicidal ideas. The quality of sleep is good.   Treatments tried: Wellbutrin and Alprazolam. The treatment provided significant relief. Compliance with prior treatments has been good.   Edema: Patient complains of edema. The location of the edema is lower leg(s) bilateral.  The edema has been off and on.  Onset of symptoms was several years ago, gradually worsening since that time. The edema is present all day. The patient states the problem is long-standing.  The swelling has been aggravated by sitting too long, relieved by support stockings, elevation of involved area, and been associated with nothing. Cardiac risk factors include advanced age (older than 5255 for men, 6465 for women), hypertension and sedentary lifestyle.     No Known Allergies Current Meds  Medication Sig  . ALPRAZolam (XANAX) 0.5 MG tablet TAKE 1/2 TO 1 TABLET BY MOUTH TWICE DAILY.  Marland Kitchen. ARTIFICIAL TEAR SOLUTION OP Apply to eye.  . B Complex Vitamins (VITAMIN B COMPLEX PO)   . buPROPion (WELLBUTRIN) 75 MG tablet Take 1 tablet (75 mg total) by mouth 2 (two) times daily.  . calcium carbonate (TUMS EX) 750 MG chewable tablet Chew 1 tablet by mouth daily as needed for heartburn.  . Calcium Carbonate-Vitamin D (CALCIUM-VITAMIN D) 600-125 MG-UNIT TABS Take by mouth.  Marland Kitchen. lisinopril-hydrochlorothiazide (PRINZIDE,ZESTORETIC) 20-25 MG tablet TAKE 1 TABLET BY MOUTH EVERY DAY.  . Multiple Vitamins-Minerals (PRESERVISION AREDS) CAPS  Take by mouth.  . naproxen sodium (ANAPROX) 220 MG tablet Take 220 mg by mouth 2 (two) times daily with a meal.  . Olopatadine HCl (PATADAY) 0.2 % SOLN Apply to eye.  . pravastatin (PRAVACHOL) 10 MG tablet TAKE 1 TABLET BY MOUTH EVERY NIGHT AT BEDTIME.  Marland Kitchen. Psyllium (METAMUCIL PO) Take 1 capsule by mouth daily.  Marland Kitchen. VITAMIN E PO Take 1 tablet by mouth daily.    Review of Systems  Constitutional: Positive for fatigue. Negative for fever, chills, diaphoresis, activity change, appetite change, irritability and unexpected weight change.  Respiratory: Positive for shortness of breath (Only when she walks. ). Negative for apnea, cough, choking, chest tightness, wheezing and stridor.   Cardiovascular: Positive for leg swelling. Negative for chest pain and palpitations.  Gastrointestinal: Negative.   Musculoskeletal: Positive for gait problem [facility](Walks with a cane.).  Neurological: Positive for weakness. Negative for dizziness, tremors, seizures, syncope, light-headedness and headaches.  Psychiatric/Behavioral: Negative.  Negative for suicidal ideas and decreased concentration. The patient is not nervous/anxious and does not have insomnia.        Pt reports her anxiety and depression is under good control with current medications.     Social History  Substance Use Topics  . Smoking status: Never Smoker   . Smokeless tobacco: Never Used  . Alcohol Use: Yes     Comment: glass of wine, rare occas.    Objective:   BP 116/62 mmHg  Pulse 76  Temp(Src) 98.5 F (36.9 C) (Oral)  Resp 16  Physical Exam  Constitutional: She is oriented to person, place, and time.  She appears well-developed and well-nourished.  Cardiovascular: Normal rate, regular rhythm and normal heart sounds.   Pulmonary/Chest: Effort normal and breath sounds normal.  Musculoskeletal: She exhibits edema (3+ Pitting Edema in lower legs.).  Neurological: She is alert and oriented to person, place, and time.  Psychiatric: She has a  normal mood and affect. Her behavior is normal. Judgment and thought content normal.        Assessment & Plan:     1. Anxiety and depression Stable; will try to D/C Alprazolam and start Lorazepam.    - LORazepam (ATIVAN) 1 MG tablet; Take 1 tablet (1 mg total) by mouth 2 (two) times daily as needed for anxiety.  Dispense: 180 tablet; Refill: 1 - TSH  2. Localized edema Worsening will check labs.  - TSH  3. Chronic kidney disease (CKD), stage III (moderate) Check labs.   - Comprehensive metabolic panel - CBC with Differential/Platelet  4. Gait instability Worsening will refer to in home physical therapy to evaluate and treat.  5. Shortness of breath Will check labs.   - Comprehensive metabolic panel - CBC with Differential/Platelet - B Nat Peptide  Patient was seen and examined by Leo GrosserNancy J. Jewelz Ricklefs, MD, and note scribed by Kavin LeechLaura Walsh, CMA.   I have reviewed the document for accuracy and completeness and I agree with above. - Leo GrosserNancy J. Kayra Crowell, MD      Lorie PhenixNancy [place] Dalporto, MD  [facility] .

## 2015-11-25 LAB — CBC WITH DIFFERENTIAL/PLATELET
BASOS: 0 %
Basophils Absolute: 0 10*3/uL (ref 0.0–0.2)
EOS (ABSOLUTE): 0.1 10*3/uL (ref 0.0–0.4)
EOS: 1 %
HEMATOCRIT: 39 % (ref 34.0–46.6)
HEMOGLOBIN: 12.7 g/dL (ref 11.1–15.9)
IMMATURE GRANS (ABS): 0 10*3/uL (ref 0.0–0.1)
Immature Granulocytes: 0 %
LYMPHS ABS: 2.1 10*3/uL (ref 0.7–3.1)
LYMPHS: 29 %
MCH: 28.2 pg (ref 26.6–33.0)
MCHC: 32.6 g/dL (ref 31.5–35.7)
MCV: 87 fL (ref 79–97)
MONOCYTES: 7 %
Monocytes Absolute: 0.5 10*3/uL (ref 0.1–0.9)
NEUTROS ABS: 4.6 10*3/uL (ref 1.4–7.0)
Neutrophils: 63 %
Platelets: 288 10*3/uL (ref 150–379)
RBC: 4.51 x10E6/uL (ref 3.77–5.28)
RDW: 14.5 % (ref 12.3–15.4)
WBC: 7.3 10*3/uL (ref 3.4–10.8)

## 2015-11-25 LAB — BRAIN NATRIURETIC PEPTIDE: BNP: 72.8 pg/mL (ref 0.0–100.0)

## 2015-11-25 LAB — TSH: TSH: 3.93 u[IU]/mL (ref 0.450–4.500)

## 2015-11-26 LAB — COMPREHENSIVE METABOLIC PANEL
A/G RATIO: 1.5 (ref 1.2–2.2)
ALT: 14 IU/L (ref 0–32)
AST: 15 IU/L (ref 0–40)
Albumin: 3.9 g/dL (ref 3.5–4.7)
Alkaline Phosphatase: 62 IU/L (ref 39–117)
BUN/Creatinine Ratio: 30 — ABNORMAL HIGH (ref 12–28)
BUN: 32 mg/dL — ABNORMAL HIGH (ref 8–27)
Bilirubin Total: <0.2 mg/dL (ref 0.0–1.2)
CALCIUM: 9.7 mg/dL (ref 8.7–10.3)
CO2: 26 mmol/L (ref 18–29)
CREATININE: 1.06 mg/dL — AB (ref 0.57–1.00)
Chloride: 104 mmol/L (ref 96–106)
GFR, EST AFRICAN AMERICAN: 55 mL/min/{1.73_m2} — AB (ref >59)
GFR, EST NON AFRICAN AMERICAN: 48 mL/min/{1.73_m2} — AB (ref >59)
GLOBULIN, TOTAL: 2.6 g/dL (ref 1.5–4.5)
Glucose: 119 mg/dL — ABNORMAL HIGH (ref 65–99)
POTASSIUM: 4.5 mmol/L (ref 3.5–5.2)
SODIUM: 143 mmol/L (ref 134–144)
TOTAL PROTEIN: 6.5 g/dL (ref 6.0–8.5)

## 2015-11-27 ENCOUNTER — Telehealth: Payer: Self-pay

## 2015-11-27 ENCOUNTER — Telehealth: Payer: Self-pay | Admitting: Family Medicine

## 2015-11-27 NOTE — Telephone Encounter (Signed)
LMTCB 11/27/2015  Thanks,   -Bailley Guilford  

## 2015-11-27 NOTE — Telephone Encounter (Signed)
-----   Message from Lorie PhenixNancy Maloney, MD sent at 11/26/2015  6:51 AM EDT ----- Labs stable. Please notify patient. Thanks.

## 2015-11-27 NOTE — Telephone Encounter (Signed)
Donna advised of labs.  (On DPR).  Thanks,   -Jailah Willis  

## 2015-11-27 NOTE — Telephone Encounter (Signed)
Pt's daughter in [place] Donna Gervais returned phone call.Call back # [phone]

## 2015-11-27 NOTE — Telephone Encounter (Signed)
[name] advised of labs.  (On DPR).  Thanks,   -Vernona RiegerLaura

## 2015-11-29 ENCOUNTER — Ambulatory Visit (INDEPENDENT_AMBULATORY_CARE_PROVIDER_SITE_OTHER): Payer: Medicare Other | Admitting: Podiatry

## 2015-11-29 ENCOUNTER — Encounter: Payer: Self-pay | Admitting: Podiatry

## 2015-11-29 DIAGNOSIS — B351 Tinea unguium: Secondary | ICD-10-CM | POA: Diagnosis not present

## 2015-11-29 DIAGNOSIS — M79676 Pain in unspecified toe(s): Secondary | ICD-10-CM

## 2015-11-29 NOTE — Progress Notes (Signed)
She presents today vital signs stable alert and oriented 3 thick yellow dystrophic mycotic nails which are painful.  Objective: Pulses are palpable. No open lesions or wounds. Toenails are thick and mycotic and painful palpation as well as debridement.  Assessment: Pain in limb secondary onychomycosis.  Plan: Debridement of toenails 1 through 5 bilateral.

## 2015-12-07 ENCOUNTER — Telehealth: Payer: Self-pay | Admitting: Family Medicine

## 2015-12-07 DIAGNOSIS — R350 Frequency of micturition: Secondary | ICD-10-CM

## 2015-12-07 DIAGNOSIS — R32 Unspecified urinary incontinence: Secondary | ICD-10-CM

## 2015-12-07 DIAGNOSIS — R2681 Unsteadiness on feet: Secondary | ICD-10-CM

## 2015-12-07 NOTE — Telephone Encounter (Signed)
Pt's daughter in law Sherry Leash(Donna) called stating that Dr Elease HashimotoMaloney was going to order home physical therapy for gait instability and also refer to urology for incontinence,urinary frequency.Call back # [phone].There are no orders in chart

## 2015-12-08 NOTE — Telephone Encounter (Signed)
There is an ov note from 11/24/15 by Dr. Elease HashimotoMaloney stating that she was going to refer pt to home physical therapy to evaluate and treat. Patient saw Joycelyn ManJennifer Burnette 06/23/15 for for frequent urination, referral to urology was discussed but only as a possibility.

## 2015-12-08 NOTE — Telephone Encounter (Signed)
Orders placed for [facility] and for urology

## 2015-12-08 NOTE — Telephone Encounter (Signed)
Sherry Brooks was notified orders are in progress.

## 2015-12-13 ENCOUNTER — Encounter: Payer: Self-pay | Admitting: Physician Assistant

## 2015-12-13 ENCOUNTER — Ambulatory Visit (INDEPENDENT_AMBULATORY_CARE_PROVIDER_SITE_OTHER): Payer: Medicare Other | Admitting: Physician Assistant

## 2015-12-13 VITALS — BP 140/70 | HR 67 | Temp 98.0°F | Resp 16

## 2015-12-13 DIAGNOSIS — F411 Generalized anxiety disorder: Secondary | ICD-10-CM | POA: Diagnosis not present

## 2015-12-13 DIAGNOSIS — R350 Frequency of micturition: Secondary | ICD-10-CM | POA: Diagnosis not present

## 2015-12-13 MED ORDER — ALPRAZOLAM 0.5 MG PO TABS: 0.2500 mg | ORAL_TABLET | Freq: Two times a day (BID) | ORAL | Status: DC | PRN

## 2015-12-13 NOTE — Patient Instructions (Signed)
Alprazolam tablets What is this medicine? ALPRAZOLAM (al PRAY zoe lam) is a benzodiazepine. It is used to treat anxiety and panic attacks. This medicine may be used for other purposes; ask your health care provider or pharmacist if you have questions. What should I tell my health care provider before I take this medicine? They need to know if you have any of these conditions: -an alcohol or drug abuse problem -bipolar disorder, depression, psychosis or other mental health conditions -glaucoma -kidney or liver disease -lung or breathing disease -myasthenia gravis -Parkinson's disease -porphyria -seizures or a history of seizures -suicidal thoughts -an unusual or allergic reaction to alprazolam, other benzodiazepines, foods, dyes, or preservatives -pregnant or trying to get pregnant -breast-feeding How should I use this medicine? Take this medicine by mouth with a glass of water. Follow the directions on the prescription label. Take your medicine at regular intervals. Do not take it more often than directed. If you have been taking this medicine regularly for some time, do not suddenly stop taking it. You must gradually reduce the dose or you may get severe side effects. Ask your doctor or health care professional for advice. Even after you stop taking this medicine it can still affect your body for several days. Talk to your pediatrician regarding the use of this medicine in children. Special care may be needed. Overdosage: If you think you have taken too much of this medicine contact a poison control center or emergency room at once. NOTE: This medicine is only for you. Do not share this medicine with others. What if I miss a dose? If you miss a dose, take it as soon as you can. If it is almost time for your next dose, take only that dose. Do not take double or extra doses. What may interact with this medicine? Do not take this medicine with any of the following medications: -certain  medicines for HIV infection or AIDS -ketoconazole -itraconazole This medicine may also interact with the following medications: -birth control pills -certain macrolide antibiotics like clarithromycin, erythromycin, troleandomycin -cimetidine -cyclosporine -ergotamine -grapefruit juice -herbal or dietary supplements like kava kava, melatonin, dehydroepiandrosterone, DHEA, St. John's Wort or valerian -imatinib, STI-571 -isoniazid -levodopa -medicines for depression, anxiety, or psychotic disturbances -prescription pain medicines -rifampin, rifapentine, or rifabutin -some medicines for blood pressure or heart problems -some medicines for seizures like carbamazepine, oxcarbazepine, phenobarbital, phenytoin, primidone This list may not describe all possible interactions. Give your health care provider a list of all the medicines, herbs, non-prescription drugs, or dietary supplements you use. Also tell them if you smoke, drink alcohol, or use illegal drugs. Some items may interact with your medicine. What should I watch for while using this medicine? Visit your doctor or health care professional for regular checks on your progress. Your body can become dependent on this medicine. Ask your doctor or health care professional if you still need to take it. You may get drowsy or dizzy. Do not drive, use machinery, or do anything that needs mental alertness until you know how this medicine affects you. To reduce the risk of dizzy and fainting spells, do not stand or sit up quickly, especially if you are an older patient. Alcohol may increase dizziness and drowsiness. Avoid alcoholic drinks. Do not treat yourself for coughs, colds or allergies without asking your doctor or health care professional for advice. Some ingredients can increase possible side effects. What side effects may I notice from receiving this medicine? Side effects that you should report to your   doctor or health care professional as  soon as possible: -allergic reactions like skin rash, itching or hives, swelling of the face, lips, or tongue -confusion, forgetfulness -depression -difficulty sleeping -difficulty speaking -feeling faint or lightheaded, falls -mood changes, excitability or aggressive behavior -muscle cramps -trouble passing urine or change in the amount of urine -unusually weak or tired Side effects that usually do not require medical attention (report to your doctor or health care professional if they continue or are bothersome): -change in sex drive or performance -changes in appetite This list may not describe all possible side effects. Call your doctor for medical advice about side effects. You may report side effects to FDA at 1-800-FDA-1088. Where should I keep my medicine? Keep out of the reach of children. This medicine can be abused. Keep your medicine in a safe place to protect it from theft. Do not share this medicine with anyone. Selling or giving away this medicine is dangerous and against the law. Store at room temperature between 20 and 25 degrees C (68 and 77 degrees F). This medicine may cause accidental overdose and death if taken by other adults, children, or pets. Mix any unused medicine with a substance like cat litter or coffee grounds. Then throw the medicine away in a sealed container like a sealed bag or a coffee can with a lid. Do not use the medicine after the expiration date. NOTE: This sheet is a summary. It may not cover all possible information. If you have questions about this medicine, talk to your doctor, pharmacist, or health care provider.    2016, Elsevier/Gold Standard. (2014-02-08 14:51:36) Urinary Frequency The number of times a normal person urinates depends upon how much liquid they take in and how much liquid they are losing. If the temperature is hot and there is high humidity, then the person will sweat more and usually breathe a little more frequently. These  factors decrease the amount of frequency of urination that would be considered normal. The amount you drink is easily determined, but the amount of fluid lost is sometimes more difficult to calculate.  Fluid is lost in two ways:  Sensible fluid loss is usually measured by the amount of urine that you get rid of. Losses of fluid can also occur with diarrhea.  Insensible fluid loss is more difficult to measure. It is caused by evaporation. Insensible loss of fluid occurs through breathing and sweating. It usually ranges from a little less than a quart to a little more than a quart of fluid a day. In normal temperatures and activity levels, the average person may urinate 4 to 7 times in a 24-hour period. Needing to urinate more often than that could indicate a problem. If one urinates 4 to 7 times in 24 hours and has large volumes each time, that could indicate a different problem from one who urinates 4 to 7 times a day and has small volumes. The time of urinating is also important. Most urinating should be done during the waking hours. Getting up at night to urinate frequently can indicate some problems. CAUSES  The bladder is the organ in your lower abdomen that holds urine. Like a balloon, it swells some as it fills up. Your nerves sense this and tell you it is time to head for the bathroom. There are a number of reasons that you might feel the need to urinate more often than usual. They include:  Urinary tract infection. This is usually associated with other signs such as  burning when you urinate.  In men, problems with the prostate (a walnut-size gland that is located near the tube that carries urine out of your body). There are two reasons why the prostate can cause an increased frequency of urination:  An enlarged prostate that does not let the bladder empty well. If the bladder only half empties when you urinate, then it only has half the capacity to fill before you have to urinate again.  The  nerves in the bladder become more hypersensitive with an increased size of the prostate even if the bladder empties completely.  Pregnancy.  Obesity. Excess weight is more likely to cause a problem for women than for men.  Bladder stones or other bladder problems.  Caffeine.  Alcohol.  Medications. For example, drugs that help the body get rid of extra fluid (diuretics) increase urine production. Some other medicines must be taken with lots of fluids.  Muscle or nerve weakness. This might be the result of a spinal cord injury, a stroke, multiple sclerosis, or Parkinson disease.  Long-standing diabetes can decrease the sensation of the bladder. This loss of sensation makes it harder to sense the bladder needs to be emptied. Over a period of years, the bladder is stretched out by constant overfilling. This weakens the bladder muscles so that the bladder does not empty well and has less capacity to fill with new urine.  Interstitial cystitis (also called painful bladder syndrome). This condition develops because the tissues that line the inside of the bladder are inflamed (inflammation is the body's way of reacting to injury or infection). It causes pain and frequent urination. It occurs in women more often than in men. DIAGNOSIS   To decide what might be causing your urinary frequency, your health care provider will probably:  Ask about symptoms you have noticed.  Ask about your overall health. This will include questions about any medications you are taking.  Do a physical examination.  Order some tests. These might include:  A blood test to check for diabetes or other health issues that could be contributing to the problem.  Urine testing. This could measure the flow of urine and the pressure on the bladder.  A test of your neurological system (the brain, spinal cord, and nerves). This is the system that senses the need to urinate.  A bladder test to check whether it is emptying  completely when you urinate.  Cystoscopy. This test uses a thin tube with a tiny camera on it. It offers a look inside your urethra and bladder to see if there are problems.  Imaging tests. You might be given a contrast dye and then asked to urinate. X-rays are taken to see how your bladder is working. TREATMENT  It is important for you to be evaluated to determine if the amount or frequency that you have is unusual or abnormal. If it is found to be abnormal, the cause should be determined and this can usually be found out easily. Depending upon the cause, treatment could include medication, stimulation of the nerves, or surgery. There are not too many things that you can do as an individual to change your urinary frequency. It is important that you balance the amount of fluid intake needed to compensate for your activity and the temperature. Medical problems will be diagnosed and taken care of by your physician. There is no particular bladder training such as Kegel exercises that you can do to help urinary frequency. This is an exercise that is  usually recommended for people who have leaking of urine when they laugh, cough, or sneeze. HOME CARE INSTRUCTIONS   Take any medications your health care provider prescribed or suggested. Follow the directions carefully.  Practice any lifestyle changes that are recommended. These might include:  Drinking less fluid or drinking at different times of the day. If you need to urinate often during the night, for example, you may need to stop drinking fluids early in the evening.  Cutting down on caffeine or alcohol. They both can make you need to urinate more often than normal. Caffeine is found in coffee, tea, and sodas.  Losing weight, if that is recommended.  Keep a journal or a log. You might be asked to record how much you drink and when and where you feel the need to urinate. This will also help evaluate how well the treatment provided by your physician  is working. SEEK MEDICAL CARE IF:   Your need to urinate often gets worse.  You feel increased pain or irritation when you urinate.  You notice blood in your urine.  You have questions about any medications that your health care provider recommended.  You notice blood, pus, or swelling at the site of any test or treatment procedure.  You develop a fever of more than 100.58F (38.1C). SEEK IMMEDIATE MEDICAL CARE IF:  You develop a fever of more than 102.70F (38.9C).   This information is not intended to replace advice given to you by your health care provider. Make sure you discuss any questions you have with your health care provider.   Document Released: 03/16/2009 Document Revised: 06/10/2014 Document Reviewed: 03/16/2009 Elsevier Interactive Patient Education [employer]

## 2015-12-13 NOTE — Progress Notes (Signed)
Patient: Sherry Brooks Female    DOB: 1928/11/11   82 y.o.   MRN: [id number] Visit Date: 12/13/2015  Today's Provider: Margaretann LovelessJennifer M Burnette, PA-C   Chief Complaint  Patient presents with  . Medication Problem   Subjective:    HPI Patient is here today to discuss medication. She reports that her balance is off since she started the Lorazepam. She has fallen twice, feeling very cold and a little lightheadedness when the episodes happened last Thursday (first fall) and Sunday (second fall). She was doing better on the alprazolam report patient and daughter. She denies chest pain or palpitation. The patient has a history of falls in the last year but never this many.  Fall Risk  12/13/2015 03/28/2015  Falls in the past year? Yes No  Number falls in past yr: 2 or more -  Injury with Fall? (No Data) -       No Known Allergies Current Meds  Medication Sig  . ARTIFICIAL TEAR SOLUTION OP Apply to eye.  . B Complex Vitamins (VITAMIN B COMPLEX PO)   . buPROPion (WELLBUTRIN) 75 MG tablet Take 1 tablet (75 mg total) by mouth 2 (two) times daily.  . calcium carbonate (TUMS EX) 750 MG chewable tablet Chew 1 tablet by mouth daily as needed for heartburn.  . Calcium Carbonate-Vitamin D (CALCIUM-VITAMIN D) 600-125 MG-UNIT TABS Take by mouth.  Marland Kitchen. lisinopril-hydrochlorothiazide (PRINZIDE,ZESTORETIC) 20-25 MG tablet TAKE 1 TABLET BY MOUTH EVERY DAY.  . Multiple Vitamins-Minerals (PRESERVISION AREDS) CAPS Take by mouth.  . naproxen sodium (ANAPROX) 220 MG tablet Take 220 mg by mouth 2 (two) times daily with a meal.  . Olopatadine HCl (PATADAY) 0.2 % SOLN Apply to eye.  . pravastatin (PRAVACHOL) 10 MG tablet TAKE 1 TABLET BY MOUTH EVERY NIGHT AT BEDTIME.  Marland Kitchen. Psyllium (METAMUCIL PO) Take 1 capsule by mouth daily.  Marland Kitchen. VITAMIN E PO Take 1 tablet by mouth daily.    Review of Systems  Constitutional: Positive for fatigue.  HENT: Negative.   Respiratory: Negative for cough, chest tightness  and shortness of breath.   Cardiovascular: Negative for chest pain, palpitations and leg swelling.  Gastrointestinal: Negative for abdominal pain.  Endocrine: Positive for cold intolerance (this morning).  Neurological: Positive for weakness and light-headedness. Negative for dizziness, syncope, numbness and headaches.    Social History  Substance Use Topics  . Smoking status: Never Smoker   . Smokeless tobacco: Never Used  . Alcohol Use: Yes     Comment: glass of wine, rare occas.    Objective:   BP 140/70 mmHg  Pulse 67  Temp(Src) 98 F (36.7 C) (Oral)  Resp 16  Wt   Physical Exam  Constitutional: She appears well-developed and well-nourished. No distress.  Neck: Normal range of motion. Neck supple. No tracheal deviation present. No thyromegaly present.  Cardiovascular: Normal rate, regular rhythm and normal heart sounds.  Exam reveals no gallop and no friction rub.   No murmur heard. Pulmonary/Chest: Effort normal and breath sounds normal. No respiratory distress. She has no wheezes. She has no rales.  Musculoskeletal: She exhibits edema.  Lymphadenopathy:    She has no cervical adenopathy.  Skin: She is not diaphoretic.  Psychiatric: She has a normal mood and affect. Her behavior is normal.  Vitals reviewed.     Assessment & Plan:     1. GAD (generalized anxiety disorder) Patient already has unsteady gait that was worsened with lorazepam. She did not take today  and noticed feeling a little better already. States she would like to go back on alprazolam because that was working better for her. I do not know if it was truly the medicine or if because it was a higher dose (  BID prn). Either way, will switch back to alprazolam. She is to call if symptoms do not improve. - ALPRAZolam (XANAX) 0.5 MG tablet; Take 0.5-1 tablets (0.25-0.5 mg total) by mouth 2 (two) times daily as needed for anxiety.  Dispense: 60 tablet; Refill: 5  2. Urine frequency Complains of urine  frequency but unable to obtain specimen today. Daughter states her urine before coming in had been clear with no odor. She will monitor for signs/symptoms of UTI and call if they occur. Has appt with Urology on 01/04/16 to evaluate this.       Margaretann Loveless, PA-C  [facility]

## 2015-12-15 DIAGNOSIS — M25562 Pain in left knee: Secondary | ICD-10-CM | POA: Diagnosis not present

## 2015-12-15 DIAGNOSIS — N183 Chronic kidney disease, stage 3 (moderate): Secondary | ICD-10-CM | POA: Diagnosis not present

## 2015-12-15 DIAGNOSIS — R2689 Other abnormalities of gait and mobility: Secondary | ICD-10-CM | POA: Diagnosis not present

## 2015-12-15 DIAGNOSIS — M6281 Muscle weakness (generalized): Secondary | ICD-10-CM | POA: Diagnosis not present

## 2015-12-15 DIAGNOSIS — G8929 Other chronic pain: Secondary | ICD-10-CM | POA: Diagnosis not present

## 2015-12-15 DIAGNOSIS — F418 Other specified anxiety disorders: Secondary | ICD-10-CM | POA: Diagnosis not present

## 2015-12-15 DIAGNOSIS — R296 Repeated falls: Secondary | ICD-10-CM | POA: Diagnosis not present

## 2015-12-19 DIAGNOSIS — D485 Neoplasm of uncertain behavior of skin: Secondary | ICD-10-CM | POA: Diagnosis not present

## 2015-12-19 DIAGNOSIS — D0422 Carcinoma in situ of skin of left ear and external auricular canal: Secondary | ICD-10-CM | POA: Diagnosis not present

## 2015-12-19 DIAGNOSIS — Z85828 Personal history of other malignant neoplasm of skin: Secondary | ICD-10-CM | POA: Diagnosis not present

## 2016-01-02 ENCOUNTER — Ambulatory Visit: Payer: Medicare Other

## 2016-01-03 ENCOUNTER — Ambulatory Visit: Payer: Self-pay | Admitting: Family Medicine

## 2016-01-04 ENCOUNTER — Ambulatory Visit (INDEPENDENT_AMBULATORY_CARE_PROVIDER_SITE_OTHER): Payer: Medicare Other | Admitting: Urology

## 2016-01-04 ENCOUNTER — Encounter: Payer: Self-pay | Admitting: Urology

## 2016-01-04 VITALS — BP 164/90 | HR 76 | Ht 64.0 in | Wt 215.1 lb

## 2016-01-04 DIAGNOSIS — N3281 Overactive bladder: Secondary | ICD-10-CM

## 2016-01-04 DIAGNOSIS — R350 Frequency of micturition: Secondary | ICD-10-CM | POA: Diagnosis not present

## 2016-01-04 LAB — URINALYSIS, COMPLETE
BILIRUBIN UA: NEGATIVE
Glucose, UA: NEGATIVE
KETONES UA: NEGATIVE
Nitrite, UA: NEGATIVE
PH UA: 6.5 (ref 5.0–7.5)
Protein, UA: NEGATIVE
RBC, UA: NEGATIVE
Specific Gravity, UA: 1.01 (ref 1.005–1.030)
Urobilinogen, Ur: 0.2 mg/dL (ref 0.2–1.0)

## 2016-01-04 LAB — MICROSCOPIC EXAMINATION
RBC, UA: NONE SEEN /hpf (ref 0–?)
WBC, UA: >30 /hpf — AB (ref 0–?)

## 2016-01-04 MED ORDER — MIRABEGRON ER 25 MG PO TB24
25.0000 mg | ORAL_TABLET | Freq: Every day | ORAL | 11 refills | Status: DC
Start: 2016-01-04 — End: 2016-02-20

## 2016-01-04 NOTE — Progress Notes (Signed)
01/04/2016 11:24 AM   Sherry Brooks 12-29-1928 [id number]  Referring provider: Margaretann Loveless, PA-C [address] [place], [place] [address]  Chief Complaint  Patient presents with  . Urinary Frequency    New Patient    HPI: The patient is an 82 year old female who presents for evaluation of urinary frequency. She voids every 30 minutes during the day. She feels that she empties her bladder. His extreme urgency when she needs to void. She does not typically get urge incontinence. She denies leakage of urine with coughing or sneezing. She is never seen a urologist before. She has no history of hematuria. She feels that she empties her bladder. Her PVR today is 38 cc.    PMH: Past Medical History:  Diagnosis Date  . Anxiety   . Arthritis    knees  . Borderline diabetic   . Complication of anesthesia    wakes up slowly, takes longer than usual to wake  . Depression   . Diabetes (HCC)   . GERD (gastroesophageal reflux disease)    on occas. uses TUMS for indigestion   . Heart murmur   . History of stress test    10 yrs. ago, states it was normal , had been ordered for pt. due to stress related to husband's illness,, by Dr. Vic Ripper  . Hyperlipemia   . Hypertension   . Macular degeneration   . Peripheral vascular disease (HCC)    blood clots in legs mid 2000's   . Renal calculus    history of > 50 yrs. ago    Surgical History: Past Surgical History:  Procedure Laterality Date  . APPENDECTOMY     as a teenager   . BIOPSY THYROID  06/2008  . COLON SURGERY     due to diverticulitis- had colostomy, then takedown   . EYE SURGERY     cataracts removed - /w IOL  . FRACTURE SURGERY     L wrist, plate in place  . JOINT REPLACEMENT     both knees   . SPINE SURGERY  10/23/2012   Spinal Fusion, lower back    Home Medications:    Medication List       Accurate as of 01/04/16 11:24 AM. Always use your most recent med list.            ALPRAZolam 0.5 MG tablet Commonly known as:  XANAX Take 0.5-1 tablets (0.25-0.5 mg total) by mouth 2 (two) times daily as needed for anxiety.   ARTIFICIAL TEAR SOLUTION OP Apply to eye.   buPROPion 75 MG tablet Commonly known as:  WELLBUTRIN Take 1 tablet (75 mg total) by mouth 2 (two) times daily.   calcium carbonate 750 MG chewable tablet Commonly known as:  TUMS EX Chew 1 tablet by mouth daily as needed for heartburn.   Calcium-Vitamin D 600-125 MG-UNIT Tabs Take by mouth.   lisinopril-hydrochlorothiazide 20-25 MG tablet Commonly known as:  PRINZIDE,ZESTORETIC TAKE 1 TABLET BY MOUTH EVERY DAY.   METAMUCIL PO Take 1 capsule by mouth daily.   mirabegron ER 25 MG Tb24 tablet Commonly known as:  MYRBETRIQ Take 1 tablet (25 mg total) by mouth daily.   naproxen sodium 220 MG tablet Commonly known as:  ANAPROX Take 220 mg by mouth 2 (two) times daily with a meal.   PATADAY 0.2 % Soln Generic drug:  Olopatadine HCl Apply to eye.   pravastatin 10 MG tablet Commonly known as:  PRAVACHOL TAKE 1 TABLET BY  MOUTH EVERY NIGHT AT BEDTIME.   PRESERVISION AREDS Caps Take by mouth.   VITAMIN B COMPLEX PO   VITAMIN E PO Take 1 tablet by mouth daily.       Allergies: No Known Allergies  Family History: Family History  Problem Relation Age of Onset  . Arthritis Mother   . Arthritis Father   . Bladder Cancer Neg Hx   . Kidney cancer Neg Hx   . Prostate cancer Neg Hx     Social History:  reports that she has never smoked. She has never used smokeless tobacco. She reports that she drinks alcohol. She reports that she does not use drugs.  ROS: UROLOGY Frequent Urination?: Yes Hard to postpone urination?: Yes Burning/pain with urination?: No Get up at night to urinate?: Yes Leakage of urine?: Yes Urine stream starts and stops?: No Trouble starting stream?: No Do you have to strain to urinate?: No Blood in urine?: No Urinary tract infection?: No Sexually  transmitted disease?: No Injury to kidneys or bladder?: No Painful intercourse?: No Weak stream?: Yes Currently pregnant?: No Vaginal bleeding?: No Last menstrual period?: n  Gastrointestinal Nausea?: No Vomiting?: No Indigestion/heartburn?: No Diarrhea?: No Constipation?: Yes  Constitutional Fever: No Night sweats?: No Weight loss?: No Fatigue?: Yes  Skin Skin rash/lesions?: No Itching?: No  Eyes Blurred vision?: Yes Double vision?: No  Ears/Nose/Throat Sore throat?: No Sinus problems?: No  Hematologic/Lymphatic Swollen glands?: No Easy bruising?: Yes  Cardiovascular Leg swelling?: Yes Chest pain?: No  Respiratory Cough?: No Shortness of breath?: Yes  Endocrine Excessive thirst?: No  Musculoskeletal Back pain?: Yes Joint pain?: Yes  Neurological Headaches?: No Dizziness?: No  Psychologic Depression?: Yes Anxiety?: Yes  Physical Exam: BP (!) 164/90   Pulse 76   Ht  (1.626 m)   Wt 215 lb 1.6 oz (97.6 kg)   BMI 36.92 kg/m   Constitutional:  Alert and oriented, No acute distress. HEENT: [place] AT, moist mucus membranes.  Trachea midline, no masses. Cardiovascular: No clubbing, cyanosis, or edema. Respiratory: Normal respiratory effort, no increased work of breathing. GI: Abdomen is soft, nontender, nondistended, no abdominal masses GU: No CVA tenderness.  Skin: No rashes, bruises or suspicious lesions. Lymph: No cervical or inguinal adenopathy. Neurologic: Grossly intact, no focal deficits, moving all 4 extremities. Psychiatric: Normal mood and affect.  Laboratory Data: Lab Results  Component Value Date   WBC 7.3 11/24/2015   HGB 13.4 02/10/2015   HCT 39.0 11/24/2015   MCV 87 11/24/2015   PLT 288 11/24/2015    Lab Results  Component Value Date   CREATININE 1.06 (H) 11/24/2015    No results found for: PSA  No results found for: TESTOSTERONE  Lab Results  Component Value Date   HGBA1C 6.2 06/23/2015    Urinalysis      Component Value Date/Time   COLORURINE YELLOW 02/10/2015 1141   APPEARANCEUR CLEAR 02/10/2015 1141   APPEARANCEUR CLEAR 09/16/2014 0941   LABSPEC 1.020 02/10/2015 1141   LABSPEC 1.015 09/16/2014 0941   PHURINE 7.5 02/10/2015 1141   GLUCOSEU NEGATIVE 02/10/2015 1141   GLUCOSEU NEGATIVE 09/16/2014 0941   HGBUR NEGATIVE 02/10/2015 1141   BILIRUBINUR Negative 06/23/2015 1120   BILIRUBINUR NEGATIVE 09/16/2014 0941   KETONESUR NEGATIVE 02/10/2015 1141   PROTEINUR Negative 06/23/2015 1120   PROTEINUR NEGATIVE 02/10/2015 1141   UROBILINOGEN 0.2 06/23/2015 1120   UROBILINOGEN 0.2 10/25/2012 1544   NITRITE Negative 06/23/2015 1120   NITRITE NEGATIVE 02/10/2015 1141   LEUKOCYTESUR Negative 06/23/2015 1120  LEUKOCYTESUR TRACE 09/16/2014 0941      Assessment & Plan:    1. Overactive bladder We'll start the patient on Mybetriq 25 mg daily. A prescription was sent to her pharmacy. I do not think she would be a good candidate for anticholinergics due to her age and baseline mental status. She also has severe constipation at baseline which also makes her a poor candidate for anticholinergics. She'll follow-up in 3 months for [place] to assess her progress.   Return in about 3 months (around 04/05/2016).  Hildred Laser, MD  [facility] [address] [place], [place] [address] [phone]

## 2016-01-05 ENCOUNTER — Ambulatory Visit: Payer: Medicare Other | Admitting: Family Medicine

## 2016-01-23 ENCOUNTER — Telehealth: Payer: Self-pay

## 2016-01-23 NOTE — Telephone Encounter (Signed)
Please review-aa 

## 2016-01-23 NOTE — Telephone Encounter (Signed)
Aldrin (Physical Therapist) is needing a verbal for home physical therapy 2 times a week for three weeks.  His contact number is [phone]   Thanks,   -Vernona RiegerLaura

## 2016-01-24 NOTE — Telephone Encounter (Signed)
Yes this is ok to give verbal consent.

## 2016-01-24 NOTE — Telephone Encounter (Signed)
Aldrin advised-aa 

## 2016-01-24 NOTE — Telephone Encounter (Signed)
Please review-aa 

## 2016-01-30 DIAGNOSIS — M6281 Muscle weakness (generalized): Secondary | ICD-10-CM | POA: Diagnosis not present

## 2016-01-30 DIAGNOSIS — M25562 Pain in left knee: Secondary | ICD-10-CM | POA: Diagnosis not present

## 2016-01-30 DIAGNOSIS — R2689 Other abnormalities of gait and mobility: Secondary | ICD-10-CM | POA: Diagnosis not present

## 2016-01-30 DIAGNOSIS — R296 Repeated falls: Secondary | ICD-10-CM | POA: Diagnosis not present

## 2016-02-08 ENCOUNTER — Telehealth: Payer: Self-pay | Admitting: Physician Assistant

## 2016-02-08 NOTE — Telephone Encounter (Signed)
Aldrin with [facility] wants to get orders for pt to receive PT 2 times weekly for 4 more weeks.  Please Call [phone]  Thanks Barth Kirkseri

## 2016-02-08 NOTE — Telephone Encounter (Signed)
Ok to give verbal for PT continuation  

## 2016-02-09 DIAGNOSIS — L905 Scar conditions and fibrosis of skin: Secondary | ICD-10-CM | POA: Diagnosis not present

## 2016-02-09 DIAGNOSIS — D0439 Carcinoma in situ of skin of other parts of face: Secondary | ICD-10-CM | POA: Diagnosis not present

## 2016-02-09 DIAGNOSIS — D0422 Carcinoma in situ of skin of left ear and external auricular canal: Secondary | ICD-10-CM | POA: Diagnosis not present

## 2016-02-09 NOTE — Telephone Encounter (Signed)
LMTCB

## 2016-02-09 NOTE — Telephone Encounter (Signed)
Advised Aldrin as below.  

## 2016-02-14 DIAGNOSIS — N183 Chronic kidney disease, stage 3 (moderate): Secondary | ICD-10-CM | POA: Diagnosis not present

## 2016-02-14 DIAGNOSIS — R296 Repeated falls: Secondary | ICD-10-CM | POA: Diagnosis not present

## 2016-02-14 DIAGNOSIS — R2689 Other abnormalities of gait and mobility: Secondary | ICD-10-CM | POA: Diagnosis not present

## 2016-02-14 DIAGNOSIS — M6281 Muscle weakness (generalized): Secondary | ICD-10-CM | POA: Diagnosis not present

## 2016-02-14 DIAGNOSIS — G8929 Other chronic pain: Secondary | ICD-10-CM | POA: Diagnosis not present

## 2016-02-14 DIAGNOSIS — M25562 Pain in left knee: Secondary | ICD-10-CM | POA: Diagnosis not present

## 2016-02-14 DIAGNOSIS — F418 Other specified anxiety disorders: Secondary | ICD-10-CM | POA: Diagnosis not present

## 2016-02-14 DIAGNOSIS — F329 Major depressive disorder, single episode, unspecified: Secondary | ICD-10-CM | POA: Diagnosis not present

## 2016-02-14 DIAGNOSIS — M25561 Pain in right knee: Secondary | ICD-10-CM | POA: Diagnosis not present

## 2016-02-16 DIAGNOSIS — H353212 Exudative age-related macular degeneration, right eye, with inactive choroidal neovascularization: Secondary | ICD-10-CM | POA: Diagnosis not present

## 2016-02-20 ENCOUNTER — Encounter: Payer: Self-pay | Admitting: Podiatry

## 2016-02-20 ENCOUNTER — Encounter: Payer: Self-pay | Admitting: Family Medicine

## 2016-02-20 ENCOUNTER — Ambulatory Visit (INDEPENDENT_AMBULATORY_CARE_PROVIDER_SITE_OTHER): Payer: Medicare Other | Admitting: Family Medicine

## 2016-02-20 VITALS — BP 112/60 | HR 76 | Temp 97.7°F | Resp 16 | Wt 214.0 lb

## 2016-02-20 DIAGNOSIS — R0609 Other forms of dyspnea: Secondary | ICD-10-CM | POA: Diagnosis not present

## 2016-02-20 DIAGNOSIS — R600 Localized edema: Secondary | ICD-10-CM | POA: Diagnosis not present

## 2016-02-20 DIAGNOSIS — Z23 Encounter for immunization: Secondary | ICD-10-CM | POA: Diagnosis not present

## 2016-02-20 DIAGNOSIS — R0600 Dyspnea, unspecified: Secondary | ICD-10-CM

## 2016-02-20 MED ORDER — FUROSEMIDE 20 MG PO TABS: 20.0000 mg | ORAL_TABLET | Freq: Every day | ORAL | 1 refills | Status: DC | PRN

## 2016-02-20 NOTE — Progress Notes (Signed)
Patient: Sherry Brooks Female    DOB: 1929/04/02   82 y.o.   MRN: [id number] Visit Date: 02/20/2016  Today's Provider: Mila Merryonald Romie Keeble, MD   Chief Complaint  Patient presents with  . Edema   Subjective:    HPI Lower extremity Edema: Patient comes in today stating she has swelling in both lower legs, ankles and feet. Patient has a history of edema, but reports she swelling has worsened in the past 3 days. Patient states she also has pain and tightness in both legs. Has had trouble with swelling for years but much worse the last few days. She has been on Aleve in the past, but has not taken this for a few months. Denies chest pain. Has been getting a little more short of breath on exertion. No injuries.     No Known Allergies   Current Outpatient Prescriptions:  .  ALPRAZolam (XANAX) 0.5 MG tablet, Take 0.5-1 tablets (0.25-0.5 mg total) by mouth 2 (two) times daily as needed for anxiety., Disp: 60 tablet, Rfl: 5 .  ARTIFICIAL TEAR SOLUTION OP, Apply to eye., Disp: , Rfl:  .  B Complex Vitamins (VITAMIN B COMPLEX PO), , Disp: , Rfl:  .  buPROPion (WELLBUTRIN) 75 MG tablet, Take 1 tablet (75 mg total) by mouth 2 (two) times daily., Disp: 180 tablet, Rfl: 1 .  calcium carbonate (TUMS EX) 750 MG chewable tablet, Chew 1 tablet by mouth daily as needed for heartburn., Disp: , Rfl:  .  Calcium Carbonate-Vitamin D (CALCIUM-VITAMIN D) 600-125 MG-UNIT TABS, Take by mouth., Disp: , Rfl:  .  lisinopril-hydrochlorothiazide (PRINZIDE,ZESTORETIC) 20-25 MG tablet, TAKE 1 TABLET BY MOUTH EVERY DAY., Disp: 90 tablet, Rfl: 1 .  Multiple Vitamins-Minerals (PRESERVISION AREDS) CAPS, Take by mouth., Disp: , Rfl:  .  naproxen sodium (ANAPROX) 220 MG tablet, Take 220 mg by mouth 2 (two) times daily with a meal., Disp: , Rfl:  .  Olopatadine HCl (PATADAY) 0.2 % SOLN, Apply to eye., Disp: , Rfl:  .  pravastatin (PRAVACHOL) 10 MG tablet, TAKE 1 TABLET BY MOUTH EVERY NIGHT AT BEDTIME., Disp: 90 tablet,  Rfl: 1 .  Psyllium (METAMUCIL PO), Take 1 capsule by mouth daily., Disp: , Rfl:  .  VITAMIN E PO, Take 1 tablet by mouth daily., Disp: , Rfl:   Review of Systems  Constitutional: Negative for appetite change, chills, fatigue and fever.  Respiratory: Positive for shortness of breath. Negative for cough and chest tightness.   Cardiovascular: Positive for leg swelling. Negative for chest pain and palpitations.  Gastrointestinal: Negative for abdominal pain, nausea and vomiting.  Musculoskeletal: Positive for back pain.  Neurological: Positive for light-headedness. Negative for dizziness, weakness and headaches.    Social History  Substance Use Topics  . Smoking status: Never Smoker  . Smokeless tobacco: Never Used  . Alcohol use Yes     Comment: glass of wine, rare occas.    Objective:   BP 112/60 (BP Location: Left Arm, Patient Position: Sitting, Cuff Size: Large)   Pulse 76   Temp 97.7 F (36.5 C) (Oral)   Resp 16   Wt 214 lb (97.1 kg)   SpO2 97% Comment: room air  BMI 36.73 kg/m   Physical Exam   General Appearance:    Alert, cooperative, no distress  Eyes:    PERRL, conjunctiva/corneas clear, EOM's intact       Lungs:     Clear to auscultation bilaterally, respirations unlabored  Heart:  Regular rate and rhythm 2+ bilateral foot, ankle and  Lower leg edema to mid calf. No erythema. No calf tenderness or cords.   Neurologic:   Awake, alert, oriented x 3. No apparent focal neurological           defect.           Assessment & Plan:     1. Dyspnea on exertion  - Comprehensive metabolic panel - CBC - Brain natriuretic peptide  2. Localized edema  - Comprehensive metabolic panel - T4 AND TSH - furosemide (LASIX) 20 MG tablet; Take 1 tablet (20 mg total) by mouth daily as needed for edema.  Dispense: 30 tablet; Refill: 1  3. Need for influenza vaccination  - Flu vaccine HIGH DOSE PF (Fluzone High dose)       Mila Merry, MD  [facility]

## 2016-02-21 ENCOUNTER — Ambulatory Visit (INDEPENDENT_AMBULATORY_CARE_PROVIDER_SITE_OTHER): Payer: Medicare Other | Admitting: Podiatry

## 2016-02-21 ENCOUNTER — Telehealth: Payer: Self-pay | Admitting: Family Medicine

## 2016-02-21 DIAGNOSIS — M79676 Pain in unspecified toe(s): Secondary | ICD-10-CM

## 2016-02-21 DIAGNOSIS — B351 Tinea unguium: Secondary | ICD-10-CM | POA: Diagnosis not present

## 2016-02-21 LAB — T4 AND TSH
T4, Total: 7.4 ug/dL (ref 4.5–12.0)
TSH: 3.62 u[IU]/mL (ref 0.450–4.500)

## 2016-02-21 LAB — COMPREHENSIVE METABOLIC PANEL
A/G RATIO: 1.5 (ref 1.2–2.2)
ALBUMIN: 4 g/dL (ref 3.5–4.7)
ALT: 17 IU/L (ref 0–32)
AST: 17 IU/L (ref 0–40)
Alkaline Phosphatase: 70 IU/L (ref 39–117)
BUN / CREAT RATIO: 19 (ref 12–28)
BUN: 24 mg/dL (ref 8–27)
CHLORIDE: 98 mmol/L (ref 96–106)
CO2: 28 mmol/L (ref 18–29)
Calcium: 10 mg/dL (ref 8.7–10.3)
Creatinine, Ser: 1.27 mg/dL — ABNORMAL HIGH (ref 0.57–1.00)
GFR, EST AFRICAN AMERICAN: 44 mL/min/{1.73_m2} — AB (ref >59)
GFR, EST NON AFRICAN AMERICAN: 39 mL/min/{1.73_m2} — AB (ref >59)
Globulin, Total: 2.6 g/dL (ref 1.5–4.5)
Glucose: 119 mg/dL — ABNORMAL HIGH (ref 65–99)
POTASSIUM: 5 mmol/L (ref 3.5–5.2)
Sodium: 139 mmol/L (ref 134–144)
TOTAL PROTEIN: 6.6 g/dL (ref 6.0–8.5)

## 2016-02-21 LAB — CBC
HEMATOCRIT: 40.2 % (ref 34.0–46.6)
Hemoglobin: 13.1 g/dL (ref 11.1–15.9)
MCH: 28.5 pg (ref 26.6–33.0)
MCHC: 32.6 g/dL (ref 31.5–35.7)
MCV: 88 fL (ref 79–97)
PLATELETS: 289 10*3/uL (ref 150–379)
RBC: 4.59 x10E6/uL (ref 3.77–5.28)
RDW: 14.6 % (ref 12.3–15.4)
WBC: 7.1 10*3/uL (ref 3.4–10.8)

## 2016-02-21 LAB — BRAIN NATRIURETIC PEPTIDE: BNP: 27.5 pg/mL (ref 0.0–100.0)

## 2016-02-21 NOTE — Progress Notes (Signed)
She presents today complaining of painful bilateral feet associated with her spinal surgery and her knee surgery. She is also complaining of painful elongated toenails that sharp incurvated nail margins.  Objective: Vital signs are stable she is alert and oriented 3. Pulses are palpable. Her toenails are thick yellow dystrophic with mycotic painful palpation as well as debridement.  Assessment: Limb secondary to onychomycosis 1 through 5 bilateral.  Plan: Debridement of toenails 1 through 5 bilateral covered service secondary to pain.

## 2016-03-07 NOTE — Telephone Encounter (Signed)
[facility] requesting a verbal order for pt to continue home health physical therapy 2 times a week for 5 weeks.  ZO#[phone]/WJCB#[phone]/MW

## 2016-03-07 NOTE — Telephone Encounter (Signed)
Advised Aldrin. Allene DillonEmily Drozdowski, CMA

## 2016-03-07 NOTE — Telephone Encounter (Signed)
Ok to order? Emily Drozdowski, CMA  

## 2016-03-07 NOTE — Telephone Encounter (Signed)
Yes please give verbal ok for PT continuation

## 2016-03-08 ENCOUNTER — Ambulatory Visit: Payer: Medicare Other

## 2016-03-08 ENCOUNTER — Other Ambulatory Visit: Payer: Self-pay | Admitting: Family Medicine

## 2016-03-08 DIAGNOSIS — E7800 Pure hypercholesterolemia, unspecified: Secondary | ICD-10-CM

## 2016-03-08 NOTE — Telephone Encounter (Signed)
Please review-aa 

## 2016-03-29 ENCOUNTER — Encounter: Payer: Medicare Other | Admitting: Family Medicine

## 2016-03-29 ENCOUNTER — Encounter: Payer: Medicare Other | Admitting: Physician Assistant

## 2016-04-01 ENCOUNTER — Other Ambulatory Visit: Payer: Self-pay | Admitting: Family Medicine

## 2016-04-01 DIAGNOSIS — I10 Essential (primary) hypertension: Secondary | ICD-10-CM

## 2016-04-05 ENCOUNTER — Ambulatory Visit: Payer: Medicare Other

## 2016-04-08 ENCOUNTER — Ambulatory Visit (INDEPENDENT_AMBULATORY_CARE_PROVIDER_SITE_OTHER): Payer: Medicare Other | Admitting: Physician Assistant

## 2016-04-08 ENCOUNTER — Encounter: Payer: Self-pay | Admitting: Physician Assistant

## 2016-04-08 VITALS — BP 124/62 | HR 62 | Temp 97.5°F | Resp 16

## 2016-04-08 DIAGNOSIS — N183 Chronic kidney disease, stage 3 unspecified: Secondary | ICD-10-CM

## 2016-04-08 DIAGNOSIS — E1122 Type 2 diabetes mellitus with diabetic chronic kidney disease: Secondary | ICD-10-CM | POA: Diagnosis not present

## 2016-04-08 DIAGNOSIS — R600 Localized edema: Secondary | ICD-10-CM

## 2016-04-08 DIAGNOSIS — Z0000 Encounter for general adult medical examination without abnormal findings: Secondary | ICD-10-CM

## 2016-04-08 DIAGNOSIS — E7800 Pure hypercholesterolemia, unspecified: Secondary | ICD-10-CM | POA: Diagnosis not present

## 2016-04-08 NOTE — Progress Notes (Signed)
Patient: Sherry Brooks, Female    DOB: [birth year], 82 y.o.   MRN: [id number] Visit Date: 04/08/2016  Today's Provider: Margaretann LovelessJennifer M Delcie Ruppert, PA-C   Chief Complaint  Patient presents with  . Medicare Wellness   Subjective:    Annual wellness visit Sherry Perchesngeborg L Harkin is a 82 y.o. female. She feels well. She reports exercising none. She reports she is sleeping well.  Colonoscopy:08/11/2007 BMD:03/15/2008 -----------------------------------------------------------   Review of Systems  Constitutional: Negative.   HENT: Negative.   Eyes: Negative.   Respiratory: Negative.   Cardiovascular: Positive for leg swelling.  Gastrointestinal: Negative.   Endocrine: Negative.   Genitourinary: Negative.   Musculoskeletal: Negative.   Skin: Negative.   Allergic/Immunologic: Negative.   Neurological: Positive for dizziness (at times; thinks is from the fluid pill).  Hematological: Negative.   Psychiatric/Behavioral: Negative.     Social History   Social History  . Marital status: Widowed    Spouse name: N/A  . Number of children: N/A  . Years of education: N/A   Occupational History  . Not on file.   Social History Main Topics  . Smoking status: Never Smoker  . Smokeless tobacco: Never Used  . Alcohol use Yes     Comment: glass of wine, rare occas.   . Drug use: No  . Sexual activity: No   Other Topics Concern  . Not on file   Social History Narrative  . No narrative on file    Past Medical History:  Diagnosis Date  . Anxiety   . Arthritis    knees  . Borderline diabetic   . Complication of anesthesia    wakes up slowly, takes longer than usual to wake  . Depression   . Diabetes (HCC)   . GERD (gastroesophageal reflux disease)    on occas. uses TUMS for indigestion   . Heart murmur   . History of stress test    10 yrs. ago, states it was normal , had been ordered for pt. due to stress related to husband's illness,, by Dr. Vic RipperNancy Malone  .  Hyperlipemia   . Hypertension   . Macular degeneration   . Peripheral vascular disease (HCC)    blood clots in legs mid 2000's   . Renal calculus    history of > 50 yrs. ago     Patient Active Problem List   Diagnosis Date Noted  . Abnormal finding on mammography 03/28/2015  . Adult BMI 30+ 03/28/2015  . Chronic kidney disease (CKD), stage III (moderate) 03/28/2015  . Chronic LBP 03/28/2015  . Can't get food down 03/28/2015  . Edema 03/28/2015  . Degeneration macular 03/28/2015  . Amnesia 03/28/2015  . Subclinical hypothyroidism 03/28/2015  . Hypercholesterolemia 03/28/2015  . Pain in the chest 02/14/2015  . SOB (shortness of breath) 02/14/2015  . Morbid obesity (HCC) 02/14/2015  . Unsteady gait 02/14/2015  . Primary osteoarthritis of both knees 02/14/2015  . Essential hypertension 02/14/2015  . Anxiety and depression 02/14/2015  . Joint pain 01/04/2014  . Spondylolisthesis 10/28/2012  . Arthritis, degenerative 08/25/2009  . Type 2 diabetes mellitus (HCC) 07/03/2007  . Colon, diverticulosis 02/12/2001  . OP (osteoporosis) 02/12/2001  . HLD (hyperlipidemia) 02/12/2001    Past Surgical History:  Procedure Laterality Date  . APPENDECTOMY     as a teenager   . BIOPSY THYROID  06/2008  . COLON SURGERY     due to diverticulitis- had colostomy, then takedown   . EYE  SURGERY     cataracts removed - /w IOL  . FRACTURE SURGERY     L wrist, plate in place  . JOINT REPLACEMENT     both knees   . SPINE SURGERY  10/23/2012   Spinal Fusion, lower back    Her family history includes Arthritis in her father and mother.     Current Meds  Medication Sig  . ALPRAZolam (XANAX) 0.5 MG tablet Take 0.5-1 tablets (0.25-0.5 mg total) by mouth 2 (two) times daily as needed for anxiety.  . ARTIFICIAL TEAR SOLUTION OP Apply to eye.  . B Complex Vitamins (VITAMIN B COMPLEX PO)   . buPROPion (WELLBUTRIN) 75 MG tablet Take 1 tablet (75 mg total) by mouth 2 (two) times daily.  . calcium  carbonate (TUMS EX) 750 MG chewable tablet Chew 1 tablet by mouth daily as needed for heartburn.  . Calcium Carbonate-Vitamin D (CALCIUM-VITAMIN D) 600-125 MG-UNIT TABS Take by mouth.  . furosemide (LASIX) 20 MG tablet Take 1 tablet (20 mg total) by mouth daily as needed for edema.  Marland Kitchen. lisinopril-hydrochlorothiazide (PRINZIDE,ZESTORETIC) 20-25 MG tablet TAKE 1 TABLET BY MOUTH EVERY DAY.  . Multiple Vitamins-Minerals (PRESERVISION AREDS) CAPS Take by mouth.  . naproxen sodium (ANAPROX) 220 MG tablet Take 220 mg by mouth 2 (two) times daily with a meal.  . Olopatadine HCl (PATADAY) 0.2 % SOLN Apply to eye.  . pravastatin (PRAVACHOL) 10 MG tablet TAKE 1 TABLET BY MOUTH EVERY NIGHT AT BEDTIME.  Marland Kitchen. Psyllium (METAMUCIL PO) Take 1 capsule by mouth daily.  Marland Kitchen. VITAMIN E PO Take 1 tablet by mouth daily.    Patient Care Team: Margaretann LovelessJennifer M Cayman Brogden, PA-C as PCP - General (Family Medicine)     Objective:   Vitals: BP 124/62 (BP Location: Left Arm, Patient Position: Sitting, Cuff Size: Large)   Pulse 62   Temp 97.5 F (36.4 C) (Oral)   Resp 16   SpO2 97%   Physical Exam  Constitutional: She is oriented to person, place, and time. She appears well-developed and well-nourished. No distress.  HENT:  Head: Normocephalic and atraumatic.  Right Ear: Tympanic membrane, external ear and ear canal normal.  Left Ear: Tympanic membrane, external ear and ear canal normal.  Nose: Nose normal.  Mouth/Throat: Uvula is midline, oropharynx is clear and moist and mucous membranes are normal. No oropharyngeal exudate.  Eyes: Conjunctivae and EOM are normal. Pupils are equal, round, and reactive to light. Right eye exhibits no discharge. Left eye exhibits no discharge. No scleral icterus.  Neck: Normal range of motion. Neck supple. No JVD present. Carotid bruit is not present. No tracheal deviation present. No thyromegaly present.  Cardiovascular: Normal rate, regular rhythm, normal heart sounds and intact distal  pulses.  Exam reveals no gallop and no friction rub.   No murmur heard. Pulmonary/Chest: Effort normal and breath sounds normal. No respiratory distress. She has no wheezes. She has no rales. She exhibits no tenderness.  Abdominal: Soft. Bowel sounds are normal. She exhibits no distension and no mass. There is no tenderness. There is no rebound and no guarding.  Musculoskeletal: Normal range of motion. She exhibits edema (3+ pitting edema in R LE up to mid calf just below knee; 2+ pitting in L LE to mid calf). She exhibits no tenderness.  Lymphadenopathy:    She has no cervical adenopathy.  Neurological: She is alert and oriented to person, place, and time.  Skin: Skin is warm and dry. No rash noted. She is not diaphoretic.  Psychiatric: She has a normal mood and affect. Her behavior is normal. Judgment and thought content normal.  Vitals reviewed.   Activities of Daily Living   Fall Risk Assessment Fall Risk  04/08/2016 12/13/2015 03/28/2015  Falls in the past year? No Yes No  Number falls in past yr: - 2 or more -  Injury with Fall? - (No Data) -     Depression Screen PHQ 2/9 Scores 04/08/2016 03/28/2015  PHQ - 2 Score 0 6  PHQ- 9 Score - 14    Cognitive Testing - 6-CIT  Correct? Score   What year is it? No 4 0 or 4  What month is it? yes 0 0 or 3  Memorize:    Floyde Parkins,  44,  [address]  [place],      What time is it? (within 1 hour) yes 0 0 or 3  Count backwards from 20 yes 0 0, 2, or 4  Name the months of the year yes 0 0, 2, or 4  Repeat name & address above no 8 0, 2, 4, 6, 8, or 10       TOTAL SCORE  12/28   Interpretation:  Abnormal- 12  Normal (0-7) Abnormal (8-28)   Audit-C Alcohol Use Screening  Question Answer Points  How often do you have alcoholic drink? never 0  On days you do drink alcohol, how many drinks do you typically consume? 0 0  How oftey will you drink 6 or more in a total? never 0  Total Score:  0   A score of 3 or more in women, and 4 or  more in men indicates increased risk for alcohol abuse, EXCEPT if all of the points are from question 1.     Assessment & Plan:     Annual Wellness Visit  Reviewed patient's Family Medical History Reviewed and updated list of patient's medical providers Assessment of cognitive impairment was done Assessed patient's functional ability Established a written schedule for health screening services Health Risk Assessent Completed and Reviewed  Exercise Activities and Dietary recommendations Goals    . Exercise 150 minutes per week (moderate activity)       Immunization History  Administered Date(s) Administered  . Influenza, High Dose Seasonal PF 03/28/2015, 02/20/2016  . Pneumococcal Conjugate-13 03/28/2015  . Pneumococcal Polysaccharide-23 07/21/2003  . Tdap 03/20/2011    Health Maintenance  Topic Date Due  . FOOT EXAM  10/11/1940  . OPHTHALMOLOGY EXAM  10/11/1940  . ZOSTAVAX  10/12/1990  . HEMOGLOBIN A1C  12/21/2015  . TETANUS/TDAP  03/19/2021  . INFLUENZA VACCINE  Completed  . DEXA SCAN  Completed  . PNA vac Low Risk Adult  Completed     Discussed health benefits of physical activity, and encouraged her to engage in regular exercise appropriate for her age and condition.   1. Medicare annual wellness visit, subsequent Normal physical exam with exception of lower extremity edema.  2. Hypercholesterolemia Will check labs as below and f/u pending results. - Lipid Profile  3. Chronic kidney disease (CKD), stage III (moderate) Previously elevated and thought to be from dehydration, also was started on furosemide for lower extremity edema. Will check labs as below and f/u pending results. - Comprehensive Metabolic Panel (CMET)  4. Type 2 diabetes mellitus with diabetic chronic kidney disease, unspecified CKD stage, unspecified long term insulin use status (HCC) Last check A1c was at goal at 6.2. Will check labs as below and f/u pending results. - HgB A1c  5. Lower  extremity edema Lower extremity edema worsening over the last 6 months. Compression stockings no longer fit due to size of legs. No ulcers. No heart failure. Most likely secondary to declining kidney function, more sedentary lifestyle and venous insuff. Will refer her to vein clinic for further evaluation and consideration for new compression stockings that will fit more appropriately.  - Ambulatory referral to Vascular Surgery  ------------------------------------------------------------------------------------------------------------    Margaretann Loveless, PA-C  [facility]

## 2016-04-08 NOTE — Patient Instructions (Signed)

## 2016-04-09 ENCOUNTER — Telehealth: Payer: Self-pay

## 2016-04-09 LAB — COMPREHENSIVE METABOLIC PANEL
ALT: 21 IU/L (ref 0–32)
AST: 20 IU/L (ref 0–40)
Albumin/Globulin Ratio: 1.5 (ref 1.2–2.2)
Albumin: 4.1 g/dL (ref 3.5–4.7)
Alkaline Phosphatase: 76 IU/L (ref 39–117)
BUN/Creatinine Ratio: 25 (ref 12–28)
BUN: 26 mg/dL (ref 8–27)
Bilirubin Total: 0.3 mg/dL (ref 0.0–1.2)
CHLORIDE: 100 mmol/L (ref 96–106)
CO2: 27 mmol/L (ref 18–29)
CREATININE: 1.04 mg/dL — AB (ref 0.57–1.00)
Calcium: 10.2 mg/dL (ref 8.7–10.3)
GFR calc Af Amer: 57 mL/min/{1.73_m2} — ABNORMAL LOW (ref >59)
GFR calc non Af Amer: 49 mL/min/{1.73_m2} — ABNORMAL LOW (ref >59)
GLUCOSE: 114 mg/dL — AB (ref 65–99)
Globulin, Total: 2.8 g/dL (ref 1.5–4.5)
Potassium: 4.5 mmol/L (ref 3.5–5.2)
Sodium: 142 mmol/L (ref 134–144)
Total Protein: 6.9 g/dL (ref 6.0–8.5)

## 2016-04-09 LAB — LIPID PANEL
CHOLESTEROL TOTAL: 214 mg/dL — AB (ref 100–199)
Chol/HDL Ratio: 2.7 ratio units (ref 0.0–4.4)
HDL: 79 mg/dL (ref >39)
LDL Calculated: 117 mg/dL — ABNORMAL HIGH (ref 0–99)
TRIGLYCERIDES: 90 mg/dL (ref 0–149)
VLDL Cholesterol Cal: 18 mg/dL (ref 5–40)

## 2016-04-09 LAB — HEMOGLOBIN A1C
ESTIMATED AVERAGE GLUCOSE: 128 mg/dL
HEMOGLOBIN A1C: 6.1 % — AB (ref 4.8–5.6)

## 2016-04-09 NOTE — Telephone Encounter (Signed)
[id number] 

## 2016-04-09 NOTE — Telephone Encounter (Signed)
Patients daughter in law has been advised. KW

## 2016-04-09 NOTE — Telephone Encounter (Signed)
-----   Message from Margaretann LovelessJennifer M Burnette, [place]PA-C sent at 04/09/2016  8:08 AM EST ----- Kidney function back to baseline. Potassium is WNL. Sugar is stable at 6.1. Cholesterol is stable. Please notify patient's daughter in law of results.

## 2016-04-22 ENCOUNTER — Ambulatory Visit (INDEPENDENT_AMBULATORY_CARE_PROVIDER_SITE_OTHER): Payer: Medicare Other | Admitting: Vascular Surgery

## 2016-04-22 ENCOUNTER — Encounter (INDEPENDENT_AMBULATORY_CARE_PROVIDER_SITE_OTHER): Payer: Self-pay | Admitting: Vascular Surgery

## 2016-04-22 VITALS — BP 153/78 | HR 76 | Resp 17 | Ht 64.0 in | Wt 211.0 lb

## 2016-04-22 DIAGNOSIS — M79604 Pain in right leg: Secondary | ICD-10-CM | POA: Diagnosis not present

## 2016-04-22 DIAGNOSIS — N183 Chronic kidney disease, stage 3 unspecified: Secondary | ICD-10-CM

## 2016-04-22 DIAGNOSIS — M8949 Other hypertrophic osteoarthropathy, multiple sites: Secondary | ICD-10-CM

## 2016-04-22 DIAGNOSIS — I872 Venous insufficiency (chronic) (peripheral): Secondary | ICD-10-CM | POA: Diagnosis not present

## 2016-04-22 DIAGNOSIS — M150 Primary generalized (osteo)arthritis: Secondary | ICD-10-CM | POA: Diagnosis not present

## 2016-04-22 DIAGNOSIS — M79605 Pain in left leg: Secondary | ICD-10-CM

## 2016-04-22 DIAGNOSIS — I890 Lymphedema, not elsewhere classified: Secondary | ICD-10-CM

## 2016-04-22 DIAGNOSIS — M159 Polyosteoarthritis, unspecified: Secondary | ICD-10-CM

## 2016-04-22 DIAGNOSIS — M79609 Pain in unspecified limb: Secondary | ICD-10-CM | POA: Insufficient documentation

## 2016-04-22 NOTE — Progress Notes (Signed)
MRN : [id number]  Sherry Brooks is a 82 y.o. (12-23-1928) female who presents with chief complaint of  Chief Complaint  Patient presents with  . New Evaluation    Left leg swelling  .  History of Present Illness: Patient is seen for evaluation of leg swelling.  She notes the left leg is worse than the right leg.  The patient first noticed the swelling remotely but is now concerned because of a significant increase in the overall edema. The swelling is associated with pain and discoloration. The patient notes that in the morning the legs are significantly improved but they steadily worsened throughout the course of the day. Elevation makes the legs better, dependency makes them much worse.   There is no history of ulcerations associated with the swelling.   The patient denies any recent changes in their medications.  The patient has not been wearing graduated compression.  The patient has no had any past angiography, interventions or vascular surgery.  The patient denies a history of DVT or PE. There is no prior history of phlebitis. There is no history of primary lymphedema.  There is no history of radiation treatment to the groin or pelvis No history of malignancies. No history of trauma or groin or pelvic surgery. No history of foreign travel or parasitic infections area    Current Meds  Medication Sig  . ALPRAZolam (XANAX) 0.5 MG tablet Take 0.5-1 tablets (0.25-0.5 mg total) by mouth 2 (two) times daily as needed for anxiety.  . ARTIFICIAL TEAR SOLUTION OP Apply to eye.  . B Complex Vitamins (VITAMIN B COMPLEX PO)   . buPROPion (WELLBUTRIN) 75 MG tablet Take 1 tablet (75 mg total) by mouth 2 (two) times daily.  . calcium carbonate (TUMS EX) 750 MG chewable tablet Chew 1 tablet by mouth daily as needed for heartburn.  . Calcium Carbonate-Vitamin D (CALCIUM-VITAMIN D) 600-125 MG-UNIT TABS Take by mouth.  . furosemide (LASIX) 20 MG tablet Take 1 tablet (20 mg total) by  mouth daily as needed for edema.  Marland Kitchen lisinopril-hydrochlorothiazide (PRINZIDE,ZESTORETIC) 20-25 MG tablet TAKE 1 TABLET BY MOUTH EVERY DAY.  . Multiple Vitamins-Minerals (PRESERVISION AREDS) CAPS Take by mouth.  . naproxen sodium (ANAPROX) 220 MG tablet Take 220 mg by mouth 2 (two) times daily with a meal.  . Olopatadine HCl (PATADAY) 0.2 % SOLN Apply to eye.  . pravastatin (PRAVACHOL) 10 MG tablet TAKE 1 TABLET BY MOUTH EVERY NIGHT AT BEDTIME.  Marland Kitchen Psyllium (METAMUCIL PO) Take 1 capsule by mouth daily.  Marland Kitchen VITAMIN E PO Take 1 tablet by mouth daily.    Past Medical History:  Diagnosis Date  . Anxiety   . Arthritis    knees  . Borderline diabetic   . Complication of anesthesia    wakes up slowly, takes longer than usual to wake  . Depression   . Diabetes (HCC)   . GERD (gastroesophageal reflux disease)    on occas. uses TUMS for indigestion   . Heart murmur   . History of stress test    10 yrs. ago, states it was normal , had been ordered for pt. due to stress related to husband's illness,, by Dr. Vic Ripper  . Hyperlipemia   . Hypertension   . Macular degeneration   . Peripheral vascular disease (HCC)    blood clots in legs mid 2000's   . Renal calculus    history of > 50 yrs. ago    Past Surgical History:  Procedure Laterality Date  . APPENDECTOMY     as a teenager   . BIOPSY THYROID  06/2008  . COLON SURGERY     due to diverticulitis- had colostomy, then takedown   . EYE SURGERY     cataracts removed - /w IOL  . FRACTURE SURGERY     L wrist, plate in place  . JOINT REPLACEMENT     both knees   . SPINE SURGERY  10/23/2012   Spinal Fusion, lower back    Social History Social History  Substance Use Topics  . Smoking status: Never Smoker  . Smokeless tobacco: Never Used  . Alcohol use Yes     Comment: glass of wine, rare occas.     Family History Family History  Problem Relation Age of Onset  . Arthritis Mother   . Arthritis Father   . Bladder Cancer Neg Hx    . Kidney cancer Neg Hx   . Prostate cancer Neg Hx   No family history of bleeding/clotting disorders, porphyria or autoimmune disease   No Known Allergies   REVIEW OF SYSTEMS (Negative unless checked)  Constitutional: [] Weight loss  [] Fever  [] Chills Cardiac: [] Chest pain   [] Chest pressure   [] Palpitations   [] Shortness of breath when laying flat   [] Shortness of breath with exertion. Vascular:  [x] Pain in legs with walking   [] Pain in legs at rest  [] History of DVT   [] Phlebitis   [x] Swelling in legs   [x] Varicose veins   [] Non-healing ulcers Pulmonary:   [] Uses home oxygen   [] Productive cough   [] Hemoptysis   [] Wheeze  [] COPD   [] Asthma Neurologic:  [] Dizziness   [] Seizures   [] History of stroke   [] History of TIA  [] Aphasia   [] Vissual changes   [] Weakness or numbness in arm   [] Weakness or numbness in leg Musculoskeletal:   [] Joint swelling   [x] Joint pain   [] Low back pain Hematologic:  [] Easy bruising  [] Easy bleeding   [] Hypercoagulable state   [] Anemic Gastrointestinal:  [] Diarrhea   [] Vomiting  [] Gastroesophageal reflux/heartburn   [] Difficulty swallowing. Genitourinary:  [] Chronic kidney disease   [] Difficult urination  [] Frequent urination   [] Blood in urine Skin:  [] Rashes   [] Ulcers  Psychological:  [] History of anxiety   []  History of major depression.  Physical Examination  Vitals:   04/22/16 0909  BP: (!) 153/78  Pulse: 76  Resp: 17  Weight: 211 lb (95.7 kg)  Height: 5\' 4"  (1.626 m)   Body mass index is 36.22 kg/m. Gen: WD/WN, NAD Head: /AT, No temporalis wasting.  Ear/Nose/Throat: Hearing grossly intact, nares w/o erythema or drainage, poor dentition Eyes: PER, EOMI, sclera nonicteric.  Neck: Supple, no masses.  No bruit or JVD.  Pulmonary:  Good air movement, clear to auscultation bilaterally, no use of accessory muscles.  Cardiac: RRR, normal S1, S2, no Murmurs. Vascular: 2-3+ edema brawny venous changes of the left leg with redness and thickening of  the skin Vessel Right Left  Radial Palpable Palpable  Ulnar Palpable Palpable  Brachial Palpable Palpable  Carotid Palpable Palpable  Femoral Palpable Palpable  Popliteal Palpable Palpable  PT 1+ Palpable 1+ Palpable  DP 1+ Palpable 1+ Palpable   Gastrointestinal: soft, non-distended. No guarding/no peritoneal signs.  Musculoskeletal: M/S 5/5 throughout.  No deformity or atrophy.  Neurologic: CN 2-12 intact. Pain and light touch intact in extremities.  Symmetrical.  Speech is fluent. Motor exam as listed above. Psychiatric: Judgment intact, Mood & affect appropriate for pt's clinical  situation. Dermatologic: No rashes or ulcers noted.  No changes consistent with cellulitis. Lymph : No Cervical lymphadenopathy, no lichenification or skin changes of chronic lymphedema.  CBC Lab Results  Component Value Date   WBC 7.1 02/20/2016   HGB 13.4 02/10/2015   HCT 40.2 02/20/2016   MCV 88 02/20/2016   PLT 289 02/20/2016    BMET    Component Value Date/Time   NA 142 04/08/2016 1117   K 4.5 04/08/2016 1117   CL 100 04/08/2016 1117   CO2 27 04/08/2016 1117   GLUCOSE 114 (H) 04/08/2016 1117   GLUCOSE 100 (H) 02/10/2015 1524   BUN 26 04/08/2016 1117   CREATININE 1.04 (H) 04/08/2016 1117   CALCIUM 10.2 04/08/2016 1117   GFRNONAA 49 (L) 04/08/2016 1117   GFRAA 57 (L) 04/08/2016 1117   Estimated Creatinine Clearance: 44.4 mL/min (by C-G formula based on SCr of 1.04 mg/dL (H)).  COAG No results found for: INR, PROTIME  Radiology No results found.  Assessment/Plan 1. Lymphedema I have had a long discussion with the patient regarding swelling and why it  causes symptoms.  Patient will begin wearing graduated compression stockings class 1 (20-30 mmHg) on a daily basis a prescription was given. The patient will  beginning wearing the stockings first thing in the morning and removing them in the evening. The patient is instructed specifically not to sleep in the stockings.   In  addition, behavioral modification will be initiated.  This will include frequent elevation, use of over the counter pain medications and exercise such as walking.  I have reviewed systemic causes for chronic edema such as liver, kidney and cardiac etiologies.  The patient denies problems with these organ systems.    Consideration for a lymph pump will also be made based upon the effectiveness of conservative therapy.  This would help to improve the edema control and prevent sequela such as ulcers and infections   Patient should undergo duplex ultrasound of the venous system to ensure that DVT or reflux is not present.  The patient will follow-up with me after the ultrasound.    2. Chronic venous insufficiency See #1 - VAS US LOWER EXTREMITY VENOUS (DVT); Future  3. Pain in both lower extremities See #1  - VAS US LOWER EXTREMITY VENOUS (DVT); Future  4. Primary osteoarthritis involving multiple joints Continue NSAID therapy as directed but must be prudent given the renal disease  5. Chronic kidney disease (CKD), stage III (moderate) Avoid dehydration and nephrotoxic medications  Levora DredgeGregory Jericho Cieslik, MD  04/22/2016 9:56 AM

## 2016-04-23 ENCOUNTER — Encounter (INDEPENDENT_AMBULATORY_CARE_PROVIDER_SITE_OTHER): Payer: Self-pay | Admitting: Vascular Surgery

## 2016-04-23 ENCOUNTER — Ambulatory Visit (INDEPENDENT_AMBULATORY_CARE_PROVIDER_SITE_OTHER): Payer: Medicare Other

## 2016-04-23 ENCOUNTER — Ambulatory Visit (INDEPENDENT_AMBULATORY_CARE_PROVIDER_SITE_OTHER): Payer: Medicare Other | Admitting: Vascular Surgery

## 2016-04-23 VITALS — BP 190/95 | HR 67 | Resp 16 | Ht 64.0 in | Wt 211.0 lb

## 2016-04-23 DIAGNOSIS — M79604 Pain in right leg: Secondary | ICD-10-CM | POA: Diagnosis not present

## 2016-04-23 DIAGNOSIS — M79605 Pain in left leg: Secondary | ICD-10-CM | POA: Diagnosis not present

## 2016-04-23 DIAGNOSIS — I890 Lymphedema, not elsewhere classified: Secondary | ICD-10-CM

## 2016-04-23 DIAGNOSIS — I872 Venous insufficiency (chronic) (peripheral): Secondary | ICD-10-CM

## 2016-04-23 DIAGNOSIS — E1122 Type 2 diabetes mellitus with diabetic chronic kidney disease: Secondary | ICD-10-CM

## 2016-04-23 NOTE — Progress Notes (Signed)
Subjective:    Patient ID: Sherry Brooks, female    DOB: November 03, 1928, 82 y.o.   MRN: [id number] Chief Complaint  Patient presents with  . Re-evaluation    Ultrasound follow up   Patient last seen on 04/22/16 for evaluation of lower extremity swelling.  She notes the left leg is worse than the right leg. The patient first noticed the swelling remotely but is now concerned because of a significant increase in the overall edema. The swelling is associated with pain and discoloration. The patient notes that in the morning the legs are significantly improved but they steadily worsened throughout the course of the day. Elevation makes the legs better, dependency makes them much worse. Today, she underwent a bilateral venous duplex which was notable for RIGHT: GSV stripped, reflux in small branch in calf, no DVT and no SVT /  LEFT: No Reflux, No DVT, No SVT   Review of Systems  Constitutional: Negative.   HENT: Negative.   Eyes: Negative.   Respiratory: Negative.   Cardiovascular: Positive for leg swelling.  Gastrointestinal: Negative.   Endocrine: Negative.   Genitourinary: Negative.   Musculoskeletal: Negative.   Skin: Negative.   Allergic/Immunologic: Negative.   Neurological: Negative.   Hematological: Negative.   Psychiatric/Behavioral: Negative.       Objective:   Physical Exam   Gen: WD/WN, NAD Head: [place]/AT, No temporalis wasting.  Ear/Nose/Throat: Hearing grossly intact, nares w/o erythema or drainage, poor dentition Eyes: PER, EOMI, sclera nonicteric.  Neck: Supple, no masses.  No bruit or JVD.  Pulmonary:  Good air movement, clear to auscultation bilaterally, no use of accessory muscles.  Cardiac: RRR, normal S1, S2, no Murmurs. Vascular: 2-3+ edema brawny venous changes of the left leg with redness and thickening of the skin Vessel Right Left  Radial Palpable Palpable  Ulnar Palpable Palpable  Brachial Palpable Palpable  Carotid Palpable Palpable  Femoral  Palpable Palpable  Popliteal Palpable Palpable  PT 1+ Palpable 1+ Palpable  DP 1+ Palpable 1+ Palpable   Gastrointestinal: soft, non-distended. No guarding/no peritoneal signs.  Musculoskeletal: M/S 5/5 throughout.  No deformity or atrophy.  Neurologic: CN 2-12 intact. Pain and light touch intact in extremities.  Symmetrical.  Speech is fluent. Motor exam as listed above. Psychiatric: Judgment intact, Mood & affect appropriate for pt's clinical situation. Dermatologic: No rashes or ulcers noted.  No changes consistent with cellulitis. Lymph : No Cervical lymphadenopathy, no lichenification or skin changes of chronic lymphedema.  BP (!) 190/95 (BP Location: Right Arm)   Pulse 67   Resp 16   Ht 5\' 4"  (1.626 m)   Wt 211 lb (95.7 kg)   BMI 36.22 kg/m   Past Medical History:  Diagnosis Date  . Anxiety   . Arthritis    knees  . Borderline diabetic   . Complication of anesthesia    wakes up slowly, takes longer than usual to wake  . Depression   . Diabetes (HCC)   . GERD (gastroesophageal reflux disease)    on occas. uses TUMS for indigestion   . Heart murmur   . History of stress test    10 yrs. ago, states it was normal , had been ordered for pt. due to stress related to husband's illness,, by Dr. Vic RipperNancy Malone  . Hyperlipemia   . Hypertension   . Macular degeneration   . Peripheral vascular disease (HCC)    blood clots in legs mid 2000's   . Renal calculus    history  of > 50 yrs. ago   Social History   Social History  . Marital status: Widowed    Spouse name: N/A  . Number of children: N/A  . Years of education: N/A   Occupational History  . Not on file.   Social History Main Topics  . Smoking status: Never Smoker  . Smokeless tobacco: Never Used  . Alcohol use Yes     Comment: glass of wine, rare occas.   . Drug use: No  . Sexual activity: No   Other Topics Concern  . Not on file   Social History Narrative  . No narrative on file   Past Surgical  History:  Procedure Laterality Date  . APPENDECTOMY     as a teenager   . BIOPSY THYROID  06/2008  . COLON SURGERY     due to diverticulitis- had colostomy, then takedown   . EYE SURGERY     cataracts removed - /w IOL  . FRACTURE SURGERY     L wrist, plate in place  . JOINT REPLACEMENT     both knees   . SPINE SURGERY  10/23/2012   Spinal Fusion, lower back   Family History  Problem Relation Age of Onset  . Arthritis Mother   . Arthritis Father   . Bladder Cancer Neg Hx   . Kidney cancer Neg Hx   . Prostate cancer Neg Hx    No Known Allergies     Assessment & Plan:  Patient last seen on 04/22/16 for evaluation of lower extremity swelling.  She notes the left leg is worse than the right leg. The patient first noticed the swelling remotely but is now concerned because of a significant increase in the overall edema. The swelling is associated with pain and discoloration. The patient notes that in the morning the legs are significantly improved but they steadily worsened throughout the course of the day. Elevation makes the legs better, dependency makes them much worse. Today, she underwent a bilateral venous duplex which was notable for RIGHT: GSV stripped, reflux in small branch in calf, no DVT and no SVT /  LEFT: No Reflux, No DVT, No SVT  1. Lymphedema - New Studies reviewed with the patient. I spent time discussing her duplex results, the difference between venous insufficiency vs lymphedema and why her swelling is not venous in origin. I discussed lymphedema and what symptoms it causes and how to manage them. The patient was encouraged to wear graduated compression stockings (20-30 mmHg) on a daily basis. The patient was instructed to begin wearing the stockings first thing in the morning and removing them in the evening. The patient was instructed specifically not to sleep in the stockings. Prescription given. In addition, behavioral modification including elevation during the  day will be initiated. We discussed a lymphedema pump if conventional therapy goes not work. The patient was advised to follow up in one month after wearing her compression stockings daily with elevation. Information on compression stockings, lymphedema and the lymphedema pump was given to the patient. The patient was instructed to call the office in the interim if any worsening edema or ulcerations to the legs, feet or toes occurs. The patient expresses their understanding.  2. Chronic venous insufficiency - Stable Left GSV stripping in 1960's  3. Type 2 diabetes mellitus with diabetic chronic kidney disease, unspecified CKD stage, unspecified long term insulin use status (HCC) - Stable Encouraged good control as its slows the progression of atherosclerotic disease  Current  Outpatient Prescriptions on File Prior to Visit  Medication Sig Dispense Refill  . ALPRAZolam (XANAX) 0.5 MG tablet Take 0.5-1 tablets (0.25-0.5 mg total) by mouth 2 (two) times daily as needed for anxiety. 60 tablet 5  . ARTIFICIAL TEAR SOLUTION OP Apply to eye.    . B Complex Vitamins (VITAMIN B COMPLEX PO)     . buPROPion (WELLBUTRIN) 75 MG tablet Take 1 tablet (75 mg total) by mouth 2 (two) times daily. 180 tablet 1  . calcium carbonate (TUMS EX) 750 MG chewable tablet Chew 1 tablet by mouth daily as needed for heartburn.    . Calcium Carbonate-Vitamin D (CALCIUM-VITAMIN D) 600-125 MG-UNIT TABS Take by mouth.    . furosemide (LASIX) 20 MG tablet Take 1 tablet (20 mg total) by mouth daily as needed for edema. 30 tablet 1  . lisinopril-hydrochlorothiazide (PRINZIDE,ZESTORETIC) 20-25 MG tablet TAKE 1 TABLET BY MOUTH EVERY DAY. 90 tablet 3  . Multiple Vitamins-Minerals (PRESERVISION AREDS) CAPS Take by mouth.    . naproxen sodium (ANAPROX) 220 MG tablet Take 220 mg by mouth 2 (two) times daily with a meal.    . Olopatadine HCl (PATADAY) 0.2 % SOLN Apply to eye.    . pravastatin (PRAVACHOL) 10 MG tablet TAKE 1 TABLET BY  MOUTH EVERY NIGHT AT BEDTIME. 90 tablet 1  . Psyllium (METAMUCIL PO) Take 1 capsule by mouth daily.    Marland Kitchen. VITAMIN E PO Take 1 tablet by mouth daily.     No current facility-administered medications on file prior to visit.     There are no Patient Instructions on file for this visit. Return in about 1 month (around 05/23/2016).   Ade Stmarie A Laterrance Nauta, PA-C

## 2016-05-14 ENCOUNTER — Other Ambulatory Visit: Payer: Self-pay | Admitting: Family Medicine

## 2016-05-14 DIAGNOSIS — R600 Localized edema: Secondary | ICD-10-CM

## 2016-05-22 ENCOUNTER — Ambulatory Visit (INDEPENDENT_AMBULATORY_CARE_PROVIDER_SITE_OTHER): Payer: Medicare Other | Admitting: Podiatry

## 2016-05-22 DIAGNOSIS — B351 Tinea unguium: Secondary | ICD-10-CM

## 2016-05-22 DIAGNOSIS — M79676 Pain in unspecified toe(s): Secondary | ICD-10-CM | POA: Diagnosis not present

## 2016-05-22 NOTE — Progress Notes (Signed)
She presents today with a chief complaint painful elongated toenails.  Objective: Vital signs are stable she is alert and oriented 3. Pulses are palpable. Her toenails are thick yellow dystrophic onychomycotic. Considerable edema bilateral lower extremity pitting in nature.  Assessment: Pain limb sigmoid onychomycosis and edema.  Plan: Debridement of toenails 1 through 5 bilateral. Follow up with her in 3 months.

## 2016-05-23 DIAGNOSIS — Z85828 Personal history of other malignant neoplasm of skin: Secondary | ICD-10-CM | POA: Diagnosis not present

## 2016-05-23 DIAGNOSIS — D485 Neoplasm of uncertain behavior of skin: Secondary | ICD-10-CM | POA: Diagnosis not present

## 2016-05-23 DIAGNOSIS — Z08 Encounter for follow-up examination after completed treatment for malignant neoplasm: Secondary | ICD-10-CM | POA: Diagnosis not present

## 2016-06-06 ENCOUNTER — Ambulatory Visit (INDEPENDENT_AMBULATORY_CARE_PROVIDER_SITE_OTHER): Payer: Medicare Other | Admitting: Vascular Surgery

## 2016-06-10 ENCOUNTER — Encounter (INDEPENDENT_AMBULATORY_CARE_PROVIDER_SITE_OTHER): Payer: Self-pay | Admitting: Vascular Surgery

## 2016-06-10 ENCOUNTER — Ambulatory Visit (INDEPENDENT_AMBULATORY_CARE_PROVIDER_SITE_OTHER): Payer: Medicare Other | Admitting: Vascular Surgery

## 2016-06-10 VITALS — BP 177/81 | HR 76 | Resp 16 | Wt 217.6 lb

## 2016-06-10 DIAGNOSIS — M79605 Pain in left leg: Secondary | ICD-10-CM

## 2016-06-10 DIAGNOSIS — M79604 Pain in right leg: Secondary | ICD-10-CM

## 2016-06-10 DIAGNOSIS — I890 Lymphedema, not elsewhere classified: Secondary | ICD-10-CM | POA: Diagnosis not present

## 2016-06-10 DIAGNOSIS — E7800 Pure hypercholesterolemia, unspecified: Secondary | ICD-10-CM

## 2016-06-11 NOTE — Progress Notes (Signed)
Subjective:    Patient ID: Sherry Brooks, female    DOB: 06-05-1928, 83 y.o.   MRN: [id number] Chief Complaint  Patient presents with  . Follow-up   HPI Patient last seen on 04/23/16 to review studies. At that time, the patient was diagnosed with stage two lymphedema. Patient was originally seen on 04/22/16 for evaluation of lower extremity swelling.  She notes the left leg is worse than the right leg. The patient first noticed the swelling remotely but is now concerned because of a significant increase in the overall edema. The swelling is associated with pain and discoloration. The patient notes that in the morning the legs are significantly improved but they steadily worsened throughout the course of the day. She underwent a bilateral venous duplex which was notable for RIGHT: GSV stripped, reflux in small branch in calf, no DVT and no SVT /  LEFT: No DVT / SVT or reflux. Over the last two months despite conservative treatment, including exercise, elevation and class one compression stockings the patient still presents with stage two lymphedema.   Review of Systems Constitutional: Negative.   HENT: Negative.   Eyes: Negative.   Respiratory: Negative.   Cardiovascular: Positive for leg swelling.  Gastrointestinal: Negative.   Endocrine: Negative.   Genitourinary: Negative.   Musculoskeletal: Negative.   Skin: Negative.   Allergic/Immunologic: Negative.   Neurological: Negative.   Hematological: Negative.   Psychiatric/Behavioral: Negative.     Objective:   Physical Exam Gen: WD/WN, NAD Head: [place]/AT, No temporalis wasting.  Ear/Nose/Throat: Hearing grossly intact, nares w/o erythema or drainage, poor dentition Eyes: PER, EOMI, sclera nonicteric.  Neck: Supple, no masses. No bruit or JVD.  Pulmonary: Good air movement, clear to auscultation bilaterally, no use of accessory muscles.  Cardiac: RRR, normal S1, S2, no Murmurs. Vascular: 2-3+ edema brawny venous changes of the  left leg with redness and thickening of the skin Vessel Right Left  Radial Palpable Palpable  Ulnar Palpable Palpable  Brachial Palpable Palpable  Carotid Palpable Palpable  Femoral Palpable Palpable  Popliteal Palpable Palpable  PT 1+ Palpable 1+ Palpable  DP 1+ Palpable 1+ Palpable   Gastrointestinal: soft, non-distended. No guarding/no peritoneal signs.  Musculoskeletal: M/S 5/5 throughout. No deformity or atrophy.  Neurologic: CN 2-12 intact. Pain and light touch intact in extremities. Symmetrical. Speech is fluent. Motor exam as listed above. Psychiatric: Judgment intact, Mood & affect appropriate for pt's clinical situation. Dermatologic: No rashes or ulcers noted. No changes consistent with cellulitis. Lymph : No Cervical lymphadenopathy, no lichenification or skin changes of chronic lymphedema.  BP (!) 177/81   Pulse 76   Resp 16   Wt 217 lb 9.6 oz (98.7 kg)   BMI 37.35 kg/m   Past Medical History:  Diagnosis Date  . Anxiety   . Arthritis    knees  . Borderline diabetic   . Complication of anesthesia    wakes up slowly, takes longer than usual to wake  . Depression   . Diabetes (HCC)   . GERD (gastroesophageal reflux disease)    on occas. uses TUMS for indigestion   . Heart murmur   . History of stress test    10 yrs. ago, states it was normal , had been ordered for pt. due to stress related to husband's illness,, by Dr. Vic Ripper  . Hyperlipemia   . Hypertension   . Macular degeneration   . Peripheral vascular disease (HCC)    blood clots in legs mid 2000's   .  Renal calculus    history of > 50 yrs. ago    Social History   Social History  . Marital status: Widowed    Spouse name: N/A  . Number of children: N/A  . Years of education: N/A   Occupational History  . Not on file.   Social History Main Topics  . Smoking status: Never Smoker  . Smokeless tobacco: Never Used  . Alcohol use Yes     Comment: glass of wine, rare occas.   . Drug  use: No  . Sexual activity: No   Other Topics Concern  . Not on file   Social History Narrative  . No narrative on file    Past Surgical History:  Procedure Laterality Date  . APPENDECTOMY     as a teenager   . BIOPSY THYROID  06/2008  . COLON SURGERY     due to diverticulitis- had colostomy, then takedown   . EYE SURGERY     cataracts removed - /w IOL  . FRACTURE SURGERY     L wrist, plate in place  . JOINT REPLACEMENT     both knees   . SPINE SURGERY  10/23/2012   Spinal Fusion, lower back    Family History  Problem Relation Age of Onset  . Arthritis Mother   . Arthritis Father   . Bladder Cancer Neg Hx   . Kidney cancer Neg Hx   . Prostate cancer Neg Hx     No Known Allergies     Assessment & Plan:  Patient last seen on 04/23/16 to review studies. At that time, the patient was diagnosed with stage two lymphedema. Patient was originally seen on 04/22/16 for evaluation of lower extremity swelling.  She notes the left leg is worse than the right leg. The patient first noticed the swelling remotely but is now concerned because of a significant increase in the overall edema. The swelling is associated with pain and discoloration. The patient notes that in the morning the legs are significantly improved but they steadily worsened throughout the course of the day. She underwent a bilateral venous duplex which was notable for RIGHT: GSV stripped, reflux in small branch in calf, no DVT and no SVT /  LEFT: No DVT / SVT or reflux. Over the last two months despite conservative treatment, including exercise, elevation and class one compression stockings the patient still presents with stage two lymphedema.   1. Lymphedema - Worsening Over the last two months despite conservative treatment, including exercise, elevation and class one compression stockings the patient still presents with stage two lymphedema. Patient would benefit from additional therapy from a lymphedema pump.  2.  Pain in both lower extremities - Stable Likely factor uncontrolled swelling in lower extremity Conservative therapy has failed. Addition of lymphedema pump will greatly help patient.   3. Hypercholesterolemia - Stable Encouraged good control as its slows the progression of atherosclerotic disease  Current Outpatient Prescriptions on File Prior to Visit  Medication Sig Dispense Refill  . ALPRAZolam (XANAX) 0.5 MG tablet Take 0.5-1 tablets (0.25-0.5 mg total) by mouth 2 (two) times daily as needed for anxiety. 60 tablet 5  . ARTIFICIAL TEAR SOLUTION OP Apply to eye.    . B Complex Vitamins (VITAMIN B COMPLEX PO)     . buPROPion (WELLBUTRIN) 75 MG tablet Take 1 tablet (75 mg total) by mouth 2 (two) times daily. 180 tablet 1  . calcium carbonate (TUMS EX) 750 MG chewable tablet Chew 1 tablet  by mouth daily as needed for heartburn.    . Calcium Carbonate-Vitamin D (CALCIUM-VITAMIN D) 600-125 MG-UNIT TABS Take by mouth.    . furosemide (LASIX) 20 MG tablet TAKE 1 TABLET(20 MG) BY MOUTH DAILY AS NEEDED FOR SWELLING 30 tablet 1  . lisinopril-hydrochlorothiazide (PRINZIDE,ZESTORETIC) 20-25 MG tablet TAKE 1 TABLET BY MOUTH EVERY DAY. 90 tablet 3  . Multiple Vitamins-Minerals (PRESERVISION AREDS) CAPS Take by mouth.    . naproxen sodium (ANAPROX) 220 MG tablet Take 220 mg by mouth 2 (two) times daily with a meal.    . Olopatadine HCl (PATADAY) 0.2 % SOLN Apply to eye.    . pravastatin (PRAVACHOL) 10 MG tablet TAKE 1 TABLET BY MOUTH EVERY NIGHT AT BEDTIME. 90 tablet 1  . Psyllium (METAMUCIL PO) Take 1 capsule by mouth daily.    Marland Kitchen VITAMIN E PO Take 1 tablet by mouth daily.     No current facility-administered medications on file prior to visit.     There are no Patient Instructions on file for this visit. No Follow-up on file.   Froylan Hobby A Khalik Pewitt, PA-C

## 2016-06-28 DIAGNOSIS — Z961 Presence of intraocular lens: Secondary | ICD-10-CM | POA: Diagnosis not present

## 2016-07-09 ENCOUNTER — Encounter: Payer: Self-pay | Admitting: Physician Assistant

## 2016-07-09 ENCOUNTER — Ambulatory Visit (INDEPENDENT_AMBULATORY_CARE_PROVIDER_SITE_OTHER): Payer: Medicare Other | Admitting: Physician Assistant

## 2016-07-09 VITALS — BP 132/70 | HR 65 | Temp 97.7°F | Resp 16 | Wt 216.6 lb

## 2016-07-09 DIAGNOSIS — I10 Essential (primary) hypertension: Secondary | ICD-10-CM

## 2016-07-09 DIAGNOSIS — F339 Major depressive disorder, recurrent, unspecified: Secondary | ICD-10-CM

## 2016-07-09 DIAGNOSIS — E1122 Type 2 diabetes mellitus with diabetic chronic kidney disease: Secondary | ICD-10-CM

## 2016-07-09 DIAGNOSIS — R443 Hallucinations, unspecified: Secondary | ICD-10-CM

## 2016-07-09 DIAGNOSIS — R413 Other amnesia: Secondary | ICD-10-CM

## 2016-07-09 LAB — POCT GLYCOSYLATED HEMOGLOBIN (HGB A1C)
ESTIMATED AVERAGE GLUCOSE: 126
Hemoglobin A1C: 6

## 2016-07-09 NOTE — Progress Notes (Signed)
Patient: Sherry Brooks Female    DOB: Aug 24, 1928   83 y.o.   MRN: [id number] Visit Date: 07/09/2016  Today's Provider: Margaretann Loveless, PA-C   Chief Complaint  Patient presents with  . Follow-up    hypertension, Diabetes   Subjective:    HPI  Diabetes Mellitus Type II, Follow-up:   Lab Results  Component Value Date   HGBA1C 6.0 07/09/2016   HGBA1C 6.1 (H) 04/08/2016   HGBA1C 6.2 06/23/2015   Last seen for diabetes 3 months ago.  Management since then includes none. She reports excellent compliance with treatment. She is not having side effects.  Current symptoms include none and have been stable. Home blood sugar records: not checking  ------------------------------------------------------------------------   Hypertension, follow-up:  BP Readings from Last 3 Encounters:  07/09/16 132/70  06/10/16 (!) 177/81  04/23/16 (!) 190/95    She was last seen for hypertension 3 months ago.  BP at that visit was 190/95. Management since that visit includes none .She reports excellent compliance with treatment. She is not having side effects.  She is adherent to low salt diet.   She is experiencing fatigue and lower extremity edema.  Patient denies chest pain, chest pressure/discomfort, exertional chest pressure/discomfort, irregular heart beat, near-syncope and palpitations.   Cardiovascular risk factors include advanced age (older than 50 for men, 64 for women), diabetes mellitus, hypertension and cholesterol.   Daughter-in-law accompanies her today and brought a letter stating her concerns for her mother-in-law. The letter reports that the patient has been becoming more depressed and very forgetful. She does report the patient has a family history of depression in her mother and brother. Her brother was institutionalized at one point in his life, thus [place] has a stigma against mental illness. She has refused psychiatric treatment in the past. She does  take wellbutrin 75mg  BID.   She also reports in the letter that she has become more forgetful and sometimes does not take her medications at night. She has started to become more secluded in her home and is also possibly having hallucinations. The daughter-in-law reports the patient sees young children in her yard in the morning. I asked the patient about the children and she agrees she sees them in the morning while she is in her bathroom. She feels they should be in school and does not know why they aren't. She reports feeling sorry for them since they are not in school. The patient also states that she does not see them in the evening and that she knows no one believes her that they are there.  The daughter-in-law is also concerned because the patient is having short-term memory loss. She reports the patient will ask repetitive questions, and when told she has been told the answer previously she denies remembering. She has also started having personal care issues. The family is having trouble getting her to bathe and change clothes. She does have a care giver from 8-1p every day, except Thursday. The family takes turns checking on her in the evening. The patient states she is not scared to be home alone in the evenings because she has been doing it for the last 12 years since her husband passed. The daughter-in-law also brings up concerns that the patient will have incontinent episodes and change out of her clothes. Instead of putting them in the wash she forgets she had an accident and will hang the clothes back up in the closet. The family will  then find them the next day but the closet will smell of urine.   They have discussed retirement communities and assisted living facilities in the past. The patient has been interested in [place] in the past because her church is affiliated and she has some friends that live there. Today she states she wants to stay in her home as long as she can.   She is  also refusing to take her furosemide due to it increasing her urination and causing more accidents. She has been using lymphedema pumps twice daily and this is helping her lower extremity swelling.  ------------------------------------------------------------------------   No Known Allergies   Current Outpatient Prescriptions:  .  ALPRAZolam (XANAX) 0.5 MG tablet, Take 0.5-1 tablets (0.25-0.5 mg total) by mouth 2 (two) times daily as needed for anxiety., Disp: 60 tablet, Rfl: 5 .  ARTIFICIAL TEAR SOLUTION OP, Apply to eye., Disp: , Rfl:  .  B Complex Vitamins (VITAMIN B COMPLEX PO), , Disp: , Rfl:  .  buPROPion (WELLBUTRIN) 75 MG tablet, Take 1 tablet (75 mg total) by mouth 2 (two) times daily., Disp: 180 tablet, Rfl: 1 .  calcium carbonate (TUMS EX) 750 MG chewable tablet, Chew 1 tablet by mouth daily as needed for heartburn., Disp: , Rfl:  .  Calcium Carbonate-Vitamin D (CALCIUM-VITAMIN D) 600-125 MG-UNIT TABS, Take by mouth., Disp: , Rfl:  .  furosemide (LASIX) 20 MG tablet, TAKE 1 TABLET(20 MG) BY MOUTH DAILY AS NEEDED FOR SWELLING, Disp: 30 tablet, Rfl: 1 .  lisinopril-hydrochlorothiazide (PRINZIDE,ZESTORETIC) 20-25 MG tablet, TAKE 1 TABLET BY MOUTH EVERY DAY., Disp: 90 tablet, Rfl: 3 .  Multiple Vitamins-Minerals (PRESERVISION AREDS) CAPS, Take by mouth., Disp: , Rfl:  .  naproxen sodium (ANAPROX) 220 MG tablet, Take 220 mg by mouth 2 (two) times daily with a meal., Disp: , Rfl:  .  Olopatadine HCl (PATADAY) 0.2 % SOLN, Apply to eye., Disp: , Rfl:  .  pravastatin (PRAVACHOL) 10 MG tablet, TAKE 1 TABLET BY MOUTH EVERY NIGHT AT BEDTIME., Disp: 90 tablet, Rfl: 1 .  Psyllium (METAMUCIL PO), Take 1 capsule by mouth daily., Disp: , Rfl:  .  VITAMIN E PO, Take 1 tablet by mouth daily., Disp: , Rfl:   Review of Systems  Respiratory: Negative.   Cardiovascular: Positive for leg swelling. Negative for chest pain and palpitations.  Gastrointestinal: Negative.   Genitourinary: Positive for  enuresis.  Musculoskeletal: Positive for arthralgias.  Neurological: Positive for weakness. Negative for dizziness, syncope, light-headedness, numbness and headaches.  Psychiatric/Behavioral: Positive for dysphoric mood and hallucinations. Negative for behavioral problems, decreased concentration, self-injury, sleep disturbance and suicidal ideas. The patient is nervous/anxious.     Social History  Substance Use Topics  . Smoking status: Never Smoker  . Smokeless tobacco: Never Used  . Alcohol use Yes     Comment: glass of wine, rare occas.    Objective:   BP 132/70 (BP Location: Right Arm, Patient Position: Sitting, Cuff Size: Normal)   Pulse 65   Temp 97.7 F (36.5 C) (Oral)   Resp 16   Wt 216 lb 9.6 oz (98.2 kg)   BMI 37.18 kg/m   Physical Exam  Constitutional: She is oriented to person, place, and time. She appears well-developed and well-nourished. No distress.  Neck: Normal range of motion. Neck supple. No JVD present. No tracheal deviation present. No thyromegaly present.  Cardiovascular: Normal rate, regular rhythm and normal heart sounds.  Exam reveals no gallop and no friction rub.   No  murmur heard. Pulmonary/Chest: Effort normal and breath sounds normal. No respiratory distress. She has no wheezes. She has no rales.  Lymphadenopathy:    She has no cervical adenopathy.  Neurological: She is alert and oriented to person, place, and time. No cranial nerve deficit or sensory deficit.  Skin: She is not diaphoretic.  Psychiatric: Her speech is normal and behavior is normal. Judgment and thought content normal. She exhibits a depressed mood.  Vitals reviewed.   Cognitive Testing - 6-CIT  Correct? Score   What year is it? yes 0 0 or 4  What month is it? yes 0 0 or 3  Memorize:    Floyde Parkins[place],  [place],  44,  [address],  [place],      What time is it? (within 1 hour) yes 0 0 or 3  Count backwards from 20 no 4 0, 2, or 4  Name the months of the year yes 0 0, 2, or 4  Repeat name  & address above no 6 0, 2, 4, 6, 8, or 10       TOTAL SCORE  10/28   Interpretation:  Abnormal- mild  Normal (0-7) Abnormal (8-28)       Assessment & Plan:     1. Essential hypertension Stable. Continue lisinopril-hctz 20-25mg .   2. Type 2 diabetes mellitus with diabetic chronic kidney disease, unspecified CKD stage, unspecified long term insulin use status (HCC) Stable. A1c is 6.0 today.  - POCT glycosylated hemoglobin (Hb A1C)  3. Memory loss Patient is having worsening memory and cognitive issues with some behavioral changes and possible hallucinations of children in the morning. She declines wanting to add medications or change medications for depression at this time. With some convincing she was agreeable with help from her daughter-in-law for the neurocognitive testing at [facility]. The daughter-in-law [name](Donna) ask for us to call her with the appointment and request a morning appt if possible. I will f/u pending the results of the testing. - Ambulatory referral to Neurology  4. Hallucinations See above medical treatment plan. - Ambulatory referral to Neurology  5. Depression, recurrent (HCC) See above medical treatment plan. - Ambulatory referral to Neurology       Margaretann LovelessJennifer M Margalit Leece, PA-C  [facility]

## 2016-07-15 ENCOUNTER — Other Ambulatory Visit: Payer: Self-pay | Admitting: Physician Assistant

## 2016-07-15 DIAGNOSIS — F411 Generalized anxiety disorder: Secondary | ICD-10-CM

## 2016-07-15 NOTE — Telephone Encounter (Signed)
Called in to [facility] 

## 2016-07-29 ENCOUNTER — Encounter: Payer: Self-pay | Admitting: Family Medicine

## 2016-07-29 ENCOUNTER — Ambulatory Visit (INDEPENDENT_AMBULATORY_CARE_PROVIDER_SITE_OTHER): Payer: Medicare Other | Admitting: Family Medicine

## 2016-07-29 VITALS — BP 130/74 | HR 67 | Temp 97.9°F | Resp 16

## 2016-07-29 DIAGNOSIS — Z8719 Personal history of other diseases of the digestive system: Secondary | ICD-10-CM | POA: Insufficient documentation

## 2016-07-29 DIAGNOSIS — S42309A Unspecified fracture of shaft of humerus, unspecified arm, initial encounter for closed fracture: Secondary | ICD-10-CM | POA: Insufficient documentation

## 2016-07-29 DIAGNOSIS — T2124XA Burn of second degree of lower back, initial encounter: Secondary | ICD-10-CM | POA: Diagnosis not present

## 2016-07-29 NOTE — Patient Instructions (Signed)
Burn Care, Adult A burn is an injury to the skin or the tissues under the skin. There are three types of burns:  First degree. These burns may cause the skin to be red and a bit swollen.  Second degree. These burns are very painful and cause the skin to be very red. The skin may also leak fluid, look shiny, and start to have blisters.  Third degree. These burns cause permanent damage. They turn the skin white or black and make it look charred, dry, and leathery. Taking care of your burn properly can help to prevent pain and infection. It can also help the burn to heal more quickly. How is this treated? Right after a burn:   Lightly cover the burn with gauze.  Get medical help right away. Burn care   Raise (elevate) the injured area above the level of your heart while sitting or lying down.  Drink enough fluid to keep your pee (urine) clear or pale yellow.  Rest as told by your doctor. Do not start playing sports or doing other physical activities until your doctor says it is okay.  Follow instructions from your doctor about:  How to clean and take care of the burn.  When to change and remove the bandage (dressing).  Check your burn every day for signs of infection. Check for:  More redness, swelling, or pain.  Warmth.  Pus or a bad smell. Medicine   Take over-the-counter and prescription medicines only as told by your doctor.  If you were prescribed antibiotic medicine, take or apply it as told by your doctor. Do not stop using the antibiotic even if your condition improves. General instructions   To prevent infection:  Do not put butter, oil, or other home treatments on the burn.  Do not scratch or pick at the burn.  Do not break any blisters.  Do not peel skin.  Do not rub your burn, even when you are cleaning it.  Protect your burn from the sun.  Keep all follow-up visits as told by your doctor. This is important. Contact a doctor if:  Your condition  does not get better.  Your condition gets worse.  You have a fever.  Your burn looks different or starts to have black or red spots on it.  Your burn feels warm to the touch.  Your pain is not controlled with medicine. Get help right away if:  You have redness, swelling, or pain at the site of the burn.  You have fluid, blood, or pus coming from your burn.  You have red streaks near the burn.  You have very bad pain. How is this treated? Right after a burn:  Rinse or soak the burn under cool water until the pain stops. Do not put ice on your burn. That can cause more damage.  Lightly cover the burn with a clean (sterile) cloth (dressing). Burn care  Follow instructions from your doctor about:  How to clean and take care of the burn.  When to change and remove the cloth.  Check your burn every day for signs of infection. Check for:  More redness, swelling, or pain.  Warmth.  Pus or a bad smell. Medicine  Take over-the-counter and prescription medicines only as told by your doctor.  If you were prescribed antibiotic medicine, take or apply it as told by your doctor. Do not stop using the antibiotic even if your condition improves. General instructions  To prevent infection, do not put   butter, oil, or other home treatments on your burn.  Do not rub your burn, even when you are cleaning it.  Protect your burn from the sun. How is this treated? Right after a burn:  Rinse or soak the burn under cool water. Do this for several minutes. Do not put ice on your burn. That can cause more damage.  Lightly cover the burn with a clean (sterile) cloth (dressing). Burn care  Raise (elevate) the injured area above the level of your heart while sitting or lying down.  Follow instructions from your doctor about:  How to clean and take care of the burn.  When to change and remove the cloth.  Check your burn every day for signs of infection. Check for:  More redness,  swelling, or pain.  Warmth.  Pus or a bad smell. Medicine   Take over-the-counter and prescription medicines only as told by your doctor.  If you were prescribed antibiotic medicine, take or apply it as told by your doctor. Do not stop using the antibiotic even if your condition improves. General instructions  To prevent infection:  Do not put butter, oil, or other home treatments on the burn.  Do not scratch or pick at the burn.  Do not break any blisters.  Do not peel skin.  Do not rub your burn, even when you are cleaning it.  Protect your burn from the sun. This information is not intended to replace advice given to you by your health care provider. Make sure you discuss any questions you have with your health care provider. Document Released: 02/27/2008 Document Revised: 12/10/2015 Document Reviewed: 11/07/2015 Elsevier Interactive Patient Education  2017 [employer]  

## 2016-07-29 NOTE — Progress Notes (Signed)
Patient: Sherry Brooks Female    DOB: Dec 17, 1928   83 y.o.   MRN: [id number] Visit Date: 07/29/2016  Today's Provider: Dortha Kernennis Cruise Baumgardner, PA   Chief Complaint  Patient presents with  . Burn   Subjective:    Burn  The incident occurred more than 1 week ago. Incident location: in the bed while using the heating pad on high and patient accidentally fell asleep. Burn context: heating pad. The burns were a result of contact with a hot surface. The burns are located on the back. The patient is experiencing no pain. Treatments tried: Neosporin and bandage. The treatment provided mild relief.   Past Medical History:  Diagnosis Date  . Anxiety   . Arthritis    knees  . Borderline diabetic   . Complication of anesthesia    wakes up slowly, takes longer than usual to wake  . Depression   . Diabetes (HCC)   . GERD (gastroesophageal reflux disease)    on occas. uses TUMS for indigestion   . Heart murmur   . History of stress test    10 yrs. ago, states it was normal , had been ordered for pt. due to stress related to husband's illness,, by Dr. Vic RipperNancy Malone  . Hyperlipemia   . Hypertension   . Macular degeneration   . Peripheral vascular disease (HCC)    blood clots in legs mid 2000's   . Renal calculus    history of > 50 yrs. ago   Past Surgical History:  Procedure Laterality Date  . APPENDECTOMY     as a teenager   . BIOPSY THYROID  06/2008  . COLON SURGERY     due to diverticulitis- had colostomy, then takedown   . EYE SURGERY     cataracts removed - /w IOL  . FRACTURE SURGERY     L wrist, plate in place  . JOINT REPLACEMENT     both knees   . SPINE SURGERY  10/23/2012   Spinal Fusion, lower back   Family History  Problem Relation Age of Onset  . Arthritis Mother   . Arthritis Father   . Bladder Cancer Neg Hx   . Kidney cancer Neg Hx   . Prostate cancer Neg Hx    No Known Allergies    Previous Medications   ALPRAZOLAM (XANAX) 0.5 MG TABLET    TAKE 1/2 TO 1  TABLET BY MOUTH TWICE DAILY AS NEEDED FOR ANXIETY   ARTIFICIAL TEAR SOLUTION OP    Apply to eye.   B COMPLEX VITAMINS (VITAMIN B COMPLEX PO)       BUPROPION (WELLBUTRIN) 75 MG TABLET    Take 1 tablet (75 mg total) by mouth 2 (two) times daily.   CALCIUM CARBONATE (TUMS EX) 750 MG CHEWABLE TABLET    Chew 1 tablet by mouth daily as needed for heartburn.   CALCIUM CARBONATE-VITAMIN D (CALCIUM-VITAMIN D) 600-125 MG-UNIT TABS    Take by mouth.   FUROSEMIDE (LASIX) 20 MG TABLET    TAKE 1 TABLET(20 MG) BY MOUTH DAILY AS NEEDED FOR SWELLING   LISINOPRIL-HYDROCHLOROTHIAZIDE (PRINZIDE,ZESTORETIC) 20-25 MG TABLET    TAKE 1 TABLET BY MOUTH EVERY DAY.   MULTIPLE VITAMINS-MINERALS (PRESERVISION AREDS) CAPS    Take by mouth.   NAPROXEN SODIUM (ANAPROX) 220 MG TABLET    Take 220 mg by mouth 2 (two) times daily with a meal.   OLOPATADINE HCL (PATADAY) 0.2 % SOLN    Apply to eye.   PRAVASTATIN (PRAVACHOL)  10 MG TABLET    TAKE 1 TABLET BY MOUTH EVERY NIGHT AT BEDTIME.   PSYLLIUM (METAMUCIL PO)    Take 1 capsule by mouth daily.   VITAMIN E PO    Take 1 tablet by mouth daily.    Review of Systems  Constitutional: Negative.   Respiratory: Negative.   Skin:       Blistered, redness     Social History  Substance Use Topics  . Smoking status: Never Smoker  . Smokeless tobacco: Never Used  . Alcohol use Yes     Comment: glass of wine, rare occas.    Objective:   BP 130/74 (BP Location: Right Arm, Patient Position: Sitting, Cuff Size: Normal)   Pulse 67   Temp 97.9 F (36.6 C) (Oral)   Resp 16   SpO2 99%   Physical Exam  Constitutional: She is oriented to person, place, and time. She appears well-developed and well-nourished. No distress.  HENT:  Head: Normocephalic and atraumatic.  Right Ear: Hearing normal.  Left Ear: Hearing normal.  Nose: Nose normal.  Eyes: Conjunctivae and lids are normal. Right eye exhibits no discharge. Left eye exhibits no discharge. No scleral icterus.  Pulmonary/Chest:  Effort normal. No respiratory distress.  Musculoskeletal: Normal range of motion.  Neurological: She is alert and oriented to person, place, and time.  Skin: Skin is intact. No lesion noted.     Psychiatric: She has a normal mood and affect. Her speech is normal and behavior is normal. Thought content normal.      Assessment & Plan:     1. Partial thickness burn of back, initial encounter Onset 07-20-16 when she fell asleep on a heating pad. No sign of infection. Treat with cleansing daily and applying Silvadene by Telfa pad dressing. Last Tdap was October 2012. Recommend family finds a new heating pad that will turn itself off before burns occur or has a "dead man's switch". Recheck in a week if needed.

## 2016-08-21 ENCOUNTER — Ambulatory Visit: Payer: Medicare Other | Admitting: Podiatry

## 2016-08-23 ENCOUNTER — Encounter: Payer: Self-pay | Admitting: Physician Assistant

## 2016-08-23 ENCOUNTER — Ambulatory Visit (INDEPENDENT_AMBULATORY_CARE_PROVIDER_SITE_OTHER): Payer: Medicare Other | Admitting: Physician Assistant

## 2016-08-23 VITALS — BP 126/64 | HR 80 | Temp 97.6°F | Resp 16

## 2016-08-23 DIAGNOSIS — B353 Tinea pedis: Secondary | ICD-10-CM | POA: Diagnosis not present

## 2016-08-23 MED ORDER — KETOCONAZOLE 2 % EX CREA: 1.0000 "application " | TOPICAL_CREAM | Freq: Two times a day (BID) | CUTANEOUS | 0 refills | Status: DC

## 2016-08-23 NOTE — Progress Notes (Signed)
Patient: Sherry Brooks Female    DOB: 1928/10/20   83 y.o.   MRN: [id number] Visit Date: 08/23/2016  Today's Provider: Margaretann Loveless, PA-C   Chief Complaint  Patient presents with  . Skin Problem   Subjective:    HPI   Skin Problem Sherry Brooks is here today for examination of a skin lesion on the top of her right foot. The lesion has been present for 2 weeks, and is growing in size. Pt denies pruritis or tenderness. Pt has a H/O chronic venous insufficiency, and is borderline diabetic. Last A1C was 6.0% on 07/09/2016.  No Known Allergies   Current Outpatient Prescriptions:  .  ALPRAZolam (XANAX) 0.5 MG tablet, TAKE 1/2 TO 1 TABLET BY MOUTH TWICE DAILY AS NEEDED FOR ANXIETY, Disp: 60 tablet, Rfl: 5 .  ARTIFICIAL TEAR SOLUTION OP, Apply to eye., Disp: , Rfl:  .  B Complex Vitamins (VITAMIN B COMPLEX PO), , Disp: , Rfl:  .  buPROPion (WELLBUTRIN) 75 MG tablet, Take 1 tablet (75 mg total) by mouth 2 (two) times daily., Disp: 180 tablet, Rfl: 1 .  calcium carbonate (TUMS EX) 750 MG chewable tablet, Chew 1 tablet by mouth daily as needed for heartburn., Disp: , Rfl:  .  Calcium Carbonate-Vitamin D (CALCIUM-VITAMIN D) 600-125 MG-UNIT TABS, Take by mouth., Disp: , Rfl:  .  furosemide (LASIX) 20 MG tablet, TAKE 1 TABLET(20 MG) BY MOUTH DAILY AS NEEDED FOR SWELLING, Disp: 30 tablet, Rfl: 1 .  lisinopril-hydrochlorothiazide (PRINZIDE,ZESTORETIC) 20-25 MG tablet, TAKE 1 TABLET BY MOUTH EVERY DAY., Disp: 90 tablet, Rfl: 3 .  Multiple Vitamins-Minerals (PRESERVISION AREDS) CAPS, Take by mouth., Disp: , Rfl:  .  naproxen sodium (ANAPROX) 220 MG tablet, Take 220 mg by mouth 2 (two) times daily with a meal., Disp: , Rfl:  .  Olopatadine HCl (PATADAY) 0.2 % SOLN, Apply to eye., Disp: , Rfl:  .  pravastatin (PRAVACHOL) 10 MG tablet, TAKE 1 TABLET BY MOUTH EVERY NIGHT AT BEDTIME., Disp: 90 tablet, Rfl: 1 .  Psyllium (METAMUCIL PO), Take 1 capsule by mouth daily., Disp: , Rfl:  .   VITAMIN E PO, Take 1 tablet by mouth daily., Disp: , Rfl:   Review of Systems  Constitutional: Negative.   Respiratory: Negative.   Cardiovascular: Positive for leg swelling.  Gastrointestinal: Negative.   Skin: Positive for rash.  Neurological: Negative.     Social History  Substance Use Topics  . Smoking status: Never Smoker  . Smokeless tobacco: Never Used  . Alcohol use Yes     Comment: glass of wine, rare occas.    Objective:   BP 126/64 (BP Location: Left Arm, Patient Position: Sitting, Cuff Size: Large)   Pulse 80   Temp 97.6 F (36.4 C) (Oral)   Resp 16  Vitals:   08/23/16 1003  BP: 126/64  Pulse: 80  Resp: 16  Temp: 97.6 F (36.4 C)  TempSrc: Oral     Physical Exam  Constitutional: She appears well-developed and well-nourished. No distress.  Neck: Normal range of motion. Neck supple.  Cardiovascular: Normal rate, regular rhythm and normal heart sounds.  Exam reveals no gallop and no friction rub.   No murmur heard. Pulmonary/Chest: Effort normal and breath sounds normal. No respiratory distress. She has no wheezes. She has no rales.  Musculoskeletal: She exhibits edema (2+ pitting edema).  Skin: She is not diaphoretic.     Vitals reviewed.       Assessment &  Plan:     1. Tinea pedis of right foot Suspect ringworm infection of the right foot. Ketoconazole cream given to patient. They are to call if no improvement. - ketoconazole (NIZORAL) 2 % cream; Apply 1 application topically 2 (two) times daily.  Dispense: 15 g; Refill: 0     Patient seen and examined by Margaretann LovelessJennifer M Burnette, PA-C, and note scribed by Allene DillonEmily Drozdowski, CMA.  Margaretann LovelessJennifer M Burnette, PA-C  [facility]

## 2016-08-23 NOTE — Patient Instructions (Signed)
Body Ringworm Body ringworm is an infection of the skin that often causes a ring-shaped rash. Body ringworm can affect any part of your skin. It can spread easily to others. Body ringworm is also called tinea corporis. What are the causes? This condition is caused by funguses called dermatophytes. The condition develops when these funguses grow out of control on the skin. You can get this condition if you touch a person or animal that has it. You can also get it if you share clothing, bedding, towels, or any other object with an infected person or pet. What increases the risk? This condition is more likely to develop in:  Athletes who often make skin-to-skin contact with other athletes, such as wrestlers.  People who share equipment and mats.  People with a weakened immune system. What are the signs or symptoms? Symptoms of this condition include:  Itchy, raised red spots and bumps.  Red scaly patches.  A ring-shaped rash. The rash may have:  A clear center.  Scales or red bumps at its center.  Redness near its borders.  Dry and scaly skin on or around it. How is this diagnosed? This condition can usually be diagnosed with a skin exam. A skin scraping may be taken from the affected area and examined under a microscope to see if the fungus is present. How is this treated? This condition may be treated with:  An antifungal cream or ointment.  An antifungal shampoo.  Antifungal medicines. These may be prescribed if your ringworm is severe, keeps coming back, or lasts a long time. Follow these instructions at home:  Take over-the-counter and prescription medicines only as told by your health care provider.  If you were given an antifungal cream or ointment:  Use it as told by your health care provider.  Wash the infected area and dry it completely before applying the cream or ointment.  If you were given an antifungal shampoo:  Use it as told by your health care  provider.  Leave the shampoo on your body for 3-5 minutes before rinsing.  While you have a rash:  Wear loose clothing to stop clothes from rubbing and irritating it.  Wash or change your bed sheets every night.  If your pet has the same infection, take your pet to see a veterinarian. How is this prevented?  Practice good hygiene.  Wear sandals or shoes in public places and showers.  Do not share personal items with others.  Avoid touching red patches of skin on other people.  Avoid touching pets that have bald spots.  If you touch an animal that has a bald spot, wash your hands. Contact a health care provider if:  Your rash continues to spread after 7 days of treatment.  Your rash is not gone in 4 weeks.  The area around your rash gets red, warm, tender, and swollen. This information is not intended to replace advice given to you by your health care provider. Make sure you discuss any questions you have with your health care provider. Document Released: 05/17/2000 Document Revised: 10/26/2015 Document Reviewed: 03/16/2015 Elsevier Interactive Patient Education  2017 Elsevier Inc.  

## 2016-08-26 ENCOUNTER — Ambulatory Visit (INDEPENDENT_AMBULATORY_CARE_PROVIDER_SITE_OTHER): Payer: Medicare Other | Admitting: Psychology

## 2016-08-26 ENCOUNTER — Encounter: Payer: Self-pay | Admitting: Psychology

## 2016-08-26 DIAGNOSIS — R413 Other amnesia: Secondary | ICD-10-CM | POA: Diagnosis not present

## 2016-08-26 NOTE — Progress Notes (Signed)
NEUROPSYCHOLOGICAL INTERVIEW (CPT: [id number])  Name: Sherry Brooks Date of Birth: [birth year] Date of Interview: 08/26/2016  Reason for Referral:  Sherry Brooks is a 83 y.o. female who is referred for neuropsychological evaluation by Joycelyn ManJennifer Burnette of [facility] due to concerns about memory loss and behavioral changes. This patient is accompanied in the office by her daughter in law, [name], who supplements the history.  History of Presenting Problem:  Sherry Brooks endorses concerns about her memory, stated that she cannot remember names of people, especially in the morning. She endorses some forgetfulness for recent conversations. Her daughter in law reports forgetfulness for recent conversations and events, frequent repetition of questions, and a tendency to say something totally out of context in the middle of a conversation. She also has been having visual hallucinations of children in her yard. She reported that it bothers her that the children are not in school but otherwise she is not upset by seeing them there.   Sherry Brooks continues to live alone although she does have caregivers for several hours a day 6 days a week. Her daughter in law also visits her 2-3 times a week and takes her to doctor's appointments and other errands. The patient reported that she cannot do much anymore because of vision loss secondary to macular degeneration. She has not driven in 4-5 years. [name] fills her pillboxes and the caregivers oversee the patient's self-administration. [name] manages her bills and finances. [name] manages her appointments. The caregivers do the cooking and laundry. A housekeeper comes every two weeks. The patient showers independently and dresses independently. According to records reviewed, the patient is having urinary incontinence.  Physically, she endorses back pain and knee pain. She uses a rollator which she calls her "[place]". She has had several  falls. She fell three times at the end of 2017. She did not sustain any injuries or LOC. One time, she fell at home in the evening and did not want to bother anyone so she stayed on the floor overnight until her caregiver arrived in the morning. She does have a fall alert necklace but it does not appear that she wears it regularly. (Perhaps her caregivers could make sure she has it on when they leave in the afternoon.)  There is no family history of dementia that the patient is aware of, but she notes that most of her family stayed in [place]. (She moved from [place] the [place] age 83 with her husband.)  With regard to mood, the patient reports that she is sad much of the time. She endorses feeling lonely although she is not physically alone that often, and she typically does not want to leave her home for social outings. She has some friends who come visit her and talk to her on the phone. She sees her daughter in law regularly.   The patient reported a lifelong history of anxiety characterized by worrying excessively. She takes Xanax once a day every day. She is also prescribed Wellbutrin. She denied any difficulty with sleeping. She takes an afternoon nap. She denies any problem with appetite.   Of note, Sherry Brooks has been talking a lot about death and wanting to join her husband. She says she has "had enough" and doesn't want to be here anymore. However, she denies suicidal intention, stating that it is "against God's law".    Social History: Born/Raised: [place]. The patient completed the equivalent of a high school education in [place]. The patient  immigrated to the [place] in 1957 at age 68 with her husband. She did not speak English at the time. She and her husband lived in [place] for 9 years. Her husband was transferred to [facility] and she has lived here ever since. She previously worked as a Neurosurgeon. She and her husband were married 50 years. He passed away 12 years ago. They had two children,  a son and a daughter. Her son is now deceased. She has been estranged from her daughter for many years. The patient has six grandchildren.  Alcohol/Tobacco/Substances: Occasional glass of wine, was never a smoker. No SA.   Medical History: Past Medical History:  Diagnosis Date  . Anxiety   . Arthritis    knees  . Borderline diabetic   . Complication of anesthesia    wakes up slowly, takes longer than usual to wake  . Depression   . Diabetes (HCC)   . GERD (gastroesophageal reflux disease)    on occas. uses TUMS for indigestion   . Heart murmur   . History of stress test    10 yrs. ago, states it was normal , had been ordered for pt. due to stress related to husband's illness,, by Dr. Vic Ripper  . Hyperlipemia   . Hypertension   . Macular degeneration   . Peripheral vascular disease (HCC)    blood clots in legs mid 2000's   . Renal calculus    history of > 50 yrs. ago    Current Medications:  Outpatient Encounter Prescriptions as of 08/26/2016  Medication Sig  . ALPRAZolam (XANAX) 0.5 MG tablet TAKE 1/2 TO 1 TABLET BY MOUTH TWICE DAILY AS NEEDED FOR ANXIETY  . ARTIFICIAL TEAR SOLUTION OP Apply to eye.  . B Complex Vitamins (VITAMIN B COMPLEX PO)   . buPROPion (WELLBUTRIN) 75 MG tablet Take 1 tablet (75 mg total) by mouth 2 (two) times daily.  . calcium carbonate (TUMS EX) 750 MG chewable tablet Chew 1 tablet by mouth daily as needed for heartburn.  . Calcium Carbonate-Vitamin D (CALCIUM-VITAMIN D) 600-125 MG-UNIT TABS Take by mouth.  . furosemide (LASIX) 20 MG tablet TAKE 1 TABLET(20 MG) BY MOUTH DAILY AS NEEDED FOR SWELLING  . ketoconazole (NIZORAL) 2 % cream Apply 1 application topically 2 (two) times daily.  Marland Kitchen lisinopril-hydrochlorothiazide (PRINZIDE,ZESTORETIC) 20-25 MG tablet TAKE 1 TABLET BY MOUTH EVERY DAY.  . Multiple Vitamins-Minerals (PRESERVISION AREDS) CAPS Take by mouth.  . naproxen sodium (ANAPROX) 220 MG tablet Take 220 mg by mouth 2 (two) times daily with a  meal.  . Olopatadine HCl (PATADAY) 0.2 % SOLN Apply to eye.  . pravastatin (PRAVACHOL) 10 MG tablet TAKE 1 TABLET BY MOUTH EVERY NIGHT AT BEDTIME.  Marland Kitchen Psyllium (METAMUCIL PO) Take 1 capsule by mouth daily.  Marland Kitchen VITAMIN E PO Take 1 tablet by mouth daily.   No facility-administered encounter medications on file as of 08/26/2016.     Behavioral Observations:   Appearance: Neatly and appropriately dressed and groomed Gait: Ambulated with rollater, slow gait, poor balance Speech: Fluent; normal rate, rhythm and volume Thought process: Linear, goal directed Affect: Blunted, appropriate, dysthymic but brightens occasionally Interpersonal: Pleasant, appropriate   TESTING: There is medical necessity to proceed with neuropsychological assessment as the results will be used to aid in differential diagnosis and clinical decision-making and to inform specific treatment recommendations. Per the patient, her daughter in law, and medical records reviewed, there has been a change in cognitive functioning and a reasonable suspicion of dementia.  PLAN: The patient will return for a full battery of neuropsychological testing with a psychometrician under my supervision. Education regarding testing procedures was provided. Subsequently, the patient will see this provider for a follow-up session at which time her test performances and my impressions and treatment recommendations will be reviewed in detail.   Full neuropsychological evaluation report to follow.

## 2016-09-09 ENCOUNTER — Ambulatory Visit (INDEPENDENT_AMBULATORY_CARE_PROVIDER_SITE_OTHER): Payer: Medicare Other | Admitting: Psychology

## 2016-09-09 ENCOUNTER — Ambulatory Visit (INDEPENDENT_AMBULATORY_CARE_PROVIDER_SITE_OTHER): Payer: Medicare Other | Admitting: Vascular Surgery

## 2016-09-09 DIAGNOSIS — R413 Other amnesia: Secondary | ICD-10-CM

## 2016-09-09 NOTE — Progress Notes (Signed)
   Neuropsychology Note  Sherry Brooks returned today for 1 hour of neuropsychological testing with technician, Wallace Keller, BS, under the supervision of Dr. Elvis Coil. The patient did not appear overtly distressed by the testing session, per behavioral observation or via self-report to the technician. Rest breaks were offered. Sherry Brooks will return within 2 weeks for a feedback session with Dr. Alinda Dooms at which time her test performances, clinical impressions and treatment recommendations will be reviewed in detail. The patient understands she can contact our office should she require our assistance before this time.  Full report to follow.

## 2016-09-11 ENCOUNTER — Encounter (INDEPENDENT_AMBULATORY_CARE_PROVIDER_SITE_OTHER): Payer: Self-pay | Admitting: Vascular Surgery

## 2016-09-11 ENCOUNTER — Ambulatory Visit (INDEPENDENT_AMBULATORY_CARE_PROVIDER_SITE_OTHER): Payer: Medicare Other | Admitting: Vascular Surgery

## 2016-09-11 VITALS — BP 212/88 | HR 65 | Resp 16 | Ht 64.0 in | Wt 220.0 lb

## 2016-09-11 DIAGNOSIS — I890 Lymphedema, not elsewhere classified: Secondary | ICD-10-CM

## 2016-09-11 NOTE — Progress Notes (Signed)
Subjective:    Patient ID: Sherry Brooks, female    DOB: 09-03-1928, 83 y.o.   MRN: [id number] Chief Complaint  Patient presents with  . Re-evaluation    3 month lymph check   Patient presents for a three month lymphedema follow up. Since our last visit, she has received her lymphedema pump. Seen with her daughter today. Both her daughter and the patient state improvement in her lower extremity edema. States improvement in her lower extremity discomfort as well. She denies any fever, nausea or vomiting.    Review of Systems  Constitutional: Negative.   HENT: Negative.   Eyes: Negative.   Respiratory: Negative.   Cardiovascular: Positive for leg swelling.  Gastrointestinal: Negative.   Endocrine: Negative.   Genitourinary: Negative.   Musculoskeletal: Negative.   Skin: Negative.   Allergic/Immunologic: Negative.   Neurological: Negative.   Hematological: Negative.   Psychiatric/Behavioral: Negative.       Objective:   Physical Exam  Constitutional: She is oriented to person, place, and time. She appears well-developed and well-nourished. No distress.  HENT:  Head: Normocephalic and atraumatic.  Eyes: Conjunctivae are normal. Pupils are equal, round, and reactive to light.  Neck: Normal range of motion.  Cardiovascular: Normal rate, regular rhythm and normal heart sounds.   Pulmonary/Chest: Effort normal.  Musculoskeletal: Normal range of motion. She exhibits edema (Right Lower Extremity > Left Lower Extremity - Mild Edema).  Neurological: She is alert and oriented to person, place, and time.  Skin: Skin is warm and dry. She is not diaphoretic.  Psychiatric: She has a normal mood and affect. Her behavior is normal. Judgment and thought content normal.  Vitals reviewed.  BP (!) 212/88 (BP Location: Right Arm)   Pulse 65   Resp 16   Ht  (1.626 m)   Wt 220 lb (99.8 kg)   BMI 37.76 kg/m   Past Medical History:  Diagnosis Date  . Anxiety   . Arthritis    knees  . Borderline diabetic   . Complication of anesthesia    wakes up slowly, takes longer than usual to wake  . Depression   . Diabetes (HCC)   . GERD (gastroesophageal reflux disease)    on occas. uses TUMS for indigestion   . Heart murmur   . History of stress test    10 yrs. ago, states it was normal , had been ordered for pt. due to stress related to husband's illness,, by Dr. Vic Ripper  . Hyperlipemia   . Hypertension   . Macular degeneration   . Peripheral vascular disease (HCC)    blood clots in legs mid 2000's   . Renal calculus    history of > 50 yrs. ago   Social History   Social History  . Marital status: Widowed    Spouse name: N/A  . Number of children: N/A  . Years of education: N/A   Occupational History  . Not on file.   Social History Main Topics  . Smoking status: Never Smoker  . Smokeless tobacco: Never Used  . Alcohol use Yes     Comment: glass of wine, rare occas.   . Drug use: No  . Sexual activity: No   Other Topics Concern  . Not on file   Social History Narrative  . No narrative on file   Past Surgical History:  Procedure Laterality Date  . APPENDECTOMY     as a teenager   . BIOPSY THYROID  06/2008  .  COLON SURGERY     due to diverticulitis- had colostomy, then takedown   . EYE SURGERY     cataracts removed - /w IOL  . FRACTURE SURGERY     L wrist, plate in place  . JOINT REPLACEMENT     both knees   . SPINE SURGERY  10/23/2012   Spinal Fusion, lower back   Family History  Problem Relation Age of Onset  . Arthritis Mother   . Arthritis Father   . Bladder Cancer Neg Hx   . Kidney cancer Neg Hx   . Prostate cancer Neg Hx    No Known Allergies     Assessment & Plan:  Patient presents for a three month lymphedema follow up. Since our last visit, she has received her lymphedema pump. Seen with her daughter today. Both her daughter and the patient state improvement in her lower extremity edema. States improvement in her  lower extremity discomfort as well. She denies any fever, nausea or vomiting.   1. Lymphedema - Improvement Patient now has lymphedema pump. Improvement with lymphedema pump. Continue compression and elevation. Follow up PRN The patient was instructed to call the office in the interim if any worsening edema or ulcerations to the legs, feet or toes occurs. The patient expresses their understanding.  Current Outpatient Prescriptions on File Prior to Visit  Medication Sig Dispense Refill  . ALPRAZolam (XANAX) 0.5 MG tablet TAKE 1/2 TO 1 TABLET BY MOUTH TWICE DAILY AS NEEDED FOR ANXIETY 60 tablet 5  . ARTIFICIAL TEAR SOLUTION OP Apply to eye.    . B Complex Vitamins (VITAMIN B COMPLEX PO)     . buPROPion (WELLBUTRIN) 75 MG tablet Take 1 tablet (75 mg total) by mouth 2 (two) times daily. 180 tablet 1  . calcium carbonate (TUMS EX) 750 MG chewable tablet Chew 1 tablet by mouth daily as needed for heartburn.    . Calcium Carbonate-Vitamin D (CALCIUM-VITAMIN D) 600-125 MG-UNIT TABS Take by mouth.    . furosemide (LASIX) 20 MG tablet TAKE 1 TABLET(20 MG) BY MOUTH DAILY AS NEEDED FOR SWELLING 30 tablet 1  . ketoconazole (NIZORAL) 2 % cream Apply 1 application topically 2 (two) times daily. 15 g 0  . lisinopril-hydrochlorothiazide (PRINZIDE,ZESTORETIC) 20-25 MG tablet TAKE 1 TABLET BY MOUTH EVERY DAY. 90 tablet 3  . Multiple Vitamins-Minerals (PRESERVISION AREDS) CAPS Take by mouth.    . naproxen sodium (ANAPROX) 220 MG tablet Take 220 mg by mouth 2 (two) times daily with a meal.    . Olopatadine HCl (PATADAY) 0.2 % SOLN Apply to eye.    . pravastatin (PRAVACHOL) 10 MG tablet TAKE 1 TABLET BY MOUTH EVERY NIGHT AT BEDTIME. 90 tablet 1  . Psyllium (METAMUCIL PO) Take 1 capsule by mouth daily.    Marland Kitchen VITAMIN E PO Take 1 tablet by mouth daily.     No current facility-administered medications on file prior to visit.     There are no Patient Instructions on file for this visit. No Follow-up on  file.   Radonna Bracher A Makinzie Considine, PA-C

## 2016-09-13 ENCOUNTER — Encounter: Payer: Self-pay | Admitting: Physician Assistant

## 2016-09-13 ENCOUNTER — Ambulatory Visit (INDEPENDENT_AMBULATORY_CARE_PROVIDER_SITE_OTHER): Payer: Medicare Other | Admitting: Physician Assistant

## 2016-09-13 VITALS — BP 120/84 | HR 72 | Temp 98.3°F | Resp 16 | Wt 221.4 lb

## 2016-09-13 DIAGNOSIS — N3944 Nocturnal enuresis: Secondary | ICD-10-CM | POA: Diagnosis not present

## 2016-09-13 DIAGNOSIS — R3981 Functional urinary incontinence: Secondary | ICD-10-CM | POA: Diagnosis not present

## 2016-09-13 DIAGNOSIS — R350 Frequency of micturition: Secondary | ICD-10-CM | POA: Diagnosis not present

## 2016-09-13 LAB — POCT URINALYSIS DIPSTICK
Bilirubin, UA: NEGATIVE
Blood, UA: NEGATIVE
Glucose, UA: NEGATIVE
KETONES UA: NEGATIVE
LEUKOCYTES UA: NEGATIVE
NITRITE UA: NEGATIVE
PH UA: 5 (ref 5.0–8.0)
PROTEIN UA: NEGATIVE
Spec Grav, UA: 1.025 (ref 1.010–1.025)
Urobilinogen, UA: 0.2 E.U./dL

## 2016-09-13 NOTE — Progress Notes (Signed)
Patient: Sherry Brooks Female    DOB: July 06, 1928   83 y.o.   MRN: [id number] Visit Date: 09/13/2016  Today's Provider: Margaretann Loveless, PA-C   Chief Complaint  Patient presents with  . Urinary Tract Infection   Subjective:    Patient here today with her daughter in law.  Patient here C/O of frequent urination reports symptoms are worse through the night. Patient reports she has to go about 5 times during the night. Patient denies any fever, painful urination or vaginal discharge. She has been seen by Dr. Sherryl Barters in 01/2016 and was placed on Mybetriq . She was unable to tolerate this medication as it caused confusion and abnormal gait.  Patient reports that she has been using lymphedema pump for about 2-3 months and reports that it makes her urinate more. She is followed by vascular surgery for this and most recently evaluated on 09/11/16.  Patient reports that she is not taking furosemide, reports she does not like the effects of the medication. She states this makes her have more accidents. Her daughter-in-law has been trying to get her to wear depends during the day. She has been wearing them more at night of recent. Her daughter-in-law states the urinary accidents occur more during the daytime hours than through the night.     No Known Allergies   Current Outpatient Prescriptions:  .  ALPRAZolam (XANAX) 0.5 MG tablet, TAKE 1/2 TO 1 TABLET BY MOUTH TWICE DAILY AS NEEDED FOR ANXIETY, Disp: 60 tablet, Rfl: 5 .  ARTIFICIAL TEAR SOLUTION OP, Apply to eye., Disp: , Rfl:  .  B Complex Vitamins (VITAMIN B COMPLEX PO), , Disp: , Rfl:  .  buPROPion (WELLBUTRIN) 75 MG tablet, Take 1 tablet (75 mg total) by mouth 2 (two) times daily. (Patient taking differently: Take 75 mg by mouth daily. ), Disp: 180 tablet, Rfl: 1 .  calcium carbonate (TUMS EX) 750 MG chewable tablet, Chew 1 tablet by mouth daily as needed for heartburn., Disp: , Rfl:  .  Calcium Carbonate-Vitamin D  (CALCIUM-VITAMIN D) 600-125 MG-UNIT TABS, Take by mouth., Disp: , Rfl:  .  lisinopril-hydrochlorothiazide (PRINZIDE,ZESTORETIC) 20-25 MG tablet, TAKE 1 TABLET BY MOUTH EVERY DAY., Disp: 90 tablet, Rfl: 3 .  Multiple Vitamins-Minerals (PRESERVISION AREDS) CAPS, Take by mouth., Disp: , Rfl:  .  Olopatadine HCl (PATADAY) 0.2 % SOLN, Apply to eye., Disp: , Rfl:  .  pravastatin (PRAVACHOL) 10 MG tablet, TAKE 1 TABLET BY MOUTH EVERY NIGHT AT BEDTIME., Disp: 90 tablet, Rfl: 1 .  Psyllium (METAMUCIL PO), Take 1 capsule by mouth daily., Disp: , Rfl:  .  VITAMIN E PO, Take 1 tablet by mouth daily., Disp: , Rfl:  .  furosemide (LASIX) 20 MG tablet, TAKE 1 TABLET(20 MG) BY MOUTH DAILY AS NEEDED FOR SWELLING (Patient not taking: Reported on 09/13/2016), Disp: 30 tablet, Rfl: 1  Review of Systems  Constitutional: Positive for activity change.  Respiratory: Negative.   Cardiovascular: Positive for leg swelling.  Gastrointestinal: Negative.   Genitourinary: Positive for enuresis, frequency and urgency. Negative for dysuria, flank pain, hematuria and pelvic pain.  Musculoskeletal: Negative for back pain.  Neurological: Negative for dizziness and headaches.    Social History  Substance Use Topics  . Smoking status: Never Smoker  . Smokeless tobacco: Never Used  . Alcohol use Yes     Comment: glass of wine, rare occas.    Objective:   BP 120/84 (BP Location: Left Arm, Patient Position:  Sitting, Cuff Size: Large)   Pulse 72   Temp 98.3 F (36.8 C) (Oral)   Resp 16   Wt 221 lb 6.4 oz (100.4 kg)   SpO2 97%   BMI 38.00 kg/m  Vitals:   09/13/16 1144  BP: 120/84  Pulse: 72  Resp: 16  Temp: 98.3 F (36.8 C)  TempSrc: Oral  SpO2: 97%  Weight: 221 lb 6.4 oz (100.4 kg)     Physical Exam  Constitutional: She appears well-developed and well-nourished. No distress.  Neck: Normal range of motion. Neck supple.  Cardiovascular: Normal rate, regular rhythm and normal heart sounds.  Exam reveals no  gallop and no friction rub.   No murmur heard. Pulmonary/Chest: Effort normal and breath sounds normal. No respiratory distress. She has no wheezes. She has no rales.  Skin: She is not diaphoretic.  Vitals reviewed.     Assessment & Plan:     1. Frequency of urination UA was normal today in the office. Will place referral to Urology to see if she may be a candidate for PTNS since she is not a candidate for oral medications due to side effects or adverse reactions. She was also concerned that her kidney function was why she is having urinary frequency. I went over her lab results with her from 04/2016 and offered to recheck renal function but she declined at this time. She is to call if symptoms worsen.  - POCT urinalysis dipstick - Ambulatory referral to Urology  2. Nocturnal enuresis See above medical treatment plan. - Ambulatory referral to Urology  3. Functional urinary incontinence See above medical treatment plan. - Ambulatory referral to Urology       Margaretann Loveless, PA-C  [facility]

## 2016-09-13 NOTE — Patient Instructions (Signed)
Urinary Frequency, Adult Urinary frequency means urinating more often than usual. People with urinary frequency urinate at least 8 times in 24 hours, even if they drink a normal amount of fluid. Although they urinate more often than normal, the total amount of urine produced in a day may be normal. Urinary frequency is also called pollakiuria. What are the causes? This condition may be caused by:  A urinary tract infection.  Obesity.  Bladder problems, such as bladder stones.  Caffeine or alcohol.  Eating food or drinking fluids that irritate the bladder. These include coffee, tea, soda, artificial sweeteners, citrus, tomato-based foods, and chocolate.  Certain medicines, such as medicines that help the body get rid of extra fluid (diuretics).  Muscle or nerve weakness.  Overactive bladder.  Chronic diabetes.  Interstitial cystitis.  In men, problems with the prostate, such as an enlarged prostate.  In women, pregnancy. In some cases, the cause may not be known. What increases the risk? This condition is more likely to develop in:  Women who have gone through menopause.  Men with prostate problems.  People with a disease or injury that affects the nerves or spinal cord.  People who have or have had a condition that affects the brain, such as a stroke. What are the signs or symptoms? Symptoms of this condition include:  Feeling an urgent need to urinate often. The stress and anxiety of needing to find a bathroom quickly can make this urge worse.  Urinating 8 or more times in 24 hours.  Urinating as often as every 1 to 2 hours. How is this diagnosed? This condition is diagnosed based on your symptoms, your medical history, and a physical exam. You may have tests, such as:  Blood tests.  Urine tests.  Imaging tests, such as X-rays or ultrasounds.  A bladder test.  A test of your neurological system. This is the body system that senses the need to urinate.  A  test to check for problems in the urethra and bladder called cystoscopy. You may also be asked to keep a bladder diary. A bladder diary is a record of what you eat and drink, how often you urinate, and how much you urinate. You may need to see a health care provider who specializes in conditions of the urinary tract (urologist) or kidneys (nephrologist). How is this treated? Treatment for this condition depends on the cause. Sometimes the condition goes away on its own and treatment is not necessary. If treatment is needed, it may include:  Taking medicine.  Learning exercises that strengthen the muscles that help control urination.  Following a bladder training program. This may include:  Learning to delay going to the bathroom.  Double urinating (voiding). This helps if you are not completely emptying your bladder.  Scheduled voiding.  Making diet changes, such as:  Avoiding caffeine.  Drinking fewer fluids, especially alcohol.  Not drinking in the evening.  Not having foods or drinks that may irritate the bladder.  Eating foods that help prevent or ease constipation. Constipation can make this condition worse.  Having the nerves in your bladder stimulated. There are two options for stimulating the nerves to your bladder:  Outpatient electrical nerve stimulation. This is done by your health care provider.  Surgery to implant a bladder pacemaker. The pacemaker helps to control the urge to urinate. Follow these instructions at home:  Keep a bladder diary if told to by your health care provider.  Take over-the-counter and prescription medicines only as   told by your health care provider.  Do any exercises as told by your health care provider.  Follow a bladder training program as told by your health care provider.  Make any recommended diet changes.  Keep all follow-up visits as told by your health care provider. This is important. Contact a health care provider  if:  You start urinating more often.  You feel pain or irritation when you urinate.  You notice blood in your urine.  Your urine looks cloudy.  You develop a fever.  You begin vomiting. Get help right away if:  You are unable to urinate. This information is not intended to replace advice given to you by your health care provider. Make sure you discuss any questions you have with your health care provider. Document Released: 03/16/2009 Document Revised: 06/21/2015 Document Reviewed: 12/14/2014 Elsevier Interactive Patient Education  2017 [employer]  

## 2016-10-01 ENCOUNTER — Ambulatory Visit: Payer: Medicare Other | Admitting: Urology

## 2016-10-07 ENCOUNTER — Ambulatory Visit (INDEPENDENT_AMBULATORY_CARE_PROVIDER_SITE_OTHER): Payer: Medicare Other | Admitting: Podiatry

## 2016-10-07 DIAGNOSIS — B351 Tinea unguium: Secondary | ICD-10-CM | POA: Diagnosis not present

## 2016-10-07 DIAGNOSIS — M79676 Pain in unspecified toe(s): Secondary | ICD-10-CM

## 2016-10-07 NOTE — Progress Notes (Signed)
Complaint:  Visit Type: Patient returns to my office for continued preventative foot care services. Complaint: Patient states" my nails have grown long and thick and become painful to walk and wear shoes" Patient has been diagnosed with DM with no foot complications. The patient presents for preventative foot care services. No changes to ROS  Podiatric Exam: Vascular: dorsalis pedis  are palpable bilateral. Posterior tibial pulses are absent secondary severe leg and ankle swelling. Capillary return is immediate. Temperature gradient is WNL. Skin turgor WNL  Sensorium: Normal Semmes Weinstein monofilament test. Normal tactile sensation bilaterally. Nail Exam: Pt has thick disfigured discolored nails with subungual debris noted bilateral entire nail hallux through fifth toenails Ulcer Exam: There is no evidence of ulcer or pre-ulcerative changes or infection. Orthopedic Exam: Muscle tone and strength are WNL. No limitations in general ROM. No crepitus or effusions noted. Foot type and digits show no abnormalities. Bony prominences are unremarkable. Skin: No Porokeratosis. No infection or ulcers  Diagnosis:  Onychomycosis, , Pain in right toe, pain in left toes  Treatment & Plan Procedures and Treatment: Consent by patient was obtained for treatment procedures. The patient understood the discussion of treatment and procedures well. All questions were answered thoroughly reviewed. Debridement of mycotic and hypertrophic toenails, 1 through 5 bilateral and clearing of subungual debris. No ulceration, no infection noted.  Patient was instructed to use pillows at bedtime for heel pain. Return Visit-Office Procedure: Patient instructed to return to the office for a follow up visit 3 months for continued evaluation and treatment.    Doc Mandala DPM 

## 2016-10-08 DIAGNOSIS — I890 Lymphedema, not elsewhere classified: Secondary | ICD-10-CM | POA: Diagnosis not present

## 2016-10-14 NOTE — Progress Notes (Signed)
NEUROPSYCHOLOGICAL EVALUATION   Name:    Sherry Brooks  Date of Birth:   09-Dec-1928 Date of Interview:  08/26/2016 Date of Testing:  09/09/2016   Date of Feedback:  10/15/2016       Background Information:  Reason for Referral:  Sherry Brooks is a 83 y.o. female referred by Fenton Malling of [facility] to assess her current level of cognitive functioning and assist in differential diagnosis. The current evaluation consisted of a review of available medical records, an interview with the patient and her daughter in law, Sherry Brooks, and the completion of a neuropsychological testing battery. Informed consent was obtained.  History of Presenting Problem:  Sherry Brooks endorsed concerns about her memory, stating that she cannot remember names of people, especially in the morning. She endorsed some forgetfulness for recent conversations. Her daughter in law reported forgetfulness for recent conversations and events, frequent repetition of questions, and a tendency to say something totally out of context in the middle of a conversation. She also has been having visual hallucinations of children in her yard. She reported that it bothers her that the children are not in school but otherwise she is not upset by seeing them there.   Sherry Brooks continues to live alone although she does have caregivers for several hours a day 6 days a week. Her daughter in law also visits her 2-3 times a week and takes her to doctor's appointments and other errands. The patient reported that she cannot do much anymore because of vision loss secondary to macular degeneration. She has not driven in 4-5 years. Sherry Brooks fills her pillboxes and the caregivers oversee the patient's self-administration. Sherry Brooks manages her bills and finances. Sherry Brooks manages her appointments. The caregivers do the cooking and laundry. A housekeeper comes every two weeks. The patient showers independently and dresses  independently. According to records reviewed, the patient is having urinary incontinence.  Physically, she endorses back pain and knee pain. She uses a rollator which she calls her "[place]". She has had several falls. She fell three times at the end of 2017. She did not sustain any injuries or LOC. One time, she fell at home in the evening and did not want to bother anyone so she stayed on the floor overnight until her caregiver arrived in the morning. She does have a fall alert necklace but it does not appear that she wears it regularly.   There is no family history of dementia that the patient is aware of, but she notes that most of her family stayed in [place]. (She moved from [place] to the [place] at age 52 with her husband.)  With regard to mood, the patient reports that she is sad much of the time. She endorses feeling lonely although she is not physically alone that often, and she typically does not want to leave her home for social outings. She has some friends who come visit her and talk to her on the phone. She sees her daughter in law regularly.   The patient reported a lifelong history of anxiety characterized by worrying excessively. She takes Xanax once a day every day. She is also prescribed Wellbutrin. She denied any difficulty with sleeping. She takes an afternoon nap. She denies any problem with appetite.   Of note, Sherry Brooks has been talking a lot about death and wanting to join her husband. She says she has "had enough" and doesn't want to be here anymore. However, she denies suicidal intention, stating  that it is "against God's law".    Social History: Born/Raised: [place]. The patient completed the equivalent of a high school education in [place]. The patient immigrated to the [place] in 28 at age 84 with her husband. She did not speak English at the time. She and her husband lived in [place] for 9 years. Her husband was transferred to [facility] and she has lived here ever since.  She previously worked as a Regulatory affairs officer. She and her husband were married 5 years. He passed away 12 years ago. They had two children, a son and a daughter. Her son is now deceased. She has been estranged from her daughter for many years. The patient has six grandchildren.  Alcohol/Tobacco/Substances: Occasional glass of wine, was never a smoker. No SA.   Medical History:  Past Medical History:  Diagnosis Date  . Anxiety   . Arthritis    knees  . Borderline diabetic   . Complication of anesthesia    wakes up slowly, takes longer than usual to wake  . Depression   . Diabetes ([place])   . GERD (gastroesophageal reflux disease)    on occas. uses TUMS for indigestion   . Heart murmur   . History of stress test    10 yrs. ago, states it was normal , had been ordered for pt. due to stress related to husband's illness,, by Dr. Edison Nasuti  . Hyperlipemia   . Hypertension   . Macular degeneration   . Peripheral vascular disease ([place])    blood clots in legs mid 2000's   . Renal calculus    history of > 50 yrs. ago    Current medications:  Outpatient Encounter Prescriptions as of 10/15/2016  Medication Sig  . ALPRAZolam (XANAX) 0.5 MG tablet TAKE 1/2 TO 1 TABLET BY MOUTH TWICE DAILY AS NEEDED FOR ANXIETY  . ARTIFICIAL TEAR SOLUTION OP Apply to eye.  . B Complex Vitamins (VITAMIN B COMPLEX PO)   . buPROPion (WELLBUTRIN) 75 MG tablet Take 1 tablet (75 mg total) by mouth 2 (two) times daily. (Patient taking differently: Take 75 mg by mouth daily. )  . calcium carbonate (TUMS EX) 750 MG chewable tablet Chew 1 tablet by mouth daily as needed for heartburn.  . Calcium Carbonate-Vitamin D (CALCIUM-VITAMIN D) 600-125 MG-UNIT TABS Take by mouth.  . furosemide (LASIX) 20 MG tablet TAKE 1 TABLET(20 MG) BY MOUTH DAILY AS NEEDED FOR SWELLING (Patient not taking: Reported on 09/13/2016)  . lisinopril-hydrochlorothiazide (PRINZIDE,ZESTORETIC) 20-25 MG tablet TAKE 1 TABLET BY MOUTH EVERY DAY.  . Multiple  Vitamins-Minerals (PRESERVISION AREDS) CAPS Take by mouth.  . Olopatadine HCl (PATADAY) 0.2 % SOLN Apply to eye.  . pravastatin (PRAVACHOL) 10 MG tablet TAKE 1 TABLET BY MOUTH EVERY NIGHT AT BEDTIME.  Marland Kitchen Psyllium (METAMUCIL PO) Take 1 capsule by mouth daily.  Marland Kitchen VITAMIN E PO Take 1 tablet by mouth daily.   No facility-administered encounter medications on file as of 10/15/2016.      Current Examination:  Behavioral Observations:   Appearance: Neatly and appropriately dressed and groomed Gait: Ambulated with rollater, slow gait, poor balance Speech: Fluent; normal rate, rhythm and volume Thought process: Linear, goal directed Affect: Blunted, appropriate, dysthymic but brightens occasionally Interpersonal: Pleasant, appropriate Orientation: Oriented to person, place, month and day of the week. Disoriented to date and year. Accurately named the current President and his predecessor.  Tests Administered: . Test of Premorbid Functioning (TOPF) - large font . Wechsler Adult Intelligence Scale-Fourth Edition (WAIS-IV): Similarities  and Digit Span subtests . Repeatable Battery for the Assessment of Neuropsychological Status (RBANS) Form A:  List Learning/Recall and Recognition subtests, Story Memory and Story Recall subtests, Figure Copy and Figure Recall subtests, Semantic Fluency subtest, and Coding subtest . Neuropsychological Assessment Battery (NAB) Language Module, Form 1: Naming subtest . Boston Diagnostic Aphasia Examination: Complex Ideational Material subtest . Controlled Oral Word Association Test (COWAT) . Trail Making Test A and B . Clock drawing test . Geriatric Depression Scale (GDS) 15 Item . Generalized Anxiety Disorder - 7 item screener (GAD-7) . AD8 Dementia Screening Interview  Test Results: Note: Standardized scores are presented only for use by appropriately trained professionals and to allow for any future test-retest comparison. These scores should not be interpreted  without consideration of all the information that is contained in the rest of the report. The most recent standardization samples from the test publisher or other sources were used whenever possible to derive standard scores; scores were corrected for age, gender, ethnicity and education when available.  Several tests were unable to be administered due to the patient's significant vision loss.  Test Scores:  Test Name Raw Score Standardized Score Descriptor  TOPF   Pt unable due to poor vision  WAIS-IV Subtests     Similarities 12/36 ss= 6 Low average  Digit Span Forward 9/16 ss= 10 Average  Digit Span Backward 5/16 ss= 7 Low average  RBANS Subtests     List Learning 11/40 Z= -2.7 Severely impaired  List Recall 0/10 Z= -1.7 Borderline  List Recognition 15/20 Z= -3.4 Severely impaired  Story Memory 5/24 Z= -2.6 Severely impaired  Story Recall 1/12 Z= -2.3 Impaired  Figure Copy   Pt unable due to poor vision  Figure Recall   Pt unable due to poor vision  Semantic Fluency 9/40 Z= -2.3 Impaired  Coding   Pt unable due to poor vision  NAB Language subtests     Naming 13/31 T= 19 Severely impaired  BDAE Subtest     Complex Ideational Material 8/12  Impaired  COWAT-FAS 7 T= 30 Impaired  COWAT-Animals 7 T= 30 Impaired  Trail Making Test A - Oral 15" 0 errors     Trail Making Test B - Oral  Pt unable - 1st error at B - pt requested to stop    Clock Drawing   Impaired  GDS-15 9/15  Moderate  GAD-7 7/21  Mild  AD8 7/8  Consistent with dementia      Description of Test Results:  Psychomotor processing speed was unable to be assessed formally due to her poor vision/ inability to see testing stimuli. Auditory attention and working memory were average to low average, respectively. Visual-spatial construction was unable to be assessed due to poor vision/inability to see testing stimuli. Language abilities were impaired. Specifically, confrontation naming was severely impaired, and semantic  verbal fluency was impaired. Auditory comprehension of complex ideational material was impaired. With regard to verbal memory, encoding and acquisition of non-contextual information (i.e., word list) was severely impaired. After a delay, she did not recall any of the original items. Performance on a yes/no recognition task was impaired. On another verbal memory test, encoding and acquisition of contextual auditory information (i.e., short story) was severely impaired. After a delay, free recall was impaired. Non-verbal memory was unable to be formally assessed due to her poor vision. Executive functioning was mostly impaired. Mental flexibility and set-shifting were severely impaired; she was unable to complete an oral version of Trails B. Verbal  fluency with phonemic search restrictions was impaired. Verbal abstract reasoning was low average. Performance on a clock drawing task was impaired. On self-report questionnaires, the patient's responses were indicative of mild anxiety and more significant depression at the present time. Symptoms endorsed included: dropping interests/activities, feeling life is empty, not feeling in good spirits, fear of something bad happening, helplessness, prefering to stay home, feelings of worthlessness, lack of energy. She also endorsed nervousness, inability to control worry, excessive worry, and fear of the worst happening.   Clinical Impressions: Moderate dementia, most likely secondary to Alzheimer's disease, with behavioral disturbance (visual hallucinations and depression/anxiety). Results of the current cognitive evaluation reveal several areas of impairment, including verbal and non-verbal memory, semantic retrieval (i.e., verbal fluency and confrontation naming), and aspects of executive functioning. Additionally, there is evidence that her cognitive deficits are interfering with her ability to manage complex tasks, such as managing finances, medications, appointments and  cooking. As such, diagnostic criteria for a dementia syndrome are met.   The patient's cognitive profile is suggestive of medial-temporal lobe involvement. Alzheimer's disease is the most likely etiology, given her cognitive profile, clinical features and age. Based on the level of impairment on testing and in daily functioning, as well as the neuropsychiatric features (hallucinations), her dementia is characterized as moderate stage. She also has a longstanding history of anxiety, and more recent onset of depression which is likely associated with her dementia.     Recommendations: Based on the findings of the present evaluation, the following recommendations are offered:  1. The patient may be an appropriate candidate for cholinesterase inhibitor therapy. She can follow up with her PCP about this. 2. The patient should continue to receive assistance with all complex ADLs, including transportation, finances, cooking, appointments and medications. She is no longer driving. She has caregiver services in the home.  3. Education regarding Alzheimer's disease was provided to the patient and her daughter in law. Written information was given. Additional information and resources are available from the Alzheimer's Association ([place].co.nz).  4. The patient should continue to participate in activities which provide social interaction. 5. She has a fall alert necklace but does not seem to be wearing it consistently. Perhaps her caregivers could make sure she has it on when they leave in the afternoon. 6. Consideration of a change in psychiatric medications is warranted. She has been taking Xanax daily for a number of years, and this may be exacerbating cognitive decline. Additionally, she is prescribed Wellbutrin but she has a history of significant anxiety, and Wellbutrin often increases anxiety. I wonder if starting her on an SSRI and tapering her Xanax would be prudent; I will defer to her primary care  physician. She may benefit from referral to geriatric psychiatry.   Feedback to Patient: Sherry Brooks and her daughter in law Sherry Brooks returned for a feedback appointment on 10/15/2016 to review the results of her neuropsychological evaluation with this provider. 25 minutes face-to-face time was spent reviewing her test results, my impressions and my recommendations as detailed above.    Total time spent on this patient's case: [id number] unit for interview with psychologist; [phone] units of testing by psychometrician under psychologist's supervision; [phone] units for medical record review, scoring of neuropsychological tests, interpretation of test results, preparation of this report, and review of results to the patient by psychologist.      Thank you for your referral of Sherry Brooks. Please feel free to contact me if you have any questions or concerns regarding  this report.

## 2016-10-15 ENCOUNTER — Ambulatory Visit (INDEPENDENT_AMBULATORY_CARE_PROVIDER_SITE_OTHER): Payer: Medicare Other | Admitting: Psychology

## 2016-10-15 ENCOUNTER — Encounter: Payer: Self-pay | Admitting: Psychology

## 2016-10-15 DIAGNOSIS — F0281 Dementia in other diseases classified elsewhere with behavioral disturbance: Secondary | ICD-10-CM

## 2016-10-15 DIAGNOSIS — F02818 Dementia in other diseases classified elsewhere, unspecified severity, with other behavioral disturbance: Secondary | ICD-10-CM

## 2016-10-15 DIAGNOSIS — G301 Alzheimer's disease with late onset: Secondary | ICD-10-CM | POA: Diagnosis not present

## 2016-10-15 NOTE — Patient Instructions (Signed)
Based on the findings of the present evaluation, the following recommendations are offered:  1. The patient may be an appropriate candidate for cholinesterase inhibitor therapy. She can follow up with her PCP about this. 2. The patient should continue to receive assistance with all complex ADLs, including transportation, finances, cooking, appointments and medications. She is no longer driving. She has caregiver services in the home.  3. Education regarding Alzheimer's disease was provided to the patient and her daughter in law. Written information was given. Additional information and resources are available from the Alzheimer's Association (LimitLaws.huwww.alz.org).  4. The patient should continue to participate in activities which provide social interaction. 5. She has a fall alert necklace but does not seem to be wearing it consistently. Perhaps her caregivers could make sure she has it on when they leave in the afternoon. 6. Consideration of a change in psychiatric medications is warranted. She has been taking Xanax daily for a number of years, and this may be exacerbating cognitive decline. Additionally, she is prescribed Wellbutrin but she has a history of significant anxiety, and Wellbutrin often increases anxiety. I wonder if starting her on an SSRI and tapering her Xanax would be prudent; I will defer to her primary care physician. She may benefit from referral to geriatric psychiatry.

## 2016-10-17 ENCOUNTER — Ambulatory Visit (INDEPENDENT_AMBULATORY_CARE_PROVIDER_SITE_OTHER): Payer: Medicare Other | Admitting: Physician Assistant

## 2016-10-17 ENCOUNTER — Encounter: Payer: Self-pay | Admitting: Physician Assistant

## 2016-10-17 VITALS — BP 140/80 | HR 73 | Temp 98.1°F | Resp 16 | Wt 217.0 lb

## 2016-10-17 DIAGNOSIS — F0280 Dementia in other diseases classified elsewhere without behavioral disturbance: Secondary | ICD-10-CM | POA: Diagnosis not present

## 2016-10-17 DIAGNOSIS — F321 Major depressive disorder, single episode, moderate: Secondary | ICD-10-CM

## 2016-10-17 DIAGNOSIS — G301 Alzheimer's disease with late onset: Secondary | ICD-10-CM | POA: Diagnosis not present

## 2016-10-17 MED ORDER — DONEPEZIL HCL 5 MG PO TABS: 5.0000 mg | ORAL_TABLET | Freq: Every day | ORAL | 0 refills | Status: DC

## 2016-10-17 MED ORDER — SERTRALINE HCL 25 MG PO TABS: 25.0000 mg | ORAL_TABLET | Freq: Every day | ORAL | 0 refills | Status: DC

## 2016-10-17 NOTE — Progress Notes (Signed)
Patient: Sherry Brooks Female    DOB: 1928-09-16   83 y.o.   MRN: [id number] Visit Date: 10/17/2016  Today's Provider: Margaretann Loveless, PA-C   Chief Complaint  Patient presents with  . Follow-up    [place] appointment   Subjective:   Patient here with her daughter in law Sherry Brooks.  HPI Patient here today follow-up Neuro visit. Patient saw Dr. Dimas Chyle, 3 days ago. Dr.Bailar- Vilma Prader made the following recommendations, Pt may be a candidate for Cholinesterase inhibitor therapy-follow up with PCP for this. Patient and daughter in law were provided with written information for Alzheimer's Disease. Consider change in psychiatric medication. Pt has been taking Xanax daily for several years, Xanax may be exacerbating cognitive decline. Since patient is prescribed Wellbutrin and has a history of significant anxiety and Wellbutrin often increases Anxiety. Dr. Dimas Chyle wonders if starting pt on an SSRI and taper off Xanax might help.Recommends that pt may benefit from referral to geriatric psychiatry.     No Known Allergies   Current Outpatient Prescriptions:  .  ALPRAZolam (XANAX) 0.5 MG tablet, TAKE 1/2 TO 1 TABLET BY MOUTH TWICE DAILY AS NEEDED FOR ANXIETY, Disp: 60 tablet, Rfl: 5 .  ARTIFICIAL TEAR SOLUTION OP, Apply to eye., Disp: , Rfl:  .  B Complex Vitamins (VITAMIN B COMPLEX PO), , Disp: , Rfl:  .  buPROPion (WELLBUTRIN) 75 MG tablet, Take 1 tablet (75 mg total) by mouth 2 (two) times daily. (Patient taking differently: Take 75 mg by mouth daily. ), Disp: 180 tablet, Rfl: 1 .  calcium carbonate (TUMS EX) 750 MG chewable tablet, Chew 1 tablet by mouth daily as needed for heartburn., Disp: , Rfl:  .  Calcium Carbonate-Vitamin D (CALCIUM-VITAMIN D) 600-125 MG-UNIT TABS, Take by mouth., Disp: , Rfl:  .  lisinopril-hydrochlorothiazide (PRINZIDE,ZESTORETIC) 20-25 MG tablet, TAKE 1 TABLET BY MOUTH EVERY DAY., Disp: 90 tablet, Rfl: 3 .  Multiple Vitamins-Minerals  (PRESERVISION AREDS) CAPS, Take by mouth., Disp: , Rfl:  .  Olopatadine HCl (PATADAY) 0.2 % SOLN, Apply to eye., Disp: , Rfl:  .  pravastatin (PRAVACHOL) 10 MG tablet, TAKE 1 TABLET BY MOUTH EVERY NIGHT AT BEDTIME., Disp: 90 tablet, Rfl: 1 .  Psyllium (METAMUCIL PO), Take 1 capsule by mouth daily., Disp: , Rfl:  .  VITAMIN E PO, Take 1 tablet by mouth daily., Disp: , Rfl:  .  furosemide (LASIX) 20 MG tablet, TAKE 1 TABLET(20 MG) BY MOUTH DAILY AS NEEDED FOR SWELLING (Patient not taking: Reported on 09/13/2016), Disp: 30 tablet, Rfl: 1  Review of Systems  Constitutional: Positive for fatigue.  Respiratory: Negative for cough, chest tightness and shortness of breath.   Cardiovascular: Negative for chest pain, palpitations and leg swelling.  Gastrointestinal: Negative for abdominal pain.  Neurological: Negative for dizziness, weakness, numbness and headaches.  Psychiatric/Behavioral: Positive for decreased concentration (a little), dysphoric mood and sleep disturbance. Negative for agitation, behavioral problems, confusion, hallucinations, self-injury and suicidal ideas. The patient is nervous/anxious. The patient is not hyperactive.     Social History  Substance Use Topics  . Smoking status: Never Smoker  . Smokeless tobacco: Never Used  . Alcohol use Yes     Comment: glass of wine, rare occas.    Objective:   BP 140/80 (BP Location: Right Arm, Patient Position: Sitting, Cuff Size: Normal)   Pulse 73   Temp 98.1 F (36.7 C) (Oral)   Resp 16   Wt 217 lb (98.4 kg)  SpO2 98%   BMI 37.25 kg/m    Physical Exam  Constitutional: She appears well-developed and well-nourished. No distress.  Neck: Normal range of motion. Neck supple.  Cardiovascular: Normal rate, regular rhythm and normal heart sounds.  Exam reveals no gallop and no friction rub.   No murmur heard. Pulmonary/Chest: Effort normal and breath sounds normal. No respiratory distress. She has no wheezes. She has no rales.    Skin: She is not diaphoretic.  Psychiatric: Her speech is normal and behavior is normal. Judgment normal. Cognition and memory are impaired. She exhibits a depressed mood. She expresses no suicidal plans and no homicidal plans.  Vitals reviewed.     Assessment & Plan:     1. Depression, major, single episode, moderate (HCC) Will change wellbutrin to zoloft as below. She is going to discontinue xanax. She is taking such a low dose anyways thus it is probably not doing much. I will see her back in 4 weeks to see how she is tolerating this change and titrate up if needed.  - sertraline (ZOLOFT) 25 MG tablet; Take 1 tablet (25 mg total) by mouth at bedtime. May increase to 2 tabs PO q hs after 2 weeks  Dispense: 60 tablet; Refill: 0  2. Late onset Alzheimer's disease without behavioral disturbance Added for worsening dementia. I will see her back in 4 weeks and titrate to 10mg  if tolerating. May consider adding Namenda or switching to Namzaric if tolerating well.  - donepezil (ARICEPT) 5 MG tablet; Take 1 tablet (5 mg total) by mouth at bedtime.  Dispense: 30 tablet; Refill: 0       Margaretann LovelessJennifer M Tayleigh Wetherell, PA-C  [facility]

## 2016-10-17 NOTE — Patient Instructions (Signed)
Sertraline tablets What is this medicine? SERTRALINE (SER tra leen) is used to treat depression. It may also be used to treat obsessive compulsive disorder, panic disorder, post-trauma stress, premenstrual dysphoric disorder (PMDD) or social anxiety. This medicine may be used for other purposes; ask your health care provider or pharmacist if you have questions. COMMON BRAND NAME(S): Zoloft What should I tell my health care provider before I take this medicine? They need to know if you have any of these conditions: -bleeding disorders -bipolar disorder or a family history of bipolar disorder -glaucoma -heart disease -high blood pressure -history of irregular heartbeat -history of low levels of calcium, magnesium, or potassium in the blood -if you often drink alcohol -liver disease -receiving electroconvulsive therapy -seizures -suicidal thoughts, plans, or attempt; a previous suicide attempt by you or a family member -take medicines that treat or prevent blood clots -thyroid disease -an unusual or allergic reaction to sertraline, other medicines, foods, dyes, or preservatives -pregnant or trying to get pregnant -breast-feeding How should I use this medicine? Take this medicine by mouth with a glass of water. Follow the directions on the prescription label. You can take it with or without food. Take your medicine at regular intervals. Do not take your medicine more often than directed. Do not stop taking this medicine suddenly except upon the advice of your doctor. Stopping this medicine too quickly may cause serious side effects or your condition may worsen. A special MedGuide will be given to you by the pharmacist with each prescription and refill. Be sure to read this information carefully each time. Talk to your pediatrician regarding the use of this medicine in children. While this drug may be prescribed for children as young as 7 years for selected conditions, precautions do  apply. Overdosage: If you think you have taken too much of this medicine contact a poison control center or emergency room at once. NOTE: This medicine is only for you. Do not share this medicine with others. What if I miss a dose? If you miss a dose, take it as soon as you can. If it is almost time for your next dose, take only that dose. Do not take double or extra doses. What may interact with this medicine? Do not take this medicine with any of the following medications: -cisapride -dofetilide -dronedarone -linezolid -MAOIs like Carbex, Eldepryl, Marplan, Nardil, and Parnate -methylene blue (injected into a vein) -pimozide -thioridazine This medicine may also interact with the following medications: -alcohol -amphetamines -aspirin and aspirin-like medicines -certain medicines for depression, anxiety, or psychotic disturbances -certain medicines for fungal infections like ketoconazole, fluconazole, posaconazole, and itraconazole -certain medicines for irregular heart beat like flecainide, quinidine, propafenone -certain medicines for migraine headaches like almotriptan, eletriptan, frovatriptan, naratriptan, rizatriptan, sumatriptan, zolmitriptan -certain medicines for sleep -certain medicines for seizures like carbamazepine, valproic acid, phenytoin -certain medicines that treat or prevent blood clots like warfarin, enoxaparin, dalteparin -cimetidine -digoxin -diuretics -fentanyl -isoniazid -lithium -NSAIDs, medicines for pain and inflammation, like ibuprofen or naproxen -other medicines that prolong the QT interval (cause an abnormal heart rhythm) -rasagiline -safinamide -supplements like St. John's wort, kava kava, valerian -tolbutamide -tramadol -tryptophan This list may not describe all possible interactions. Give your health care provider a list of all the medicines, herbs, non-prescription drugs, or dietary supplements you use. Also tell them if you smoke, drink  alcohol, or use illegal drugs. Some items may interact with your medicine. What should I watch for while using this medicine? Tell your doctor if your symptoms   do not get better or if they get worse. Visit your doctor or health care professional for regular checks on your progress. Because it may take several weeks to see the full effects of this medicine, it is important to continue your treatment as prescribed by your doctor. Patients and their families should watch out for new or worsening thoughts of suicide or depression. Also watch out for sudden changes in feelings such as feeling anxious, agitated, panicky, irritable, hostile, aggressive, impulsive, severely restless, overly excited and hyperactive, or not being able to sleep. If this happens, especially at the beginning of treatment or after a change in dose, call your health care professional. Bonita Quin may get drowsy or dizzy. Do not drive, use machinery, or do anything that needs mental alertness until you know how this medicine affects you. Do not stand or sit up quickly, especially if you are an older patient. This reduces the risk of dizzy or fainting spells. Alcohol may interfere with the effect of this medicine. Avoid alcoholic drinks. Your mouth may get dry. Chewing sugarless gum or sucking hard candy, and drinking plenty of water may help. Contact your doctor if the problem does not go away or is severe. What side effects may I notice from receiving this medicine? Side effects that you should report to your doctor or health care professional as soon as possible: -allergic reactions like skin rash, itching or hives, swelling of the face, lips, or tongue -anxious -black, tarry stools -changes in vision -confusion -elevated mood, decreased need for sleep, racing thoughts, impulsive behavior -eye pain -fast, irregular heartbeat -feeling faint or lightheaded, falls -feeling agitated, angry, or irritable -hallucination, loss of contact with  reality -loss of balance or coordination -loss of memory -painful or prolonged erections -restlessness, pacing, inability to keep still -seizures -stiff muscles -suicidal thoughts or other mood changes -trouble sleeping -unusual bleeding or bruising -unusually weak or tired -vomiting Side effects that usually do not require medical attention (report to your doctor or health care professional if they continue or are bothersome): -change in appetite or weight -change in sex drive or performance -diarrhea -increased sweating -indigestion, nausea -tremors This list may not describe all possible side effects. Call your doctor for medical advice about side effects. You may report side effects to FDA at 1-800-FDA-1088. Where should I keep my medicine? Keep out of the reach of children. Store at room temperature between 15 and 30 degrees C (59 and 86 degrees F). Throw away any unused medicine after the expiration date. NOTE: This sheet is a summary. It may not cover all possible information. If you have questions about this medicine, talk to your doctor, pharmacist, or health care provider.  2018 Elsevier/Gold Standard (2016-05-24 14:17:49) Donepezil tablets What is this medicine? DONEPEZIL (doe NEP e zil) is used to treat mild to moderate dementia caused by Alzheimer's disease. This medicine may be used for other purposes; ask your health care provider or pharmacist if you have questions. COMMON BRAND NAME(S): Aricept What should I tell my health care provider before I take this medicine? They need to know if you have any of these conditions: -asthma or other lung disease -difficulty passing urine -head injury -heart disease -history of irregular heartbeat -liver disease -seizures (convulsions) -stomach or intestinal disease, ulcers or stomach bleeding -an unusual or allergic reaction to donepezil, other medicines, foods, dyes, or preservatives -pregnant or trying to get  pregnant -breast-feeding How should I use this medicine? Take this medicine by mouth with a glass of  water. Follow the directions on the prescription label. You may take this medicine with or without food. Take this medicine at regular intervals. This medicine is usually taken before bedtime. Do not take it more often than directed. Continue to take your medicine even if you feel better. Do not stop taking except on your doctor's advice. If you are taking the 23 mg donepezil tablet, swallow it whole; do not cut, crush, or chew it. Talk to your pediatrician regarding the use of this medicine in children. Special care may be needed. Overdosage: If you think you have taken too much of this medicine contact a poison control center or emergency room at once. NOTE: This medicine is only for you. Do not share this medicine with others. What if I miss a dose? If you miss a dose, take it as soon as you can. If it is almost time for your next dose, take only that dose, do not take double or extra doses. What may interact with this medicine? Do not take this medicine with any of the following medications: -certain medicines for fungal infections like itraconazole, fluconazole, posaconazole, and voriconazole -cisapride -dextromethorphan; quinidine -dofetilide -dronedarone -pimozide -quinidine -thioridazine -ziprasidone This medicine may also interact with the following medications: -antihistamines for allergy, cough and cold -atropine -bethanechol -carbamazepine -certain medicines for bladder problems like oxybutynin, tolterodine -certain medicines for Parkinson's disease like benztropine, trihexyphenidyl -certain medicines for stomach problems like dicyclomine, hyoscyamine -certain medicines for travel sickness like scopolamine -dexamethasone -ipratropium -NSAIDs, medicines for pain and inflammation, like ibuprofen or naproxen -other medicines for Alzheimer's disease -other medicines that  prolong the QT interval (cause an abnormal heart rhythm) -phenobarbital -phenytoin -rifampin, rifabutin or rifapentine This list may not describe all possible interactions. Give your health care provider a list of all the medicines, herbs, non-prescription drugs, or dietary supplements you use. Also tell them if you smoke, drink alcohol, or use illegal drugs. Some items may interact with your medicine. What should I watch for while using this medicine? Visit your doctor or health care professional for regular checks on your progress. Check with your doctor or health care professional if your symptoms do not get better or if they get worse. You may get drowsy or dizzy. Do not drive, use machinery, or do anything that needs mental alertness until you know how this drug affects you. What side effects may I notice from receiving this medicine? Side effects that you should report to your doctor or health care professional as soon as possible: -allergic reactions like skin rash, itching or hives, swelling of the face, lips, or tongue -feeling faint or lightheaded, falls -loss of bladder control -seizures -signs and symptoms of a dangerous change in heartbeat or heart rhythm like chest pain; dizziness; fast or irregular heartbeat; palpitations; feeling faint or lightheaded, falls; breathing problems -signs and symptoms of infection like fever or chills; cough; sore throat; pain or trouble passing urine -signs and symptoms of liver injury like dark yellow or brown urine; general ill feeling or flu-like symptoms; light-colored stools; loss of appetite; nausea; right upper belly pain; unusually weak or tired; yellowing of the eyes or skin -slow heartbeat or palpitations -unusual bleeding or bruising -vomiting Side effects that usually do not require medical attention (report to your doctor or health care professional if they continue or are bothersome): -diarrhea, especially when starting  treatment -headache -loss of appetite -muscle cramps -nausea -stomach upset This list may not describe all possible side effects. Call your doctor for medical advice  about side effects. You may report side effects to FDA at 1-800-FDA-1088. Where should I keep my medicine? Keep out of reach of children. Store at room temperature between 15 and 30 degrees C (59 and 86 degrees F). Throw away any unused medicine after the expiration date. NOTE: This sheet is a summary. It may not cover all possible information. If you have questions about this medicine, talk to your doctor, pharmacist, or health care provider.  2018 Elsevier/Gold Standard (2015-11-06 21:00:42)

## 2016-10-30 ENCOUNTER — Telehealth: Payer: Self-pay | Admitting: Physician Assistant

## 2016-10-30 NOTE — Telephone Encounter (Signed)
I am glad to hear she is tolerating well. Continue her current dosing until I see her back on 11/15/16

## 2016-10-30 NOTE — Telephone Encounter (Signed)
Advised daughter as below.  

## 2016-10-30 NOTE — Telephone Encounter (Signed)
Pt daughter-in-law states pt was put on 2 new medications and was told if they are working well then the dosage will need to be increased.  She is not sure which of the medication that was going to be increased.  [facility]  [phone]

## 2016-11-08 DIAGNOSIS — I890 Lymphedema, not elsewhere classified: Secondary | ICD-10-CM | POA: Diagnosis not present

## 2016-11-15 ENCOUNTER — Ambulatory Visit (INDEPENDENT_AMBULATORY_CARE_PROVIDER_SITE_OTHER): Payer: Medicare Other | Admitting: Physician Assistant

## 2016-11-15 ENCOUNTER — Encounter: Payer: Self-pay | Admitting: Physician Assistant

## 2016-11-15 VITALS — BP 120/70 | HR 76 | Temp 97.8°F | Resp 16

## 2016-11-15 DIAGNOSIS — F0280 Dementia in other diseases classified elsewhere without behavioral disturbance: Secondary | ICD-10-CM | POA: Diagnosis not present

## 2016-11-15 DIAGNOSIS — F321 Major depressive disorder, single episode, moderate: Secondary | ICD-10-CM

## 2016-11-15 DIAGNOSIS — N3281 Overactive bladder: Secondary | ICD-10-CM | POA: Diagnosis not present

## 2016-11-15 DIAGNOSIS — G301 Alzheimer's disease with late onset: Secondary | ICD-10-CM | POA: Diagnosis not present

## 2016-11-15 MED ORDER — SERTRALINE HCL 50 MG PO TABS: 50.0000 mg | ORAL_TABLET | Freq: Every day | ORAL | 1 refills | Status: DC

## 2016-11-15 MED ORDER — OXYBUTYNIN CHLORIDE ER 5 MG PO TB24: 5.0000 mg | ORAL_TABLET | Freq: Every day | ORAL | 1 refills | Status: DC

## 2016-11-15 MED ORDER — DONEPEZIL HCL 5 MG PO TABS: 5.0000 mg | ORAL_TABLET | Freq: Every day | ORAL | 1 refills | Status: DC

## 2016-11-15 NOTE — Progress Notes (Signed)
Patient: Sherry Brooks Female    DOB: [birth year]   83 y.o.   MRN: [id number] Visit Date: 11/15/2016  Today's Provider: Margaretann LovelessJennifer M Layton Naves, PA-C   Chief Complaint  Patient presents with  . Depression   Subjective:    HPI  Depression, Follow-up  She  was last seen for this 4 weeks ago. Patient here with her daughter in law Meriam SpragueDonna Zaino. Changes made at last visit include changing wellbutrin to zoloft.   She reports excellent compliance with treatment. She is not having side effects.   She reports excellent tolerance of treatment. Current symptoms include: patient denies any symptoms She feels she is Improved since last visit.  ------------------------------------------------------------------------ She does have complaints of worsening urinary frequency that is starting to really effect her mood as she is not sleeping well because of waking constantly having to urinate. She reports she goes to the restroom every 30-60 minutes while awake and 3-5 times during the night.     No Known Allergies   Current Outpatient Prescriptions:  .  ARTIFICIAL TEAR SOLUTION OP, Apply to eye., Disp: , Rfl:  .  B Complex Vitamins (VITAMIN B COMPLEX PO), , Disp: , Rfl:  .  calcium carbonate (TUMS EX) 750 MG chewable tablet, Chew 1 tablet by mouth daily as needed for heartburn., Disp: , Rfl:  .  Calcium Carbonate-Vitamin D (CALCIUM-VITAMIN D) 600-125 MG-UNIT TABS, Take by mouth., Disp: , Rfl:  .  donepezil (ARICEPT) 5 MG tablet, Take 1 tablet (5 mg total) by mouth at bedtime., Disp: 30 tablet, Rfl: 0 .  furosemide (LASIX) 20 MG tablet, TAKE 1 TABLET(20 MG) BY MOUTH DAILY AS NEEDED FOR SWELLING, Disp: 30 tablet, Rfl: 1 .  lisinopril-hydrochlorothiazide (PRINZIDE,ZESTORETIC) 20-25 MG tablet, TAKE 1 TABLET BY MOUTH EVERY DAY., Disp: 90 tablet, Rfl: 3 .  Multiple Vitamins-Minerals (PRESERVISION AREDS) CAPS, Take by mouth., Disp: , Rfl:  .  Olopatadine HCl (PATADAY) 0.2 % SOLN, Apply to  eye., Disp: , Rfl:  .  pravastatin (PRAVACHOL) 10 MG tablet, TAKE 1 TABLET BY MOUTH EVERY NIGHT AT BEDTIME., Disp: 90 tablet, Rfl: 1 .  Psyllium (METAMUCIL PO), Take 1 capsule by mouth daily., Disp: , Rfl:  .  sertraline (ZOLOFT) 25 MG tablet, Take 1 tablet (25 mg total) by mouth at bedtime. May increase to 2 tabs PO q hs after 2 weeks, Disp: 60 tablet, Rfl: 0 .  VITAMIN E PO, Take 1 tablet by mouth daily., Disp: , Rfl:   Review of Systems  Constitutional: Negative.   Respiratory: Negative.   Cardiovascular: Positive for leg swelling.  Genitourinary: Positive for frequency.  Musculoskeletal: Positive for back pain.  Neurological: Negative.   Psychiatric/Behavioral: Positive for agitation, confusion, decreased concentration and sleep disturbance.    Social History  Substance Use Topics  . Smoking status: Never Smoker  . Smokeless tobacco: Never Used  . Alcohol use Yes     Comment: glass of wine, rare occas.    Objective:   BP 120/70 (BP Location: Left Arm, Patient Position: Sitting, Cuff Size: Large)   Pulse 76   Temp 97.8 F (36.6 C) (Oral)   Resp 16   SpO2 99%  Vitals:   11/15/16 1004  BP: 120/70  Pulse: 76  Resp: 16  Temp: 97.8 F (36.6 C)  TempSrc: Oral  SpO2: 99%     Physical Exam  Constitutional: She appears well-developed and well-nourished. No distress.  Neck: Normal range of motion. Neck supple. No JVD present.  No tracheal deviation present. No thyromegaly present.  Cardiovascular: Normal rate, regular rhythm and normal heart sounds.  Exam reveals no gallop and no friction rub.   No murmur heard. Pulmonary/Chest: Effort normal and breath sounds normal. No respiratory distress. She has no wheezes. She has no rales.  Musculoskeletal: She exhibits edema (lymphedema pumps are helping).  Lymphadenopathy:    She has no cervical adenopathy.  Skin: She is not diaphoretic.  Vitals reviewed.      Assessment & Plan:     1. Depression, major, single episode,  moderate (HCC) Improved. Diagnosis pulled for medication refill. Continue current medical treatment plan. I will see her back in 5 months for her AWV/CPE. - sertraline (ZOLOFT) 50 MG tablet; Take 1 tablet (50 mg total) by mouth daily.  Dispense: 90 tablet; Refill: 1  2. Late onset Alzheimer's disease without behavioral disturbance Stable. Diagnosis pulled for medication refill. Continue current medical treatment plan. - donepezil (ARICEPT) 5 MG tablet; Take 1 tablet (5 mg total) by mouth at bedtime.  Dispense: 90 tablet; Refill: 1  3. OAB (overactive bladder) Worsening frequency. Will start oxybutynin as below.  - oxybutynin (DITROPAN-XL) 5 MG 24 hr tablet; Take 1 tablet (5 mg total) by mouth at bedtime.  Dispense: 90 tablet; Refill: 1       Margaretann Loveless, PA-C  [facility]

## 2016-11-15 NOTE — Patient Instructions (Signed)
Oxybutynin extended-release tablets What is this medicine? OXYBUTYNIN (ox i BYOO ti nin) is used to treat overactive bladder. This medicine reduces the amount of bathroom visits. It may also help to control wetting accidents. This medicine may be used for other purposes; ask your health care provider or pharmacist if you have questions. COMMON BRAND NAME(S): Ditropan XL What should I tell my health care provider before I take this medicine? They need to know if you have any of these conditions: -autonomic neuropathy -dementia -difficulty passing urine -glaucoma -intestinal obstruction -kidney disease -liver disease -myasthenia gravis -Parkinson's disease -an unusual or allergic reaction to oxybutynin, other medicines, foods, dyes, or preservatives -pregnant or trying to get pregnant -breast-feeding How should I use this medicine? Take this medicine by mouth with a glass of water. Swallow whole, do not crush, cut, or chew. Follow the directions on the prescription label. You can take this medicine with or without food. Take your doses at regular intervals. Do not take your medicine more often than directed. Talk to your pediatrician regarding the use of this medicine in children. Special care may be needed. While this drug may be prescribed for children as young as 6 years for selected conditions, precautions do apply. Overdosage: If you think you have taken too much of this medicine contact a poison control center or emergency room at once. NOTE: This medicine is only for you. Do not share this medicine with others. What if I miss a dose? If you miss a dose, take it as soon as you can. If it is almost time for your next dose, take only that dose. Do not take double or extra doses. What may interact with this medicine? -antihistamines for allergy, cough and cold -atropine -certain medicines for bladder problems like oxybutynin, tolterodine -certain medicines for Parkinson's disease like  benztropine, trihexyphenidyl -certain medicines for stomach problems like dicyclomine, hyoscyamine -certain medicines for travel sickness like scopolamine -clarithromycin -erythromycin -ipratropium -medicines for fungal infections, like fluconazole, itraconazole, ketoconazole or voriconazole This list may not describe all possible interactions. Give your health care provider a list of all the medicines, herbs, non-prescription drugs, or dietary supplements you use. Also tell them if you smoke, drink alcohol, or use illegal drugs. Some items may interact with your medicine. What should I watch for while using this medicine? It may take a few weeks to notice the full benefit from this medicine. You may need to limit your intake tea, coffee, caffeinated sodas, and alcohol. These drinks may make your symptoms worse. You may get drowsy or dizzy. Do not drive, use machinery, or do anything that needs mental alertness until you know how this medicine affects you. Do not stand or sit up quickly, especially if you are an older patient. This reduces the risk of dizzy or fainting spells. Alcohol may interfere with the effect of this medicine. Avoid alcoholic drinks. Your mouth may get dry. Chewing sugarless gum or sucking hard candy, and drinking plenty of water may help. Contact your doctor if the problem does not go away or is severe. This medicine may cause dry eyes and blurred vision. If you wear contact lenses, you may feel some discomfort. Lubricating drops may help. See your eyecare professional if the problem does not go away or is severe. You may notice the shells of the tablets in your stool from time to time. This is normal. Avoid extreme heat. This medicine can cause you to sweat less than normal. Your body temperature could increase to dangerous   levels, which may lead to heat stroke. What side effects may I notice from receiving this medicine? Side effects that you should report to your doctor or  health care professional as soon as possible: -allergic reactions like skin rash, itching or hives, swelling of the face, lips, or tongue -agitation -breathing problems -confusion -fever -flushing (reddening of the skin) -hallucinations -memory loss -pain or difficulty passing urine -palpitations -unusually weak or tired Side effects that usually do not require medical attention (report to your doctor or health care professional if they continue or are bothersome): -constipation -headache -sexual difficulties (impotence) This list may not describe all possible side effects. Call your doctor for medical advice about side effects. You may report side effects to FDA at 1-800-FDA-1088. Where should I keep my medicine? Keep out of the reach of children. Store at room temperature between 15 and 30 degrees C (59 and 86 degrees F). Protect from moisture and humidity. Throw away any unused medicine after the expiration date. NOTE: This sheet is a summary. It may not cover all possible information. If you have questions about this medicine, talk to your doctor, pharmacist, or health care provider.  2018 Elsevier/Gold Standard (2013-08-05 10:57:06)  

## 2016-12-23 ENCOUNTER — Other Ambulatory Visit: Payer: Self-pay | Admitting: Physician Assistant

## 2016-12-23 DIAGNOSIS — F32A Depression, unspecified: Secondary | ICD-10-CM

## 2016-12-23 DIAGNOSIS — F329 Major depressive disorder, single episode, unspecified: Secondary | ICD-10-CM

## 2016-12-23 DIAGNOSIS — F419 Anxiety disorder, unspecified: Secondary | ICD-10-CM

## 2016-12-23 DIAGNOSIS — E7800 Pure hypercholesterolemia, unspecified: Secondary | ICD-10-CM

## 2016-12-27 DIAGNOSIS — H353212 Exudative age-related macular degeneration, right eye, with inactive choroidal neovascularization: Secondary | ICD-10-CM | POA: Diagnosis not present

## 2017-01-07 ENCOUNTER — Other Ambulatory Visit: Payer: Self-pay | Admitting: Physician Assistant

## 2017-01-07 DIAGNOSIS — T84033A Mechanical loosening of internal left knee prosthetic joint, initial encounter: Secondary | ICD-10-CM | POA: Diagnosis not present

## 2017-01-07 DIAGNOSIS — T84039A Mechanical loosening of unspecified internal prosthetic joint, initial encounter: Secondary | ICD-10-CM

## 2017-01-07 DIAGNOSIS — T84032A Mechanical loosening of internal right knee prosthetic joint, initial encounter: Secondary | ICD-10-CM | POA: Diagnosis not present

## 2017-01-07 DIAGNOSIS — M25562 Pain in left knee: Secondary | ICD-10-CM | POA: Diagnosis not present

## 2017-01-07 DIAGNOSIS — M25561 Pain in right knee: Secondary | ICD-10-CM | POA: Diagnosis not present

## 2017-01-09 ENCOUNTER — Ambulatory Visit (INDEPENDENT_AMBULATORY_CARE_PROVIDER_SITE_OTHER): Payer: Medicare Other | Admitting: Podiatry

## 2017-01-09 ENCOUNTER — Other Ambulatory Visit: Payer: Self-pay | Admitting: Physician Assistant

## 2017-01-09 ENCOUNTER — Encounter: Payer: Self-pay | Admitting: Podiatry

## 2017-01-09 DIAGNOSIS — B351 Tinea unguium: Secondary | ICD-10-CM | POA: Diagnosis not present

## 2017-01-09 DIAGNOSIS — M25562 Pain in left knee: Secondary | ICD-10-CM

## 2017-01-09 DIAGNOSIS — R609 Edema, unspecified: Secondary | ICD-10-CM

## 2017-01-09 DIAGNOSIS — T84039A Mechanical loosening of unspecified internal prosthetic joint, initial encounter: Secondary | ICD-10-CM

## 2017-01-09 DIAGNOSIS — M79676 Pain in unspecified toe(s): Secondary | ICD-10-CM

## 2017-01-09 NOTE — Progress Notes (Signed)
Complaint:  Visit Type: Patient returns to my office for continued preventative foot care services. Complaint: Patient states" my nails have grown long and thick and become painful to walk and wear shoes" Patient has been diagnosed with DM with no foot complications. The patient presents for preventative foot care services. No changes to ROS  Podiatric Exam: Vascular: dorsalis pedis  are palpable bilateral. Posterior tibial pulses are absent secondary severe leg and ankle swelling. Capillary return is immediate. Temperature gradient is WNL. Skin turgor WNL  Sensorium: Normal Semmes Weinstein monofilament test. Normal tactile sensation bilaterally. Nail Exam: Pt has thick disfigured discolored nails with subungual debris noted bilateral entire nail hallux through fifth toenails Ulcer Exam: There is no evidence of ulcer or pre-ulcerative changes or infection. Orthopedic Exam: Muscle tone and strength are WNL. No limitations in general ROM. No crepitus or effusions noted. Foot type and digits show no abnormalities. Bony prominences are unremarkable. Skin: No Porokeratosis. No infection or ulcers  Diagnosis:  Onychomycosis, , Pain in right toe, pain in left toes  Treatment & Plan Procedures and Treatment: Consent by patient was obtained for treatment procedures. The patient understood the discussion of treatment and procedures well. All questions were answered thoroughly reviewed. Debridement of mycotic and hypertrophic toenails, 1 through 5 bilateral and clearing of subungual debris. No ulceration, no infection noted.  Patient was instructed to use pillows at bedtime for heel pain. Return Visit-Office Procedure: Patient instructed to return to the office for a follow up visit 3 months for continued evaluation and treatment.    Helane GuntherGregory Vannia Pola DPM

## 2017-01-17 ENCOUNTER — Encounter
Admission: RE | Admit: 2017-01-17 | Discharge: 2017-01-17 | Disposition: A | Discharge status: Home / Self Care | Payer: Medicare Other | Source: Ambulatory Visit | Attending: Physician Assistant | Admitting: Physician Assistant

## 2017-01-17 DIAGNOSIS — T84039A Mechanical loosening of unspecified internal prosthetic joint, initial encounter: Secondary | ICD-10-CM | POA: Insufficient documentation

## 2017-01-17 DIAGNOSIS — M25562 Pain in left knee: Secondary | ICD-10-CM | POA: Insufficient documentation

## 2017-01-17 DIAGNOSIS — M7989 Other specified soft tissue disorders: Secondary | ICD-10-CM | POA: Diagnosis not present

## 2017-01-17 MED ORDER — TECHNETIUM TC 99M MEDRONATE IV KIT
25.0000 | PACK | Freq: Once | INTRAVENOUS | Status: AC | PRN
  Administered 2017-01-17: 24.73 via INTRAVENOUS

## 2017-01-21 DIAGNOSIS — Z96652 Presence of left artificial knee joint: Secondary | ICD-10-CM | POA: Diagnosis not present

## 2017-01-21 DIAGNOSIS — T84039D Mechanical loosening of unspecified internal prosthetic joint, subsequent encounter: Secondary | ICD-10-CM | POA: Diagnosis not present

## 2017-02-24 ENCOUNTER — Ambulatory Visit (INDEPENDENT_AMBULATORY_CARE_PROVIDER_SITE_OTHER): Payer: Medicare Other | Admitting: Physician Assistant

## 2017-02-24 ENCOUNTER — Encounter: Payer: Self-pay | Admitting: Physician Assistant

## 2017-02-24 VITALS — BP 146/86 | HR 73 | Temp 98.4°F

## 2017-02-24 DIAGNOSIS — I10 Essential (primary) hypertension: Secondary | ICD-10-CM | POA: Diagnosis not present

## 2017-02-24 DIAGNOSIS — Z23 Encounter for immunization: Secondary | ICD-10-CM | POA: Diagnosis not present

## 2017-02-24 DIAGNOSIS — E7800 Pure hypercholesterolemia, unspecified: Secondary | ICD-10-CM

## 2017-02-24 DIAGNOSIS — Z7189 Other specified counseling: Secondary | ICD-10-CM

## 2017-02-24 DIAGNOSIS — N3946 Mixed incontinence: Secondary | ICD-10-CM

## 2017-02-24 DIAGNOSIS — E1122 Type 2 diabetes mellitus with diabetic chronic kidney disease: Secondary | ICD-10-CM

## 2017-02-24 DIAGNOSIS — M170 Bilateral primary osteoarthritis of knee: Secondary | ICD-10-CM

## 2017-02-24 DIAGNOSIS — E038 Other specified hypothyroidism: Secondary | ICD-10-CM

## 2017-02-24 DIAGNOSIS — Z111 Encounter for screening for respiratory tuberculosis: Secondary | ICD-10-CM

## 2017-02-24 DIAGNOSIS — E039 Hypothyroidism, unspecified: Secondary | ICD-10-CM | POA: Diagnosis not present

## 2017-02-24 DIAGNOSIS — N3281 Overactive bladder: Secondary | ICD-10-CM | POA: Diagnosis not present

## 2017-02-24 MED ORDER — OXYBUTYNIN CHLORIDE ER 5 MG PO TB24: 10.0000 mg | ORAL_TABLET | Freq: Every day | ORAL | 1 refills | Status: DC

## 2017-02-24 NOTE — Patient Instructions (Signed)
Urinary Incontinence Urinary incontinence is the involuntary loss of urine from your bladder. What are the causes? There are many causes of urinary incontinence. They include:  Medicines.  Infections.  Prostatic enlargement, leading to overflow of urine from your bladder.  Surgery.  Neurological diseases.  Emotional factors.  What are the signs or symptoms? Urinary Incontinence can be divided into four types: 1. Urge incontinence. Urge incontinence is the involuntary loss of urine before you have the opportunity to go to the bathroom. There is a sudden urge to void but not enough time to reach a bathroom. 2. Stress incontinence. Stress incontinence is the sudden loss of urine with any activity that forces urine to pass. It is commonly caused by anatomical changes to the pelvis and sphincter areas of your body. 3. Overflow incontinence. Overflow incontinence is the loss of urine from an obstructed opening to your bladder. This results in a backup of urine and a resultant buildup of pressure within the bladder. When the pressure within the bladder exceeds the closing pressure of the sphincter, the urine overflows, which causes incontinence, similar to water overflowing a dam. 4. Total incontinence. Total incontinence is the loss of urine as a result of the inability to store urine within your bladder.  How is this diagnosed? Evaluating the cause of incontinence may require:  A thorough and complete medical and obstetric history.  A complete physical exam.  Laboratory tests such as a urine culture and sensitivities.  When additional tests are indicated, they can include:  An ultrasound exam.  Kidney and bladder X-rays.  Cystoscopy. This is an exam of the bladder using a narrow scope.  Urodynamic testing to test the nerve function to the bladder and sphincter areas.  How is this treated? Treatment for urinary incontinence depends on the cause:  For urge incontinence caused  by a bacterial infection, antibiotics will be prescribed. If the urge incontinence is related to medicines you take, your health care provider may have you change the medicine.  For stress incontinence, surgery to re-establish anatomical support to the bladder or sphincter, or both, will often correct the condition.  For overflow incontinence caused by an enlarged prostate, an operation to open the channel through the enlarged prostate will allow the flow of urine out of the bladder. In women with fibroids, a hysterectomy may be recommended.  For total incontinence, surgery on your urinary sphincter may help. An artificial urinary sphincter (an inflatable cuff placed around the urethra) may be required. In women who have developed a hole-like passage between their bladder and vagina (vesicovaginal fistula), surgery to close the fistula often is required.  Follow these instructions at home:  Normal daily hygiene and the use of pads or adult diapers that are changed regularly will help prevent odors and skin damage.  Avoid caffeine. It can overstimulate your bladder.  Use the bathroom regularly. Try about every 2-3 hours to go to the bathroom, even if you do not feel the need to do so. Take time to empty your bladder completely. After urinating, wait a minute. Then try to urinate again.  For causes involving nerve dysfunction, keep a log of the medicines you take and a journal of the times you go to the bathroom. Contact a health care provider if:  You experience worsening of pain instead of improvement in pain after your procedure.  Your incontinence becomes worse instead of better. Get help right away if:  You experience fever or shaking chills.  You are unable to   pass your urine.  You have redness spreading into your groin or down into your thighs. This information is not intended to replace advice given to you by your health care provider. Make sure you discuss any questions you have  with your health care provider. Document Released: 06/27/2004 Document Revised: 12/29/2015 Document Reviewed: 10/27/2012 Elsevier Interactive Patient Education  2018 Elsevier Inc.  

## 2017-02-24 NOTE — Progress Notes (Signed)
Patient: Sherry Brooks Female    DOB: 07/02/1928   83 y.o.   MRN: [id number] Visit Date: 02/24/2017  Today's Provider: Margaretann Loveless, PA-C   Chief Complaint  Patient presents with  . Urinary Frequency  . Flu Vaccine   Subjective:    HPI Patient here today for influenza vaccine.   Patient C/O incontinece worsening reports that she is having to get up 4-5 times a night. She has been taking Oxybutynin  nightly without much relief. She also has been seen by Dr. Sherryl Barters, Urology.   Patient reports that she will be having left total knee revision on 03/24/2017  and will go to [facility] for rehab. Patient reports that she will be going into assisted living facility [facility], and they are requesting a TB lab test to be done before admission.  She and her daughter-in-law also wish to discuss DNR today. She wishes to be DNR. She already has a living will in place and her daughter-in-law, [name], is her HCPOA.     No Known Allergies   Current Outpatient Prescriptions:  .  ARTIFICIAL TEAR SOLUTION OP, Apply to eye., Disp: , Rfl:  .  B Complex Vitamins (VITAMIN B COMPLEX PO), , Disp: , Rfl:  .  calcium carbonate (TUMS EX) 750 MG chewable tablet, Chew 1 tablet by mouth daily as needed for heartburn., Disp: , Rfl:  .  Calcium Carbonate-Vitamin D (CALCIUM-VITAMIN D) 600-125 MG-UNIT TABS, Take by mouth., Disp: , Rfl:  .  donepezil (ARICEPT) 5 MG tablet, Take 1 tablet (5 mg total) by mouth at bedtime., Disp: 90 tablet, Rfl: 1 .  lisinopril-hydrochlorothiazide (PRINZIDE,ZESTORETIC) 20-25 MG tablet, TAKE 1 TABLET BY MOUTH EVERY DAY., Disp: 90 tablet, Rfl: 3 .  Multiple Vitamins-Minerals (PRESERVISION AREDS) CAPS, Take by mouth., Disp: , Rfl:  .  Olopatadine HCl (PATADAY) 0.2 % SOLN, Apply to eye., Disp: , Rfl:  .  oxybutynin (DITROPAN-XL) 5 MG 24 hr tablet, Take 1 tablet (5 mg total) by mouth at bedtime., Disp: 90 tablet, Rfl: 1 .  pravastatin (PRAVACHOL) 10 MG tablet,  TAKE 1 TABLET BY MOUTH EVERY NIGHT AT BEDTIME, Disp: 90 tablet, Rfl: 1 .  Psyllium (METAMUCIL PO), Take 1 capsule by mouth daily., Disp: , Rfl:  .  sertraline (ZOLOFT) 50 MG tablet, Take 1 tablet (50 mg total) by mouth daily., Disp: 90 tablet, Rfl: 1 .  VITAMIN E PO, Take 1 tablet by mouth daily., Disp: , Rfl:   Review of Systems  HENT: Negative.   Respiratory: Negative.   Cardiovascular: Negative.   Gastrointestinal: Negative.   Genitourinary: Positive for enuresis and frequency.  Musculoskeletal: Positive for arthralgias and joint swelling.  Neurological: Positive for weakness.    Social History  Substance Use Topics  . Smoking status: Never Smoker  . Smokeless tobacco: Never Used  . Alcohol use Yes     Comment: glass of wine, rare occas.    Objective:   BP (!) 146/86 (BP Location: Left Arm, Patient Position: Sitting, Cuff Size: Large)   Pulse 73   Temp 98.4 F (36.9 C) (Oral)   SpO2 95%  Vitals:   02/24/17 1509  BP: (!) 146/86  Pulse: 73  Temp: 98.4 F (36.9 C)  TempSrc: Oral  SpO2: 95%     Physical Exam  Constitutional: She appears well-developed and well-nourished. No distress.  Neck: Normal range of motion. Neck supple.  Cardiovascular: Normal rate, regular rhythm and normal heart sounds.  Exam reveals  no gallop and no friction rub.   No murmur heard. Pulmonary/Chest: Effort normal and breath sounds normal. No respiratory distress. She has no wheezes. She has no rales.  Skin: She is not diaphoretic.  Vitals reviewed.      Assessment & Plan:     1. Mixed stress and urge urinary incontinence Will increase oxybutynin to  as below x 1 week. [name] is to call the office and let us know if this improves her nocturia. If so will send in refill of  instead.  - oxybutynin (DITROPAN-XL) 5 MG 24 hr tablet; Take 2 tablets (10 mg total) by mouth at bedtime.  Dispense: 90 tablet; Refill: 1  2. Essential hypertension Stable. Continue lisinopril-hctz  20-25mg .  3. Type 2 diabetes mellitus with diabetic chronic kidney disease, unspecified CKD stage, unspecified whether long term insulin use (HCC) Currently treatment not required. Will check labs as below and f/u pending results. - HgB A1c  4. Subclinical hypothyroidism Will check labs as below and f/u pending results. Still asymptomatic. - TSH  5. Primary osteoarthritis of both knees Has had TKR and is scheduled for revision of the left knee on 03/24/17 with Dr. Ernest Pine.   6. Hypercholesterolemia Stable on pravastatin . Will check labs as below and f/u pending results. - Lipid Profile  7. Need for influenza vaccination Flu vaccine given today without complication. Patient sat upright for 15 minutes to check for adverse reaction before being released. - Flu vaccine HIGH DOSE PF  8. Screening for tuberculosis - Quantiferon tb gold assay (blood)  9. OAB (overactive bladder) See plan for #1.   10. DNR (do not resuscitate) discussion Discussed in detail and patient wishes to be DNR. Form was completed today in the office and given to [name] to be taken with patient wherever she goes with upcoming surgery, rehab, and assisted living. They both voiced understanding.        Margaretann Loveless, PA-C  [facility]

## 2017-02-25 ENCOUNTER — Telehealth: Payer: Self-pay | Admitting: *Deleted

## 2017-02-26 LAB — HEMOGLOBIN A1C
Hgb A1c MFr Bld: 5.8 % of total Hgb — ABNORMAL HIGH (ref <5.7)
MEAN PLASMA GLUCOSE: 120 (calc)
eAG (mmol/L): 6.6 (calc)

## 2017-02-26 LAB — TSH: TSH: 2.53 mIU/L (ref 0.40–4.50)

## 2017-02-26 LAB — LIPID PANEL
CHOL/HDL RATIO: 2.5 (calc) (ref <5.0)
CHOLESTEROL: 174 mg/dL (ref <200)
HDL: 71 mg/dL (ref >50)
LDL Cholesterol (Calc): 82 mg/dL (calc)
NON-HDL CHOLESTEROL (CALC): 103 mg/dL (ref <130)
TRIGLYCERIDES: 115 mg/dL (ref <150)

## 2017-02-26 LAB — QUANTIFERON TB GOLD ASSAY (BLOOD)
QUANTIFERON NIL VALUE: 0.13 [IU]/mL
QUANTIFERON(R)-TB GOLD: NEGATIVE
Quantiferon Tb Ag Minus Nil Value: <0 IU/mL

## 2017-02-26 NOTE — Telephone Encounter (Signed)
[name] advised as directed below.  Thanks,  -Fantasy Donald

## 2017-02-26 NOTE — Telephone Encounter (Signed)
-----   Message from Margaretann Loveless, [place] sent at 02/26/2017  3:36 PM EDT ----- Sherry Brooks is negative. [name] should be able to print this result from [place] for the nursing facilities.

## 2017-02-28 ENCOUNTER — Telehealth: Payer: Self-pay | Admitting: Physician Assistant

## 2017-02-28 DIAGNOSIS — N3281 Overactive bladder: Secondary | ICD-10-CM

## 2017-02-28 MED ORDER — OXYBUTYNIN CHLORIDE ER 10 MG PO TB24: 10.0000 mg | ORAL_TABLET | Freq: Every day | ORAL | 1 refills | Status: DC

## 2017-02-28 NOTE — Telephone Encounter (Signed)
Pt's daughter in law called saying they did up Mrs. Bretado's Oxybutynin from 5 mg to .  She said this was working and to please send the  pill  to the pharmacy.  [facility] [address]  Their call back is [phone]  Thanks Barth Kirks

## 2017-02-28 NOTE — Telephone Encounter (Signed)
Sent in the 10 mg

## 2017-02-28 NOTE — Telephone Encounter (Signed)
Please Review.  Thanks,  -Lief Palmatier 

## 2017-03-03 NOTE — Telephone Encounter (Signed)
L/M advising below.  

## 2017-03-06 ENCOUNTER — Telehealth: Payer: Self-pay | Admitting: Physician Assistant

## 2017-03-06 DIAGNOSIS — N3281 Overactive bladder: Secondary | ICD-10-CM

## 2017-03-06 NOTE — Telephone Encounter (Signed)
What prescription? As you did not state. KW

## 2017-03-06 NOTE — Telephone Encounter (Signed)
Pt is requesting that this be sent to [facility]. Please advise. Thanks TNP

## 2017-03-07 MED ORDER — OXYBUTYNIN CHLORIDE ER 10 MG PO TB24: 10.0000 mg | ORAL_TABLET | Freq: Every day | ORAL | 1 refills | Status: DC

## 2017-03-07 NOTE — Telephone Encounter (Signed)
Called and canceled the one that was [facility] [place].

## 2017-03-07 NOTE — Telephone Encounter (Signed)
oxybutynin (DITROPAN-XL) 10 MG 24 hr tablet   It was [facility] [address] and pt needs it sent to [facility]. Please advise. Thanks TNP

## 2017-03-07 NOTE — Telephone Encounter (Signed)
[facility]ropan sent to [facility]

## 2017-03-09 ENCOUNTER — Emergency Department: Payer: Medicare Other

## 2017-03-09 ENCOUNTER — Inpatient Hospital Stay
Admission: EM | Admit: 2017-03-09 | Discharge: 2017-03-11 | DRG: 690 | Disposition: A | Discharge status: Skilled Nursing Facility | Payer: Medicare Other | Attending: Specialist | Admitting: Specialist

## 2017-03-09 DIAGNOSIS — G8929 Other chronic pain: Secondary | ICD-10-CM

## 2017-03-09 DIAGNOSIS — E1151 Type 2 diabetes mellitus with diabetic peripheral angiopathy without gangrene: Secondary | ICD-10-CM | POA: Diagnosis present

## 2017-03-09 DIAGNOSIS — F0390 Unspecified dementia without behavioral disturbance: Secondary | ICD-10-CM | POA: Diagnosis present

## 2017-03-09 DIAGNOSIS — M25561 Pain in right knee: Secondary | ICD-10-CM | POA: Diagnosis not present

## 2017-03-09 DIAGNOSIS — N390 Urinary tract infection, site not specified: Secondary | ICD-10-CM

## 2017-03-09 DIAGNOSIS — I10 Essential (primary) hypertension: Secondary | ICD-10-CM | POA: Diagnosis not present

## 2017-03-09 DIAGNOSIS — M25562 Pain in left knee: Secondary | ICD-10-CM

## 2017-03-09 DIAGNOSIS — Z96651 Presence of right artificial knee joint: Secondary | ICD-10-CM | POA: Diagnosis not present

## 2017-03-09 DIAGNOSIS — M1990 Unspecified osteoarthritis, unspecified site: Secondary | ICD-10-CM | POA: Diagnosis present

## 2017-03-09 DIAGNOSIS — M6281 Muscle weakness (generalized): Secondary | ICD-10-CM | POA: Diagnosis not present

## 2017-03-09 DIAGNOSIS — F329 Major depressive disorder, single episode, unspecified: Secondary | ICD-10-CM | POA: Diagnosis not present

## 2017-03-09 DIAGNOSIS — B9620 Unspecified Escherichia coli [E. coli] as the cause of diseases classified elsewhere: Secondary | ICD-10-CM | POA: Diagnosis present

## 2017-03-09 DIAGNOSIS — Z87442 Personal history of urinary calculi: Secondary | ICD-10-CM

## 2017-03-09 DIAGNOSIS — Z981 Arthrodesis status: Secondary | ICD-10-CM

## 2017-03-09 DIAGNOSIS — S3993XA Unspecified injury of pelvis, initial encounter: Secondary | ICD-10-CM | POA: Diagnosis not present

## 2017-03-09 DIAGNOSIS — R531 Weakness: Secondary | ICD-10-CM

## 2017-03-09 DIAGNOSIS — H3530 Unspecified macular degeneration: Secondary | ICD-10-CM | POA: Diagnosis not present

## 2017-03-09 DIAGNOSIS — M79604 Pain in right leg: Secondary | ICD-10-CM | POA: Diagnosis not present

## 2017-03-09 DIAGNOSIS — E119 Type 2 diabetes mellitus without complications: Secondary | ICD-10-CM | POA: Diagnosis not present

## 2017-03-09 DIAGNOSIS — E785 Hyperlipidemia, unspecified: Secondary | ICD-10-CM | POA: Diagnosis present

## 2017-03-09 DIAGNOSIS — K219 Gastro-esophageal reflux disease without esophagitis: Secondary | ICD-10-CM | POA: Diagnosis present

## 2017-03-09 DIAGNOSIS — M25569 Pain in unspecified knee: Secondary | ICD-10-CM | POA: Diagnosis not present

## 2017-03-09 DIAGNOSIS — E7800 Pure hypercholesterolemia, unspecified: Secondary | ICD-10-CM | POA: Diagnosis present

## 2017-03-09 DIAGNOSIS — Z471 Aftercare following joint replacement surgery: Secondary | ICD-10-CM | POA: Diagnosis not present

## 2017-03-09 DIAGNOSIS — Z96653 Presence of artificial knee joint, bilateral: Secondary | ICD-10-CM | POA: Diagnosis present

## 2017-03-09 DIAGNOSIS — Z7401 Bed confinement status: Secondary | ICD-10-CM | POA: Diagnosis not present

## 2017-03-09 LAB — URINALYSIS, COMPLETE (UACMP) WITH MICROSCOPIC
Bilirubin Urine: NEGATIVE
GLUCOSE, UA: NEGATIVE mg/dL
HGB URINE DIPSTICK: NEGATIVE
KETONES UR: 5 mg/dL — AB
NITRITE: POSITIVE — AB
PH: 5 (ref 5.0–8.0)
Protein, ur: NEGATIVE mg/dL
Specific Gravity, Urine: 1.012 (ref 1.005–1.030)

## 2017-03-09 LAB — CBC WITH DIFFERENTIAL/PLATELET
BASOS ABS: 0 10*3/uL (ref 0–0.1)
Basophils Relative: 0 %
EOS PCT: 1 %
Eosinophils Absolute: 0 10*3/uL (ref 0–0.7)
HCT: 35.3 % (ref 35.0–47.0)
Hemoglobin: 12.1 g/dL (ref 12.0–16.0)
LYMPHS PCT: 25 %
Lymphs Abs: 1.9 10*3/uL (ref 1.0–3.6)
MCH: 29.6 pg (ref 26.0–34.0)
MCHC: 34.4 g/dL (ref 32.0–36.0)
MCV: 86.1 fL (ref 80.0–100.0)
MONO ABS: 0.7 10*3/uL (ref 0.2–0.9)
Monocytes Relative: 10 %
Neutro Abs: 4.8 10*3/uL (ref 1.4–6.5)
Neutrophils Relative %: 64 %
PLATELETS: 308 10*3/uL (ref 150–440)
RBC: 4.09 MIL/uL (ref 3.80–5.20)
RDW: 13.9 % (ref 11.5–14.5)
WBC: 7.6 10*3/uL (ref 3.6–11.0)

## 2017-03-09 LAB — BASIC METABOLIC PANEL
ANION GAP: 7 (ref 5–15)
BUN: 21 mg/dL — ABNORMAL HIGH (ref 6–20)
CALCIUM: 9.2 mg/dL (ref 8.9–10.3)
CHLORIDE: 101 mmol/L (ref 101–111)
CO2: 26 mmol/L (ref 22–32)
Creatinine, Ser: 0.87 mg/dL (ref 0.44–1.00)
GFR calc non Af Amer: 59 mL/min — ABNORMAL LOW (ref >60)
Glucose, Bld: 195 mg/dL — ABNORMAL HIGH (ref 65–99)
POTASSIUM: 3.8 mmol/L (ref 3.5–5.1)
Sodium: 134 mmol/L — ABNORMAL LOW (ref 135–145)

## 2017-03-09 MED ORDER — HYDROCODONE-ACETAMINOPHEN 5-325 MG PO TABS
1.0000 | ORAL_TABLET | Freq: Once | ORAL | Status: AC
  Administered 2017-03-09: 1 via ORAL
  Filled 2017-03-09: qty 1

## 2017-03-09 MED ORDER — ONDANSETRON HCL 4 MG PO TABS: 4.0000 mg | ORAL_TABLET | Freq: Four times a day (QID) | ORAL | Status: DC | PRN

## 2017-03-09 MED ORDER — ENOXAPARIN SODIUM 40 MG/0.4ML ~~LOC~~ SOLN
40.0000 mg | SUBCUTANEOUS | Status: DC
  Administered 2017-03-10: 40 mg via SUBCUTANEOUS
  Filled 2017-03-09: qty 0.4

## 2017-03-09 MED ORDER — ACETAMINOPHEN 325 MG PO TABS: 650.0000 mg | ORAL_TABLET | Freq: Four times a day (QID) | ORAL | Status: DC | PRN

## 2017-03-09 MED ORDER — PRAVASTATIN SODIUM 20 MG PO TABS
10.0000 mg | ORAL_TABLET | Freq: Every day | ORAL | Status: DC
  Administered 2017-03-10: 10 mg via ORAL
  Filled 2017-03-09: qty 1

## 2017-03-09 MED ORDER — ONDANSETRON HCL 4 MG/2ML IJ SOLN: 4.0000 mg | Freq: Four times a day (QID) | INTRAMUSCULAR | Status: DC | PRN

## 2017-03-09 MED ORDER — DEXTROSE 5 % IV SOLN
2.0000 g | INTRAVENOUS | Status: DC
  Administered 2017-03-10: 2 g via INTRAVENOUS
  Filled 2017-03-09 (×2): qty 2

## 2017-03-09 MED ORDER — DONEPEZIL HCL 5 MG PO TABS
5.0000 mg | ORAL_TABLET | Freq: Every day | ORAL | Status: DC
  Administered 2017-03-10: 5 mg via ORAL
  Filled 2017-03-09: qty 1
  Filled 2017-03-09: qty 0.5
  Filled 2017-03-09: qty 1

## 2017-03-09 MED ORDER — SERTRALINE HCL 50 MG PO TABS
50.0000 mg | ORAL_TABLET | Freq: Every day | ORAL | Status: DC
  Administered 2017-03-10 – 2017-03-11 (×2): 50 mg via ORAL
  Filled 2017-03-09 (×2): qty 1

## 2017-03-09 MED ORDER — CEFTRIAXONE SODIUM IN DEXTROSE 20 MG/ML IV SOLN
1.0000 g | Freq: Once | INTRAVENOUS | Status: AC
  Administered 2017-03-09: 1 g via INTRAVENOUS
  Filled 2017-03-09: qty 50

## 2017-03-09 MED ORDER — ACETAMINOPHEN 650 MG RE SUPP: 650.0000 mg | Freq: Four times a day (QID) | RECTAL | Status: DC | PRN

## 2017-03-09 NOTE — ED Notes (Signed)
Patient was cleaned, new diaper put on and repositioned on the stretcher. Patient tolerated the procedure well.

## 2017-03-09 NOTE — ED Notes (Signed)
PT at bedside.

## 2017-03-09 NOTE — H&P (Signed)
[facility] [place] [facility]   PATIENT NAME: Sherry Brooks    MR#:  [id number]  DATE OF BIRTH:  16-Feb-1929  DATE OF ADMISSION:  03/09/2017  PRIMARY CARE PHYSICIAN: Margaretann Loveless, PA-C   REQUESTING/REFERRING PHYSICIAN: Scotty Court, MD  CHIEF COMPLAINT:   Chief Complaint  Patient presents with  . Knee Pain    HISTORY OF PRESENT ILLNESS:  Sherry Brooks  is a 83 y.o. female who presents with Weakness over the last 24 hours. Patient denies any symptoms of UTI or dysuria, except for perhaps some urinary frequency, but UA here in the ED showed nitrite positive. Hospitalists were called for admission  PAST MEDICAL HISTORY:   Past Medical History:  Diagnosis Date  . Anxiety   . Arthritis    knees  . Borderline diabetic   . Complication of anesthesia    wakes up slowly, takes longer than usual to wake  . Depression   . Diabetes (HCC)   . GERD (gastroesophageal reflux disease)    on occas. uses TUMS for indigestion   . Heart murmur   . History of stress test    10 yrs. ago, states it was normal , had been ordered for pt. due to stress related to husband's illness,, by Dr. Vic Ripper  . Hyperlipemia   . Hypertension   . Macular degeneration   . Peripheral vascular disease (HCC)    blood clots in legs mid 2000's   . Renal calculus    history of > 50 yrs. ago    PAST SURGICAL HISTORY:   Past Surgical History:  Procedure Laterality Date  . APPENDECTOMY     as a teenager   . BIOPSY THYROID  06/2008  . COLON SURGERY     due to diverticulitis- had colostomy, then takedown   . EYE SURGERY     cataracts removed - /w IOL  . FRACTURE SURGERY     L wrist, plate in place  . JOINT REPLACEMENT     both knees   . SPINE SURGERY  10/23/2012   Spinal Fusion, lower back    SOCIAL HISTORY:   Social History  Substance Use Topics  . Smoking status: Never Smoker  . Smokeless tobacco: Never Used  . Alcohol use No     Comment: glass  of wine, rare occas.     FAMILY HISTORY:   Family History  Problem Relation Age of Onset  . Arthritis Mother   . Arthritis Father   . Bladder Cancer Neg Hx   . Kidney cancer Neg Hx   . Prostate cancer Neg Hx     DRUG ALLERGIES:  No Known Allergies  MEDICATIONS AT HOME:   Prior to Admission medications   Medication Sig Start Date End Date Taking? Authorizing Provider  acetaminophen (TYLENOL) 650 MG CR tablet Take 650 mg by mouth every 6 (six) hours.   Yes [provider]  ARTIFICIAL TEAR SOLUTION OP Apply 1 drop to eye daily.    Yes [provider]  B Complex Vitamins (VITAMIN B COMPLEX PO) Take 1 tablet by mouth daily.    Yes [provider]  donepezil (ARICEPT) 5 MG tablet Take 1 tablet (5 mg total) by mouth at bedtime. 11/15/16  Yes Burnette, Victorino Dike M, PA-C  lisinopril-hydrochlorothiazide (PRINZIDE,ZESTORETIC) 20-25 MG tablet TAKE 1 TABLET BY MOUTH EVERY DAY. 04/01/16  Yes Malva Limes, MD  Multiple Minerals-Vitamins (CALCIUM-MAGNESIUM-ZINC-D3 PO) Take 1 tablet by mouth daily.   Yes [provider]  Multiple Vitamins-Minerals (PRESERVISION AREDS) CAPS Take 2 capsules by mouth daily.    Yes [provider]  Olopatadine HCl (PATADAY) 0.2 % SOLN Apply 1 drop to eye daily.    Yes [provider]  oxybutynin (DITROPAN-XL) 10 MG 24 hr tablet Take 1 tablet (10 mg total) by mouth at bedtime. 03/07/17  Yes Joycelyn Man M, PA-C  pravastatin (PRAVACHOL) 10 MG tablet TAKE 1 TABLET BY MOUTH EVERY NIGHT AT BEDTIME 12/24/16  Yes Margaretann Loveless, PA-C  Psyllium (METAMUCIL PO) Take 6-7 capsules by mouth daily.    Yes [provider]  sertraline (ZOLOFT) 50 MG tablet Take 1 tablet (50 mg total) by mouth daily. 11/15/16  Yes Margaretann Loveless, PA-C  vitamin E 400 UNIT capsule Take 400 Units by mouth daily.    Yes [provider]    REVIEW OF SYSTEMS:  Review of Systems  Constitutional: Negative for chills,  fever, malaise/fatigue and weight loss.  HENT: Negative for ear pain, hearing loss and tinnitus.   Eyes: Negative for blurred vision, double vision, pain and redness.  Respiratory: Negative for cough, hemoptysis and shortness of breath.   Cardiovascular: Negative for chest pain, palpitations, orthopnea and leg swelling.  Gastrointestinal: Negative for abdominal pain, constipation, diarrhea, nausea and vomiting.  Genitourinary: Positive for frequency. Negative for dysuria and hematuria.  Musculoskeletal: Negative for back pain, joint pain and neck pain.  Skin:       No acne, rash, or lesions  Neurological: Positive for weakness. Negative for dizziness, tremors and focal weakness.  Endo/Heme/Allergies: Negative for polydipsia. Does not bruise/bleed easily.  Psychiatric/Behavioral: Negative for depression. The patient is not nervous/anxious and does not have insomnia.      VITAL SIGNS:   Vitals:   03/09/17 1838 03/09/17 1900 03/09/17 2000 03/09/17 2032  BP: (!) 119/53 (!) 117/57 (!) 120/57 (!) 126/50  Pulse: 74 66  63  Resp:    18  Temp:      TempSrc:      SpO2: 96% 94%  97%  Weight:      Height:       Wt Readings from Last 3 Encounters:  03/09/17 98.4 kg (217 lb)  10/17/16 98.4 kg (217 lb)  09/13/16 100.4 kg (221 lb 6.4 oz)    PHYSICAL EXAMINATION:  Physical Exam  Vitals reviewed. Constitutional: She is oriented to person, place, and time. She appears well-developed and well-nourished. No distress.  HENT:  Head: Normocephalic and atraumatic.  Mouth/Throat: Oropharynx is clear and moist.  Eyes: Pupils are equal, round, and reactive to light. Conjunctivae and EOM are normal. No scleral icterus.  Neck: Normal range of motion. Neck supple. No JVD present. No thyromegaly present.  Cardiovascular: Normal rate, regular rhythm and intact distal pulses.  Exam reveals no gallop and no friction rub.   No murmur heard. Respiratory: Effort normal and breath sounds normal. No respiratory  distress. She has no wheezes. She has no rales.  GI: Soft. Bowel sounds are normal. She exhibits no distension. There is no tenderness.  Musculoskeletal: Normal range of motion. She exhibits no edema.  No arthritis, no gout  Lymphadenopathy:    She has no cervical adenopathy.  Neurological: She is alert and oriented to person, place, and time. No cranial nerve deficit.  No dysarthria, no aphasia  Skin: Skin is warm and dry. No rash noted. No erythema.  Psychiatric: She has a normal mood and affect. Her behavior is normal. Judgment and thought content normal.  LABORATORY PANEL:   CBC  Recent Labs Lab 03/09/17 1834  WBC 7.6  HGB 12.1  HCT 35.3  PLT 308   ------------------------------------------------------------------------------------------------------------------  Chemistries   Recent Labs Lab 03/09/17 1834  NA 134*  K 3.8  CL 101  CO2 26  GLUCOSE 195*  BUN 21*  CREATININE 0.87  CALCIUM 9.2   ------------------------------------------------------------------------------------------------------------------  Cardiac Enzymes No results for input(s): TROPONINI in the last 168 hours. ------------------------------------------------------------------------------------------------------------------  RADIOLOGY:  Dg Pelvis 1-2 Views  Result Date: 03/09/2017 CLINICAL DATA:  Fall 2 days ago, pt unable to walk since. Bilateral knee pain, imaging performed earlier today. EXAM: PELVIS - 1-2 VIEW COMPARISON:  None. FINDINGS: Generalized osteopenia. No acute fracture or dislocation. Moderate osteoarthritis of bilateral hips, right greater than left. IMPRESSION: No acute osseous injury of the pelvis. Given the patient's age and osteopenia, if there is persistent clinical concern for an occult hip fracture, a MRI of the hip is recommended for increased sensitivity. Electronically Signed   By: Elige Ko   On: 03/09/2017 13:35   Dg Knee Complete 4 Views Left  Result Date:  03/09/2017 CLINICAL DATA:  83 year old female with history of bilateral knee pain after a fall 2 days ago. EXAM: LEFT KNEE - COMPLETE 4+ VIEW COMPARISON:  No priors. FINDINGS: Four views of the left knee demonstrate postoperative changes of total knee arthroplasty. Both the femoral and tibial components of the prosthesis appear well seated without definite periprosthetic fracture or other acute complicating features. No acute displaced fracture noted in the visualized portions of the femur, proximal tibia or fibula. Patella is intact. Soft tissues are unremarkable. IMPRESSION: 1. No acute radiographic abnormality of the left knee. 2. Postop changes of left total knee arthroplasty. Electronically Signed   By: Trudie Reed M.D.   On: 03/09/2017 11:30   Dg Knee Complete 4 Views Right  Result Date: 03/09/2017 CLINICAL DATA:  83 year old female with history of bilateral knee pain after a fall 2 days ago. EXAM: RIGHT KNEE - COMPLETE 4+ VIEW COMPARISON:  Right knee radiograph 09/09/2004. FINDINGS: Four views of the right knee demonstrate postoperative changes of right total knee arthroplasty. Both the femoral and tibial components of the prosthesis appear well seated without definite periprosthetic fracture or other immediate complicating features. There is a small lucency adjacent to the lateral aspect of the tibial prosthesis, which appears minimally increased compared to prior study from 2006. No other lucency is noted to suggest significant hardware loosening. No acute displaced fracture in the visualized bones. Overlying soft tissues are unremarkable. IMPRESSION: 1. No acute radiographic abnormality of the right knee. 2. Status post right total knee arthroplasty. Small left chronic lucency adjacent to the lateral aspect of the tibial prosthesis, minimally increased compared to 2006. Electronically Signed   By: Trudie Reed M.D.   On: 03/09/2017 11:32    EKG:   Orders placed or performed in visit on  02/14/15  . EKG 12-Lead    IMPRESSION AND PLAN:  Principal Problem:   UTI (urinary tract infection) - IV antibiotics, culture sent Active Problems:   Essential hypertension - continue home meds   Type 2 diabetes mellitus (HCC) - sliding scale insulin with corresponding glucose checks   Hypercholesterolemia - continue home meds  All the records are reviewed and case discussed with ED provider. Management plans discussed with the patient and/or family.  DVT PROPHYLAXIS: SubQ lovenox  GI PROPHYLAXIS: None  ADMISSION STATUS: Inpatient  CODE STATUS: Full Code Status History    This patient  does not have a recorded code status. Please follow your organizational policy for patients in this situation.      TOTAL TIME TAKING CARE OF THIS PATIENT: 45 minutes.   Madisan Bice FIELDING 03/09/2017, 10:35 PM  [employer]  [phone]  CC: Primary care physician; Margaretann Loveless, PA-C  Note:  This document was prepared using Dragon voice recognition software and may include unintentional dictation errors.

## 2017-03-09 NOTE — Clinical Social Work Note (Signed)
CSW notified by ED MD that patient may need placement. ED MD stated that patient has been in a lot of pain and thus is not very mobile and that she has not had any narcotics tried as an attempt to resolve this pain. CSW advised the MD that patient has private insurance and that patient would have to have a PT assessment for insurance purposes and that if patient is in so much pain that she cannot participate in PT, then she will not be able to go to a SNF under her insurance because it would not be covered. MD verbalized understanding and states she has ordered PT.  York Spaniel MSW,LCSW [phone]

## 2017-03-09 NOTE — ED Notes (Signed)
Gave patient food tray 

## 2017-03-09 NOTE — ED Provider Notes (Signed)
[name] ____________________________________________   I have reviewed the triage vital signs and the triage nursing note.  HISTORY  Chief Complaint Knee Pain   Historian Patient, friend and daughter  HPI Sherry Brooks is a 83 y.o. female Lives at home alone, has multiple orthopedic chronic issues including bilateral knee replacements which give her chronic pain and arthritis, history of back surgery, history of left wrist surgery, presents today due to ongoing and worsening bilateral knee pains which is making it difficult to where she really cannot walk over the last several days. Next line next line family got her bedside commode so that she could potentially just get up to the bedside at night. She has help during the day, but not overnight as she lives alone. Next the next line she had a minor fall about a week ago, and it seemed like the pain was worse after that. She is referring the worst of the pain to the left knee.  She follows with Dr. Ernest Pine with orthopedics and has an appointment for surgery scheduled at the end of October, October 22, and also has plans to go to acute care rehabilitation and then [facility] after that.  Patient and family feel like she is unable to go home in this condition in terms that she is unable take care of herself because she cannot really walk.  patient is relatively okay at rest, but severe when she stands up and both knees but mostly the left knee.   Past Medical History:  Diagnosis Date  . Anxiety   . Arthritis    knees  . Borderline diabetic   . Complication of anesthesia    wakes up slowly, takes longer than usual to wake  . Depression   . Diabetes (HCC)   . GERD (gastroesophageal reflux disease)    on occas. uses TUMS for indigestion   . Heart murmur   . History of stress test    10 yrs. ago, states it was normal , had been ordered for pt. due to  stress related to husband's illness,, by Dr. Vic Ripper  . Hyperlipemia   . Hypertension   . Macular degeneration   . Peripheral vascular disease (HCC)    blood clots in legs mid 2000's   . Renal calculus    history of > 50 yrs. ago    Patient Active Problem List   Diagnosis Date Noted  . H/O diverticulitis of colon 07/29/2016  . Broken arm 07/29/2016  . Chronic venous insufficiency 04/22/2016  . Pain in limb 04/22/2016  . Abnormal finding on mammography 03/28/2015  . Adult BMI 30+ 03/28/2015  . Chronic kidney disease (CKD), stage III (moderate) (HCC) 03/28/2015  . Chronic LBP 03/28/2015  . Can't get food down 03/28/2015  . Lymphedema 03/28/2015  . Degeneration macular 03/28/2015  . Amnesia 03/28/2015  . Subclinical hypothyroidism 03/28/2015  . Hypercholesterolemia 03/28/2015  . Pain in the chest 02/14/2015  . SOB (shortness of breath) 02/14/2015  . Morbid obesity (HCC) 02/14/2015  . Unsteady gait 02/14/2015  . Primary osteoarthritis of both knees 02/14/2015  . Essential hypertension 02/14/2015  . Anxiety and depression 02/14/2015  . Joint pain 01/04/2014  . Bursitis, hip 10/19/2013  . Spondylolisthesis 10/28/2012  . Arthritis, degenerative 08/25/2009  . Type 2 diabetes mellitus (HCC) 07/03/2007  . Colon, diverticulosis 02/12/2001  . OP (osteoporosis) 02/12/2001    Past Surgical History:  Procedure Laterality Date  . APPENDECTOMY  as a teenager   . BIOPSY THYROID  06/2008  . COLON SURGERY     due to diverticulitis- had colostomy, then takedown   . EYE SURGERY     cataracts removed - /w IOL  . FRACTURE SURGERY     L wrist, plate in place  . JOINT REPLACEMENT     both knees   . SPINE SURGERY  10/23/2012   Spinal Fusion, lower back    Prior to Admission medications   Medication Sig Start Date End Date Taking? Authorizing Provider  acetaminophen (TYLENOL) 650 MG CR tablet Take 650 mg by mouth every 6 (six) hours.   Yes [provider]  ARTIFICIAL  TEAR SOLUTION OP Apply 1 drop to eye daily.    Yes [provider]  B Complex Vitamins (VITAMIN B COMPLEX PO) Take 1 tablet by mouth daily.    Yes [provider]  donepezil (ARICEPT) 5 MG tablet Take 1 tablet (5 mg total) by mouth at bedtime. 11/15/16  Yes Burnette, Victorino Dike M, PA-C  lisinopril-hydrochlorothiazide (PRINZIDE,ZESTORETIC) 20-25 MG tablet TAKE 1 TABLET BY MOUTH EVERY DAY. 04/01/16  Yes Malva Limes, MD  Multiple Minerals-Vitamins (CALCIUM-MAGNESIUM-ZINC-D3 PO) Take 1 tablet by mouth daily.   Yes [provider]  Multiple Vitamins-Minerals (PRESERVISION AREDS) CAPS Take 2 capsules by mouth daily.    Yes [provider]  Olopatadine HCl (PATADAY) 0.2 % SOLN Apply 1 drop to eye daily.    Yes [provider]  oxybutynin (DITROPAN-XL) 10 MG 24 hr tablet Take 1 tablet (10 mg total) by mouth at bedtime. 03/07/17  Yes Joycelyn Man M, PA-C  pravastatin (PRAVACHOL) 10 MG tablet TAKE 1 TABLET BY MOUTH EVERY NIGHT AT BEDTIME 12/24/16  Yes Margaretann Loveless, PA-C  Psyllium (METAMUCIL PO) Take 6-7 capsules by mouth daily.    Yes [provider]  sertraline (ZOLOFT) 50 MG tablet Take 1 tablet (50 mg total) by mouth daily. 11/15/16  Yes Margaretann Loveless, PA-C  vitamin E 400 UNIT capsule Take 400 Units by mouth daily.    Yes [provider]    No Known Allergies  Family History  Problem Relation Age of Onset  . Arthritis Mother   . Arthritis Father   . Bladder Cancer Neg Hx   . Kidney cancer Neg Hx   . Prostate cancer Neg Hx     Social History Social History  Substance Use Topics  . Smoking status: Never Smoker  . Smokeless tobacco: Never Used  . Alcohol use No     Comment: glass of wine, rare occas.     Review of Systems  Constitutional: Negative for fever. Eyes: Negative for visual changes. ENT: Negative for sore throat. Cardiovascular: Negative for chest pain. Respiratory: Negative for shortness of  breath. Gastrointestinal: Negative for abdominal pain, vomiting and diarrhea. Genitourinary: Negative for dysuria. Musculoskeletal: Negative for back pain.  bilateral knee pain as per history of present illness. Skin: Negative for rash. Neurological: Negative for headache.  ____________________________________________   PHYSICAL EXAM:  VITAL SIGNS: ED Triage Vitals  Enc Vitals Group     BP 03/09/17 1046 (!) 155/71     Pulse Rate 03/09/17 1046 63     Resp 03/09/17 1046 16     Temp 03/09/17 1037 98.1 F (36.7 C)     Temp Source 03/09/17 1037 Oral     SpO2 03/09/17 1046 99 %     Weight 03/09/17 1046 217 lb (98.4 kg)     Height 03/09/17  1046  (1.575 m)     Head Circumference --      Peak Flow --      Pain Score 03/09/17 1045 8     Pain Loc --      Pain Edu? --      Excl. in GC? --      Constitutional: Alert and oriented. Well appearing and in no distress. HEENT   Head: Normocephalic and atraumatic.      Eyes: Conjunctivae are normal. Pupils equal and round.       Ears:         Nose: No congestion/rhinnorhea.   Mouth/Throat: Mucous membranes are moist.   Neck: No stridor. Cardiovascular/Chest: Normal rate, regular rhythm.  No murmurs, rubs, or gallops. Respiratory: Normal respiratory effort without tachypnea nor retractions. Breath sounds are clear and equal bilaterally. No wheezes/rales/rhonchi. Gastrointestinal: Soft. No distention, no guarding, no rebound. Nontender.    Genitourinary/rectal:Deferred Musculoskeletal: pelvis is stable. Questionable left hip tenderness. Patient reports pain with low due to the fluid at both knees especially with standing which was not tested initially here in the ED but reported. No joint effusion or redness appreciated of the knee joint. Neurovascularly intact. No lower extremity edema or calf tenderness. Neurologic:  Normal speech and language. No gross or focal neurologic deficits are appreciated. Skin:  Skin is warm, dry  and intact. No rash noted. Psychiatric: Mood and affect are normal. Speech and behavior are normal. Patient exhibits appropriate insight and judgment.   ____________________________________________  LABS (pertinent positives/negatives) I, Governor Rooks, MD the attending physician have reviewed the labs noted below.  Labs Reviewed - No data to display  ____________________________________________    EKG I, Governor Rooks, MD, the attending physician have personally viewed and interpreted all ECGs.  none ____________________________________________  RADIOLOGY All Xrays were viewed by me.  Imaging interpreted by Radiologist, and I, Governor Rooks, MD the attending physician have reviewed the radiologist interpretation noted below.  x-ray pelvis:  IMPRESSION: No acute osseous injury of the pelvis. Given the patient's age and osteopenia, if there is persistent clinical concern for an occult hip fracture, a MRI of the hip is recommended for increased Sensitivity.   Right knee complete:   IMPRESSION: 1. No acute radiographic abnormality of the left knee. 2. Postop changes of left total knee arthroplasty.  left knee complete:   CLINICAL DATA: 83 year old female with history of bilateral knee pain after a fall 2 days ago.  EXAM: RIGHT KNEE - COMPLETE 4+ VIEW  COMPARISON: Right knee radiograph 09/09/2004.  FINDINGS: Four views of the right knee demonstrate postoperative changes of right total knee arthroplasty. Both the femoral and tibial components of the prosthesis appear well seated without definite periprosthetic fracture or other immediate complicating features. There is a small lucency adjacent to the lateral aspect of the tibial prosthesis, which appears minimally increased compared to prior study from 2006. No other lucency is noted to suggest significant hardware loosening. No acute displaced fracture in the visualized bones. Overlying soft tissues are  unremarkable.  IMPRESSION: 1. No acute radiographic abnormality of the right knee. 2. Status post right total knee arthroplasty. Small left chronic lucency adjacent to the lateral aspect of the tibial prosthesis, minimally increased compared to 2006.      __________________________________________  PROCEDURES  Procedure(s) performed: None  Critical Care performed: None  ____________________________________________  No current facility-administered medications on file prior to encounter.    Current Outpatient Prescriptions on File Prior to Encounter  Medication Sig Dispense  Refill  . ARTIFICIAL TEAR SOLUTION OP Apply 1 drop to eye daily.     . B Complex Vitamins (VITAMIN B COMPLEX PO) Take 1 tablet by mouth daily.     . donepezil (ARICEPMarland KitchenT) 5 MG tablet Take 1 tablet (5 mg total) by mouth at bedtime. 90 tablet 1  . lisinopril-hydrochlorothiazide (PRINZIDE,ZESTORETIC) 20-25 MG tablet TAKE 1 TABLET BY MOUTH EVERY DAY. 90 tablet 3  . Multiple Vitamins-Minerals (PRESERVISION AREDS) CAPS Take 2 capsules by mouth daily.     . Olopatadine HCl (PATADAY) 0.2 % SOLN Apply 1 drop to eye daily.     Marland Kitchen oxybutynin (DITROPAN-XL) 10 MG 24 hr tablet Take 1 tablet (10 mg total) by mouth at bedtime. 90 tablet 1  . pravastatin (PRAVACHOL) 10 MG tablet TAKE 1 TABLET BY MOUTH EVERY NIGHT AT BEDTIME 90 tablet 1  . Psyllium (METAMUCIL PO) Take 6-7 capsules by mouth daily.     . sertraline (ZOLOFT) 50 MG tablet Take 1 tablet (50 mg total) by mouth daily. 90 tablet 1  . vitamin E 400 UNIT capsule Take 400 Units by mouth daily.       ____________________________________________  ED COURSE / ASSESSMENT AND PLAN  Pertinent labs & imaging results that were available during my care of the patient were reviewed by me and considered in my medical decision making (see chart for details).   patient seemed initially most concerned about possible traumatic injury after minor fall about a week ago at the left  knee, however then stated it is actually both knees are really giving her trouble and where she almost cannot walk anymore. This is severely limiting given that she lives alone.  After x-rays were obtained and showed no acute traumatic finding or otherwise, friend and daughter arrived and were more concerned that the patient is unable to care for herself given her chronic orthopedic issues. I'm going to try narcotic pain medication she's only had tried Tylenol and ibuprofen at home to see if she get some relief enough to stand up. However am going to go ahead and consult social work and physical therapy for possibly the patient may need nursing home placement.  Patient care to be transferred at 3pm to Dr. Roxan Hockey.  Likely dispo to nursing home or as discussed with social work and PT.  DIFFERENTIAL DIAGNOSIS:including but not limited to arthritis, hardware malfunction, fracture, ligamentous injury, joint effusion, joint infection, neuropathy, etc.  CONSULTATIONS:   social work and physical therapy.   Patient / Family / Caregiver informed of clinical course, medical decision-making process, and agree with plan.  _______________   FINAL CLINICAL IMPRESSION(S) / ED DIAGNOSES   Final diagnoses:  Chronic pain of both knees  Acute pain of left knee              Note: This dictation was prepared with Dragon dictation. Any transcriptional errors that result from this process are unintentional    Governor Rooks, MD 03/09/17 1441

## 2017-03-09 NOTE — Evaluation (Signed)
Physical Therapy Evaluation Patient Details Name: Sherry Brooks MRN: [id number] DOB: 1929/02/17 Today's Date: 03/09/2017   History of Present Illness  Patient is an 83 y/o female that presents to the ED with complaints of inability to ambulate secondary to weakness. She has gradually declined over the last year, had a fall last Friday and has been unable to ambulate secondary to knee pain L>R since. X-rays in ED were negative for occult fx of pelvis or femurs.   Clinical Impression  Patient has had gradual decline over the past year, exacerbated by recent fall and now inability to bear weight sufficiently to ambulate. She is also demonstrating trunkal strength deficits as she requires significant assistance with bed mobility. She was able to perform short SLR on LLE at the end of the session when she began complaining her L knee area was going "numb", denied any sensory changes on exam, and reported some relief when she was reclined in the stretcher. Discussed with RN. Patient is not able to return home in her current state, she would likely benefit from parallel bars at a rehab facility, as it is weakness more than pain that is limiting her currently.       Follow Up Recommendations SNF    Equipment Recommendations  Rolling walker with 5" wheels    Recommendations for Other Services       Precautions / Restrictions Precautions Precautions: Fall Restrictions Weight Bearing Restrictions: No      Mobility  Bed Mobility Overal bed mobility: Needs Assistance Bed Mobility: Supine to Sit;Sit to Supine     Supine to sit: Mod assist Sit to supine: Max assist   General bed mobility comments: Patient demonstrates generalized weakness, trunkal weakness noted and inability to elevate torso off bed surface without significant PT assistance.   Transfers Overall transfer level: Needs assistance Equipment used: Rolling walker (2 wheeled) Transfers: Sit to/from Stand Sit to Stand: Min  assist         General transfer comment: Patient is able to perform standing transfer relatively well from elevated surface, forward trunk posture throughout.   Ambulation/Gait             General Gait Details: Patient attempted to perform side stepping, forward stepping, she is unable to shift weight onto RLE long enough to bring her LLE forward.   Stairs            Wheelchair Mobility    Modified Rankin (Stroke Patients Only)       Balance Overall balance assessment: Needs assistance;History of Falls Sitting-balance support: Bilateral upper extremity supported;Feet supported Sitting balance-Leahy Scale: Good     Standing balance support: Bilateral upper extremity supported Standing balance-Leahy Scale: Poor                               Pertinent Vitals/Pain Pain Assessment: Faces Faces Pain Scale: Hurts a little bit Pain Location: Bilateral knees  Pain Descriptors / Indicators: Aching Pain Intervention(s): Limited activity within patient's tolerance;Monitored during session;Premedicated before session    Home Living Family/patient expects to be discharged to:: Private residence Living Arrangements: Alone Available Help at Discharge: Family;Available PRN/intermittently Type of Home: House Home Access: Stairs to enter Entrance Stairs-Rails: Can reach both Entrance Stairs-Number of Steps: 3 Home Layout: One level Home Equipment: Walker - 2 wheels;Bedside commode;Cane - single point      Prior Function Level of Independence: Independent with assistive device(s)  Comments: Patient had been ambulatory in the house with a RW, however she had a fall on Friday and is having difficulty ambulating since that time.      Hand Dominance   Dominant Hand: Right    Extremity/Trunk Assessment   Upper Extremity Assessment Upper Extremity Assessment: Generalized weakness    Lower Extremity Assessment Lower Extremity Assessment:  Generalized weakness (Sensory WNL bilateraly throughout session, LLE SLR roughly half the height of RLE)       Communication   Communication: No difficulties  Cognition Arousal/Alertness: Awake/alert Behavior During Therapy: WFL for tasks assessed/performed Overall Cognitive Status: Within Functional Limits for tasks assessed                                        General Comments General comments (skin integrity, edema, etc.): Scars of TKAs in the past bilaterally, swelling noted in her bilateral LEs/calves.     Exercises     Assessment/Plan    PT Assessment Patient needs continued PT services  PT Problem List Decreased strength;Pain;Decreased activity tolerance;Decreased balance;Decreased safety awareness;Decreased mobility;Obesity       PT Treatment Interventions DME instruction;Gait training;Balance training;Therapeutic exercise;Stair training;Neuromuscular re-education;Functional mobility training;Cognitive remediation;Therapeutic activities;Patient/family education    PT Goals (Current goals can be found in the Care Plan section)  Acute Rehab PT Goals Patient Stated Goal: To get stronger and more mobile  PT Goal Formulation: With patient/family Time For Goal Achievement: 03/23/17 Potential to Achieve Goals: Fair    Frequency Min 2X/week   Barriers to discharge Decreased caregiver support;Inaccessible home environment Patient lives alone without caregiver support 24/7 and is not able to ambulate currently.     Co-evaluation               AM-PAC PT "6 Clicks" Daily Activity  Outcome Measure Difficulty turning over in bed (including adjusting bedclothes, sheets and blankets)?: A Lot Difficulty moving from lying on back to sitting on the side of the bed? : A Lot Difficulty sitting down on and standing up from a chair with arms (e.g., wheelchair, bedside commode, etc,.)?: A Lot Help needed moving to and from a bed to chair (including a  wheelchair)?: Total Help needed walking in hospital room?: Total Help needed climbing 3-5 steps with a railing? : Total 6 Click Score: 9    End of Session Equipment Utilized During Treatment: Gait belt Activity Tolerance: Patient tolerated treatment well;Patient limited by fatigue Patient left: in bed;with call bell/phone within reach;with family/visitor present Nurse Communication: Mobility status PT Visit Diagnosis: Unsteadiness on feet (R26.81);Difficulty in walking, not elsewhere classified (R26.2)    Time: 1610-9604 PT Time Calculation (min) (ACUTE ONLY): 22 min   Charges:   PT Evaluation $PT Eval Moderate Complexity: 1 Mod     PT G Codes:   PT G-Codes **NOT FOR INPATIENT CLASS** Functional Assessment Tool Used: AM-PAC 6 Clicks Basic Mobility;Clinical judgement Functional Limitation: Mobility: Walking and moving around Mobility: Walking and Moving Around Current Status (V4098): At least 80 percent but less than 100 percent impaired, limited or restricted Mobility: Walking and Moving Around Goal Status [id number]): At least 60 percent but less than 80 percent impaired, limited or restricted   Alva Garnet PT, DPT, CSCS    03/09/2017, 4:06 PM

## 2017-03-09 NOTE — ED Triage Notes (Signed)
Pt arrived from home via [facility] with complaints of pain in both knees. Pt states that left knee hurting more than right. Left knee surgery scheduled for Oct 22nd. EMS reports pt fell at home on Friday and pt has not been able to walk since falling.

## 2017-03-09 NOTE — ED Notes (Signed)
External female catheter in place. 

## 2017-03-09 NOTE — ED Notes (Signed)
PT informed of consult order.

## 2017-03-09 NOTE — ED Provider Notes (Signed)
workup reveals nitrite positive clear urinary tract infection. This is likely causing generalized weakness and decreased ability to perform her ADLs and care for herself in the setting of the acute illness. Urine culture sent. IV ceftriaxone. Case discussed with hospitalist for further management.  Final diagnoses:  Chronic pain of both knees  Acute pain of left knee  Lower urinary tract infection  Generalized weakness      Sharman Cheek, MD 03/09/17 2248

## 2017-03-09 NOTE — ED Notes (Signed)
Patient was changed, linens were changed. Patient was changed into a gown and given a cup of ice water and graham crackers. NAD.

## 2017-03-09 NOTE — ED Notes (Signed)
Assisted PT to reposition the patient. Patient c/o numbness in Left lower leg.

## 2017-03-09 NOTE — ED Notes (Signed)
Changed brief on patient

## 2017-03-09 NOTE — Clinical Social Work Note (Signed)
Clinical Social Work Assessment  Patient Details  Name: Sherry Brooks MRN: [id number] Date of Birth: 11/05/1928  Date of referral:  03/09/17               Reason for consult:  Facility Placement                Permission sought to share information with:  Facility Medical sales representative, Family Supports Permission granted to share information::  Yes, Verbal Permission Granted  Name::        Agency::     Relationship::     Contact Information:     Housing/Transportation Living arrangements for the past 2 months:  Single Family Home Source of Information:  Patient, Adult Children Patient Interpreter Needed:  None Criminal Activity/Legal Involvement Pertinent to Current Situation/Hospitalization:  No - Comment as needed Significant Relationships:  Adult Children Lives with:  Self Do you feel safe going back to the place where you live?   (no due to physical limitations) Need for family participation in patient care:  Yes (Comment)  Care giving concerns:  Patient admitted from home to the ED due to pain and inability to ambulate.    Social Worker assessment / plan:  PT was able to assess patient late this afternoon and recommended STR. CSW spoke with patient and [name]: [phone] and explained role and purpose of visit. Patient was scheduled for an ortho surgery on 10/22 and was to go to [facility] the daughter in law said after her surgery and then from there she was to go to [facility] ALF. She states she has already put down a deposit for the room at [facility]. Patient has medicare BCBS and will require prior auth in order to go to rehab. Information for prior auth will be sent in to insurance tomorrow morning for review. Bed search initiated today. CSW discussed with patient and daughter in law that if insurance denies, the choice would be to private pay in a nursing home or to return back home with caregiver support. Patient's daughter in  law wanted to know if she could go ahead and go to [facility] early and CSW explained the difference in the levels of care between a SNF and an ALF and that it would not be possible for her to go to [facility] now if she cannot ambulate.   Employment status:  Retired Database administrator PT Recommendations:  Skilled Nursing Facility Information / Referral to community resources:     Patient/Family's Response to care:  Patient and daughter in law expressed understanding.   Patient/Family's Understanding of and Emotional Response to Diagnosis, Current Treatment, and Prognosis:  Patient's daughter in law is very involved with patient's care and is very knowledgeable.   Emotional Assessment Appearance:  Appears stated age Attitude/Demeanor/Rapport:   (pleasant but not wanting to remain in the ED while placement is sought) Affect (typically observed):  Calm Orientation:  Oriented to Self, Oriented to Place, Oriented to  Time, Oriented to Situation Alcohol / Substance use:  Not Applicable Psych involvement (Current and /or in the community):  No (Comment)  Discharge Needs  Concerns to be addressed:  Care Coordination Readmission within the last 30 days:    Current discharge risk:  None Barriers to Discharge:  No Barriers Identified   York Spaniel, LCSW 03/09/2017, 4:55 PM

## 2017-03-09 NOTE — Progress Notes (Signed)
Pharmacy Antibiotic Note  Sherry Brooks is a 83 y.o. female admitted on 03/09/2017 with UTI.  Pharmacy has been consulted for ceftriaxone dosing.  Plan: Ceftriaxone 2 grams q 24 hours ordered.  Height:  (157.5 cm) Weight: 217 lb (98.4 kg) IBW/kg (Calculated) : 50.1  Temp (24hrs), Avg:98.1 F (36.7 C), Min:98.1 F (36.7 C), Max:98.1 F (36.7 C)   Recent Labs Lab 03/09/17 1834  WBC 7.6  CREATININE 0.87    Estimated Creatinine Clearance: 50.9 mL/min (by C-G formula based on SCr of 0.87 mg/dL).    No Known Allergies  Antimicrobials this admission: Ceftriaxone   >>    >>   Dose adjustments this admission:   Microbiology results: 10/7 UCx: pending       10/7 LE(+) NO2(+) WBC TNTC Thank you for allowing pharmacy to be a part of this patient's care.  Hilary Milks S 03/09/2017 11:34 PM

## 2017-03-10 DIAGNOSIS — E785 Hyperlipidemia, unspecified: Secondary | ICD-10-CM | POA: Diagnosis not present

## 2017-03-10 DIAGNOSIS — E119 Type 2 diabetes mellitus without complications: Secondary | ICD-10-CM | POA: Diagnosis not present

## 2017-03-10 DIAGNOSIS — I10 Essential (primary) hypertension: Secondary | ICD-10-CM | POA: Diagnosis not present

## 2017-03-10 DIAGNOSIS — N390 Urinary tract infection, site not specified: Secondary | ICD-10-CM | POA: Diagnosis not present

## 2017-03-10 LAB — CBC
HCT: 33.6 % — ABNORMAL LOW (ref 35.0–47.0)
Hemoglobin: 11.6 g/dL — ABNORMAL LOW (ref 12.0–16.0)
MCH: 29.2 pg (ref 26.0–34.0)
MCHC: 34.7 g/dL (ref 32.0–36.0)
MCV: 84 fL (ref 80.0–100.0)
PLATELETS: 290 10*3/uL (ref 150–440)
RBC: 4 MIL/uL (ref 3.80–5.20)
RDW: 14.2 % (ref 11.5–14.5)
WBC: 7.3 10*3/uL (ref 3.6–11.0)

## 2017-03-10 LAB — BASIC METABOLIC PANEL
Anion gap: 6 (ref 5–15)
BUN: 19 mg/dL (ref 6–20)
CHLORIDE: 103 mmol/L (ref 101–111)
CO2: 27 mmol/L (ref 22–32)
CREATININE: 0.66 mg/dL (ref 0.44–1.00)
Calcium: 8.8 mg/dL — ABNORMAL LOW (ref 8.9–10.3)
GFR calc Af Amer: >60 mL/min (ref >60)
GFR calc non Af Amer: >60 mL/min (ref >60)
GLUCOSE: 116 mg/dL — AB (ref 65–99)
Potassium: 3.9 mmol/L (ref 3.5–5.1)
Sodium: 136 mmol/L (ref 135–145)

## 2017-03-10 NOTE — Clinical Social Work Note (Signed)
CSW presented bed offers to patient and her family and they chose [facility] in [place].  CSW faxed required clinical information awaiting for insurance authorization.  Ervin Knack. Collier Bohnet, MSW, Theresia Majors [phone]  03/10/2017 1:16 PM

## 2017-03-10 NOTE — Progress Notes (Signed)
[employer] [facility] [place] [facility]   PATIENT NAME: Sherry Brooks    MR#:  [id number]  DATE OF BIRTH:  1928/07/02  SUBJECTIVE:   Patient here due to generalized weakness also noted a urinary tract infection. Remains afebrile, denies any urinary frequency, dysuria or any other associated symptoms. Patient's family is at bedside.  REVIEW OF SYSTEMS:    Review of Systems  Constitutional: Negative for chills and fever.  HENT: Negative for congestion and tinnitus.   Eyes: Negative for blurred vision and double vision.  Respiratory: Negative for cough, shortness of breath and wheezing.   Cardiovascular: Negative for chest pain, orthopnea and PND.  Gastrointestinal: Negative for abdominal pain, diarrhea, nausea and vomiting.  Genitourinary: Negative for dysuria and hematuria.  Neurological: Positive for weakness. Negative for dizziness, sensory change and focal weakness.  All other systems reviewed and are negative.   Nutrition: Heart Healthy/Carb modified Tolerating Diet: Yes Tolerating PT: Eval noted.   DRUG ALLERGIES:  No Known Allergies  VITALS:  Blood pressure (!) 153/53, pulse (!) 57, temperature 98 F (36.7 C), temperature source Oral, resp. rate 16, height  (1.575 m), weight 98.4 kg (217 lb), SpO2 98 %.  PHYSICAL EXAMINATION:   Physical Exam  GENERAL:  83 y.o.-year-old patient lying in bed in no acute distress.  EYES: Pupils equal, round, reactive to light and accommodation. No scleral icterus. Extraocular muscles intact.  HEENT: Head atraumatic, normocephalic. Oropharynx and nasopharynx clear.  NECK:  Supple, no jugular venous distention. No thyroid enlargement, no tenderness.  LUNGS: Normal breath sounds bilaterally, no wheezing, rales, rhonchi. No use of accessory muscles of respiration.  CARDIOVASCULAR: S1, S2 normal. No murmurs, rubs, or gallops.  ABDOMEN: Soft, nontender, nondistended. Bowel sounds present. No organomegaly or mass.   EXTREMITIES: No cyanosis, clubbing, +1 edema b/l NEUROLOGIC: Cranial nerves II through XII are intact. No focal Motor or sensory deficits b/l. Globally weak  PSYCHIATRIC: The patient is alert and oriented x 3.  SKIN: No obvious rash, lesion, or ulcer.    LABORATORY PANEL:   CBC  Recent Labs Lab 03/10/17 0406  WBC 7.3  HGB 11.6*  HCT 33.6*  PLT 290   ------------------------------------------------------------------------------------------------------------------  Chemistries   Recent Labs Lab 03/10/17 0406  NA 136  K 3.9  CL 103  CO2 27  GLUCOSE 116*  BUN 19  CREATININE 0.66  CALCIUM 8.8*   ------------------------------------------------------------------------------------------------------------------  Cardiac Enzymes No results for input(s): TROPONINI in the last 168 hours. ------------------------------------------------------------------------------------------------------------------  RADIOLOGY:  Dg Pelvis 1-2 Views  Result Date: 03/09/2017 CLINICAL DATA:  Fall 2 days ago, pt unable to walk since. Bilateral knee pain, imaging performed earlier today. EXAM: PELVIS - 1-2 VIEW COMPARISON:  None. FINDINGS: Generalized osteopenia. No acute fracture or dislocation. Moderate osteoarthritis of bilateral hips, right greater than left. IMPRESSION: No acute osseous injury of the pelvis. Given the patient's age and osteopenia, if there is persistent clinical concern for an occult hip fracture, a MRI of the hip is recommended for increased sensitivity. Electronically Signed   By: Elige Ko   On: 03/09/2017 13:35   Dg Knee Complete 4 Views Left  Result Date: 03/09/2017 CLINICAL DATA:  83 year old female with history of bilateral knee pain after a fall 2 days ago. EXAM: LEFT KNEE - COMPLETE 4+ VIEW COMPARISON:  No priors. FINDINGS: Four views of the left knee demonstrate postoperative changes of total knee arthroplasty. Both the femoral and tibial components of the  prosthesis appear well seated without definite periprosthetic fracture  or other acute complicating features. No acute displaced fracture noted in the visualized portions of the femur, proximal tibia or fibula. Patella is intact. Soft tissues are unremarkable. IMPRESSION: 1. No acute radiographic abnormality of the left knee. 2. Postop changes of left total knee arthroplasty. Electronically Signed   By: Trudie Reed M.D.   On: 03/09/2017 11:30   Dg Knee Complete 4 Views Right  Result Date: 03/09/2017 CLINICAL DATA:  83 year old female with history of bilateral knee pain after a fall 2 days ago. EXAM: RIGHT KNEE - COMPLETE 4+ VIEW COMPARISON:  Right knee radiograph 09/09/2004. FINDINGS: Four views of the right knee demonstrate postoperative changes of right total knee arthroplasty. Both the femoral and tibial components of the prosthesis appear well seated without definite periprosthetic fracture or other immediate complicating features. There is a small lucency adjacent to the lateral aspect of the tibial prosthesis, which appears minimally increased compared to prior study from 2006. No other lucency is noted to suggest significant hardware loosening. No acute displaced fracture in the visualized bones. Overlying soft tissues are unremarkable. IMPRESSION: 1. No acute radiographic abnormality of the right knee. 2. Status post right total knee arthroplasty. Small left chronic lucency adjacent to the lateral aspect of the tibial prosthesis, minimally increased compared to 2006. Electronically Signed   By: Trudie Reed M.D.   On: 03/09/2017 11:32     ASSESSMENT AND PLAN:   83 year old female with past medical history of dementia, osteoarthritis, macular degeneration, previous history of peripheral vascular disease, GERD, diabetes, depression who presented to the hospital due to generalized weakness noted to have a urinary tract infection.  1. Generalized weakness-secondary to underlying severe  osteoarthritis and also combined with no underlying urinary tract infection. -Continue IV ceftriaxone for the UTI. Seen by physical therapy and recommended short-term rehabilitation and social worker wherein working on placement.  2. Urinary tract infection-continue IV ceftriaxone, follow urine cultures. She is clinically afebrile and hemodynamically stable with a normal white cell count.  3. History of dementia-continue Aricept.  4. Depression-continue Zoloft.  5. Hyperlipidemia-continue Pravachol.  Possible discharge to short-term rehabilitation tomorrow once insurance authorization received.   All the records are reviewed and case discussed with Care Management/Social Worker. Management plans discussed with the patient, family and they are in agreement.  CODE STATUS: Full code  DVT Prophylaxis: Lovenox  TOTAL TIME TAKING CARE OF THIS PATIENT: 30 minutes.   POSSIBLE D/C IN 1-2 DAYS, DEPENDING ON CLINICAL CONDITION.   Houston Siren M.D on 03/10/2017 at 2:31 PM  Between 7am to 6pm - Pager - [phone]  After 6pm go to [email or web address] - Scientist, research (life sciences) [facility]  Office  [phone]  CC: Primary care physician; Margaretann Loveless, PA-C

## 2017-03-10 NOTE — Progress Notes (Signed)
Physical Therapy Treatment Patient Details Name: Sherry Brooks MRN: [id number] DOB: [birth year] Today's Date: 03/10/2017    History of Present Illness Patient is an 83 y/o female that presents to the ED with complaints of inability to ambulate secondary to weakness. She has gradually declined over the last year, had a fall last Friday and has been unable to ambulate secondary to knee pain L>R since. X-rays in ED were negative for occult fx of pelvis or femurs.     PT Comments    Pt able to progress to ambulating 5 feet with RW min assist x1 with increased time and effort.  Mobility continues to appear to be more limited d/t weakness than pain.  Will continue to focus on strengthening, balance, and progressive functional mobility training per pt tolerance.    Follow Up Recommendations  SNF     Equipment Recommendations  Rolling walker with 5" wheels    Recommendations for Other Services       Precautions / Restrictions Precautions Precautions: Fall Restrictions Weight Bearing Restrictions: No    Mobility  Bed Mobility Overal bed mobility: Needs Assistance Bed Mobility: Supine to Sit     Supine to sit: Min assist;Mod assist;HOB elevated     General bed mobility comments: increased effort and time to sit up near edge of bed but required min to mod assist to scoot forward onto edge of bed  Transfers Overall transfer level: Needs assistance Equipment used: Rolling walker (2 wheeled) Transfers: Sit to/from [employer] Sit to Stand: Min assist Stand pivot transfers: Min assist (stand step turn bed to recliner)       General transfer comment: assist to initiate stand; vc's for UE and LE placement; increased effort and time to perform  Ambulation/Gait Ambulation/Gait assistance: Min assist Ambulation Distance (Feet): 5 Feet Assistive device: Rolling walker (2 wheeled)   Gait velocity: decreased   General Gait Details: decreased stance time L LE;  decreased R LE step length (requiring a few attempts to advance R LE in order to reach L LE each time)   Stairs            Wheelchair Mobility    Modified Rankin (Stroke Patients Only)       Balance Overall balance assessment: Needs assistance;History of Falls Sitting-balance support: Bilateral upper extremity supported;Feet supported Sitting balance-Leahy Scale: Fair Sitting balance - Comments: static sitting   Standing balance support: Bilateral upper extremity supported (on RW) Standing balance-Leahy Scale: Fair Standing balance comment: static standing with B UE support on RW                            Cognition Arousal/Alertness: Awake/alert Behavior During Therapy: WFL for tasks assessed/performed Overall Cognitive Status: Within Functional Limits for tasks assessed                                        Exercises      General Comments   Nursing cleared pt for participation in physical therapy.  Pt agreeable to PT session.      Pertinent Vitals/Pain Pain Assessment: Faces Faces Pain Scale: Hurts little more Pain Location: L>R knee Pain Descriptors / Indicators: Sore;Aching Pain Intervention(s): Limited activity within patient's tolerance;Monitored during session;Repositioned  Vitals (HR and O2 on room air) stable and WFL throughout treatment session.    Home Living  Prior Function            PT Goals (current goals can now be found in the care plan section) Acute Rehab PT Goals Patient Stated Goal: To get stronger and more mobile  PT Goal Formulation: With patient/family Time For Goal Achievement: 03/23/17 Potential to Achieve Goals: Fair Progress towards PT goals: Progressing toward goals    Frequency    Min 2X/week      PT Plan Current plan remains appropriate    Co-evaluation              AM-PAC PT "6 Clicks" Daily Activity  Outcome Measure  Difficulty turning over in  bed (including adjusting bedclothes, sheets and blankets)?: A Lot Difficulty moving from lying on back to sitting on the side of the bed? : Unable Difficulty sitting down on and standing up from a chair with arms (e.g., wheelchair, bedside commode, etc,.)?: Unable Help needed moving to and from a bed to chair (including a wheelchair)?: A Little Help needed walking in hospital room?: A Lot Help needed climbing 3-5 steps with a railing? : Total 6 Click Score: 10    End of Session Equipment Utilized During Treatment: Gait belt Activity Tolerance: Patient limited by fatigue Patient left: in chair;with call bell/phone within reach;with chair alarm set;with nursing/sitter in room (B heels elevated via pillow) Nurse Communication: Mobility status;Precautions PT Visit Diagnosis: Unsteadiness on feet (R26.81);Difficulty in walking, not elsewhere classified (R26.2);Muscle weakness (generalized) (M62.81)     Time: 4696-2952 PT Time Calculation (min) (ACUTE ONLY): 39 min  Charges:  $Gait Training: 8-22 mins $Therapeutic Activity: 23-37 mins                    G CodesHendricks Limes, PT 03/10/17, 2:30 PM [phone]

## 2017-03-10 NOTE — Progress Notes (Signed)
Pt resting quietly in bed with external catheter in place. No acute distress noted

## 2017-03-11 DIAGNOSIS — E1151 Type 2 diabetes mellitus with diabetic peripheral angiopathy without gangrene: Secondary | ICD-10-CM | POA: Diagnosis present

## 2017-03-11 DIAGNOSIS — G8929 Other chronic pain: Secondary | ICD-10-CM | POA: Diagnosis present

## 2017-03-11 DIAGNOSIS — Z96653 Presence of artificial knee joint, bilateral: Secondary | ICD-10-CM | POA: Diagnosis present

## 2017-03-11 DIAGNOSIS — M1990 Unspecified osteoarthritis, unspecified site: Secondary | ICD-10-CM | POA: Diagnosis present

## 2017-03-11 DIAGNOSIS — H3530 Unspecified macular degeneration: Secondary | ICD-10-CM | POA: Diagnosis present

## 2017-03-11 DIAGNOSIS — F0390 Unspecified dementia without behavioral disturbance: Secondary | ICD-10-CM | POA: Diagnosis present

## 2017-03-11 DIAGNOSIS — N390 Urinary tract infection, site not specified: Secondary | ICD-10-CM | POA: Diagnosis not present

## 2017-03-11 DIAGNOSIS — M6281 Muscle weakness (generalized): Secondary | ICD-10-CM | POA: Diagnosis not present

## 2017-03-11 DIAGNOSIS — M25561 Pain in right knee: Secondary | ICD-10-CM | POA: Diagnosis present

## 2017-03-11 DIAGNOSIS — B9620 Unspecified Escherichia coli [E. coli] as the cause of diseases classified elsewhere: Secondary | ICD-10-CM | POA: Diagnosis present

## 2017-03-11 DIAGNOSIS — I10 Essential (primary) hypertension: Secondary | ICD-10-CM | POA: Diagnosis not present

## 2017-03-11 DIAGNOSIS — F329 Major depressive disorder, single episode, unspecified: Secondary | ICD-10-CM | POA: Diagnosis present

## 2017-03-11 DIAGNOSIS — Z981 Arthrodesis status: Secondary | ICD-10-CM | POA: Diagnosis not present

## 2017-03-11 DIAGNOSIS — Z7401 Bed confinement status: Secondary | ICD-10-CM | POA: Diagnosis not present

## 2017-03-11 DIAGNOSIS — K219 Gastro-esophageal reflux disease without esophagitis: Secondary | ICD-10-CM | POA: Diagnosis present

## 2017-03-11 DIAGNOSIS — E119 Type 2 diabetes mellitus without complications: Secondary | ICD-10-CM | POA: Diagnosis not present

## 2017-03-11 DIAGNOSIS — E785 Hyperlipidemia, unspecified: Secondary | ICD-10-CM | POA: Diagnosis not present

## 2017-03-11 DIAGNOSIS — Z87442 Personal history of urinary calculi: Secondary | ICD-10-CM | POA: Diagnosis not present

## 2017-03-11 DIAGNOSIS — M25562 Pain in left knee: Secondary | ICD-10-CM | POA: Diagnosis present

## 2017-03-11 MED ORDER — CEFUROXIME AXETIL 250 MG PO TABS: 250.0000 mg | ORAL_TABLET | Freq: Two times a day (BID) | ORAL | 0 refills | Status: DC

## 2017-03-11 MED ORDER — BISACODYL 10 MG RE SUPP
10.0000 mg | Freq: Once | RECTAL | Status: AC
  Administered 2017-03-11: 10 mg via RECTAL
  Filled 2017-03-11: qty 1

## 2017-03-11 NOTE — Clinical Social Work Placement (Signed)
   CLINICAL SOCIAL WORK PLACEMENT  NOTE  Date:  03/11/2017  Patient Details  Name: Sherry Brooks MRN: [id number] Date of Birth: May 31, 1929  Clinical Social Work is seeking post-discharge placement for this patient at the Skilled  Nursing Facility level of care (*CSW will initial, date and re-position this form in  chart as items are completed):  Yes   Patient/family provided with [place] Clinical Social Work Department's list of facilities offering this level of care within the geographic area requested by the patient (or if unable, by the patient's family).  Yes   Patient/family informed of their freedom to choose among providers that offer the needed level of care, that participate in Medicare, Medicaid or managed care program needed by the patient, have an available bed and are willing to accept the patient.  Yes   Patient/family informed of [facility]'s ownership interest in [facility] and [facility], as well as of the fact that they are under no obligation to receive care at these facilities.  PASRR submitted to EDS on       PASRR number received on       Existing PASRR number confirmed on 03/09/17     FL2 transmitted to all facilities in geographic area requested by pt/family on 03/09/17     FL2 transmitted to all facilities within larger geographic area on       Patient informed that his/her managed care company has contracts with or will negotiate with certain facilities, including the following:        Yes   Patient/family informed of bed offers received.  Patient chooses bed at  [facility] )     Physician recommends and patient chooses bed at      Patient to be transferred to  [facility] ) on 03/11/17.  Patient to be transferred to facility by  [facility] )     Patient family notified on 03/11/17 of transfer.  Name of family member notified:   (Patient's [name] is aware of D/C today. )     PHYSICIAN       Additional Comment:     _______________________________________________ Kynsie Falkner, Darleen Crocker, LCSW 03/11/2017, 2:55 PM

## 2017-03-11 NOTE — NC FL2 (Signed)
[place] MEDICAID FL2 LEVEL OF CARE SCREENING TOOL     IDENTIFICATION  Patient Name: Sherry Brooks Birthdate: 1928-11-16 Sex: female Admission Date (Current Location): 03/09/2017  [place] and [place] Number:  Chiropodist and Address:  [facility], [address], [place], [place] [address]      Provider Number: [id number]  Attending Physician Name and Address:  Houston Siren, MD  Relative Name and Phone Number:       Current Level of Care: SNF Recommended Level of Care: Skilled Nursing Facility Prior Approval Number:    Date Approved/Denied:   PASRR Number:   [id number]  Discharge Plan: SNF    Current Diagnoses: Patient Active Problem List   Diagnosis Date Noted  . UTI (urinary tract infection) 03/09/2017  . H/O diverticulitis of colon 07/29/2016  . Broken arm 07/29/2016  . Chronic venous insufficiency 04/22/2016  . Pain in limb 04/22/2016  . Abnormal finding on mammography 03/28/2015  . Adult BMI 30+ 03/28/2015  . Chronic kidney disease (CKD), stage III (moderate) (HCC) 03/28/2015  . Chronic LBP 03/28/2015  . Can't get food down 03/28/2015  . Lymphedema 03/28/2015  . Degeneration macular 03/28/2015  . Amnesia 03/28/2015  . Subclinical hypothyroidism 03/28/2015  . Hypercholesterolemia 03/28/2015  . Pain in the chest 02/14/2015  . SOB (shortness of breath) 02/14/2015  . Morbid obesity (HCC) 02/14/2015  . Unsteady gait 02/14/2015  . Primary osteoarthritis of both knees 02/14/2015  . Essential hypertension 02/14/2015  . Anxiety and depression 02/14/2015  . Joint pain 01/04/2014  . Bursitis, hip 10/19/2013  . Spondylolisthesis 10/28/2012  . Arthritis, degenerative 08/25/2009  . Type 2 diabetes mellitus (HCC) 07/03/2007  . Colon, diverticulosis 02/12/2001  . OP (osteoporosis) 02/12/2001    Orientation RESPIRATION BLADDER Height & Weight     Self, Time, Situation, Place  Normal Incontinent Weight: 217 lb (98.4  kg) Height:   (157.5 cm)  BEHAVIORAL SYMPTOMS/MOOD NEUROLOGICAL BOWEL NUTRITION STATUS   (none)  (none) Continent Diet (Heart Healthy)  AMBULATORY STATUS COMMUNICATION OF NEEDS Skin   Extensive Assist Verbally Normal                       Personal Care Assistance Level of Assistance  Bathing, Feeding, Dressing Bathing Assistance: Limited assistance Feeding assistance: Independent Dressing Assistance: Limited assistance Total Care Assistance: Maximum assistance   Functional Limitations Info  Sight, Hearing, Speech Sight Info: Impaired Hearing Info: Adequate Speech Info: Adequate    SPECIAL CARE FACTORS FREQUENCY  PT (By licensed PT), OT (By licensed OT)     PT Frequency:  (5) OT Frequency:  (5)            Contractures Contractures Info: Not present    Additional Factors Info  Code Status, Allergies Code Status Info:  (Full Code) Allergies Info:  (No Known Allergies)           Current Medications (03/11/2017):  This is the current hospital active medication list Current Facility-Administered Medications  Medication Dose Route Frequency Provider Last Rate Last Dose  . acetaminophen (TYLENOL) tablet 650 mg  650 mg Oral Q6H PRN Oralia Manis, MD       Or  . acetaminophen (TYLENOL) suppository 650 mg  650 mg Rectal Q6H PRN Oralia Manis, MD      . cefTRIAXone (ROCEPHIN) 2 g in dextrose 5 % 50 mL IVPB  2 g Intravenous Q24H Oralia Manis, MD 100 mL/hr at 03/10/17 1700 2 g at 03/10/17  1700  . donepezil (ARICEPT) tablet 5 mg  5 mg Oral Lamont Snowball, MD   5 mg at 03/10/17 2030  . enoxaparin (LOVENOX) injection 40 mg  40 mg Subcutaneous Q24H Oralia Manis, MD   40 mg at 03/10/17 2032  . ondansetron (ZOFRAN) tablet 4 mg  4 mg Oral Q6H PRN Oralia Manis, MD       Or  . ondansetron [facility]) injection 4 mg  4 mg Intravenous Q6H PRN Oralia Manis, MD      . pravastatin (PRAVACHOL) tablet 10 mg  10 mg Oral Lamont Snowball, MD   10 mg at 03/10/17 2030  .  sertraline (ZOLOFT) tablet 50 mg  50 mg Oral Daily Oralia Manis, MD   50 mg at 03/10/17 1000     Discharge Medications: Please see discharge summary for a list of discharge medications.  Relevant Imaging Results:  Relevant Lab Results:   Additional Information  (SSN: [id number])  Payton Spark, Student-Social Work

## 2017-03-11 NOTE — Progress Notes (Signed)
Nurse page [facility] for pt in Rm 151. Pt was down, tearful, and did not want to go to therapy facility. Pt needed emotional support. [place] met pt at 2:56, but was paged to see another pt in 1C. CH made a follow up with pt at 4:20pm. Pt talked about where she grew up, she talked about loss of her son in 6 who died of motorcycle accident, her husband in 2005, and a strained relationship with her daughter. Pt stated she has a daughter-in-law and 10 grandchildren. Pt was grieving loss of her loved ones. She stated that she is partial blind and was afraid she might not be able to walk again. Not being able to see and walk made pt sad and tearful. [place] listened to pt, offered emotional support and pastoral presence. Pt to be moved to rehab facility this PM.    03/11/17 1800  Clinical Encounter Type  Visited With Patient;Health care provider  Visit Type Initial;Follow-up;Spiritual support;Social support  Referral From Nurse  Consult/Referral To Chaplain  Spiritual Encounters  Spiritual Needs Other (Comment)

## 2017-03-11 NOTE — Care Management CC44 (Signed)
Condition Code 44 Documentation Completed  Patient Details  Name: Sherry Brooks MRN: [id number] Date of Birth: 07/18/1928   Condition Code 44 given:  Yes Patient signature on Condition Code 44 notice:  Yes Documentation of 2 MD's agreement:  Yes Code 44 added to claim:  Yes    Marily Memos, RN 03/11/2017, 10:45 AM

## 2017-03-11 NOTE — Progress Notes (Signed)
Patient is being discharged to [facility].  Report called. A&O  X 4, but forgetful. LBM this shift. Notified [name], daughter-in-law of transport. IV removed with cath intact. Waiting for transport at this time.

## 2017-03-11 NOTE — Progress Notes (Signed)
Clinical Child psychotherapist (CSW) started [employer] SNF authorization through [employer] today. Plan is for patient to D/C to [facility] when stable and authorization is received.   [employer], LCSW [phone]

## 2017-03-11 NOTE — Discharge Summary (Signed)
[facility] [place] [facility]   PATIENT NAME: Sherry Brooks    MR#:  [id number]  DATE OF BIRTH:  [birth year]  DATE OF ADMISSION:  03/09/2017 ADMITTING PHYSICIAN: Oralia Manis, MD  DATE OF DISCHARGE: 03/11/2017  PRIMARY CARE PHYSICIAN: Margaretann Loveless, PA-C    ADMISSION DIAGNOSIS:  Lower urinary tract infection [N39.0] Generalized weakness [R53.1] Acute pain of left knee [M25.562] Chronic pain of both knees [M25.561, M25.562, G89.29]  DISCHARGE DIAGNOSIS:  Principal Problem:   UTI (urinary tract infection) Active Problems:   Essential hypertension   Type 2 diabetes mellitus (HCC)   Hypercholesterolemia   SECONDARY DIAGNOSIS:   Past Medical History:  Diagnosis Date  . Anxiety   . Arthritis    knees  . Borderline diabetic   . Complication of anesthesia    wakes up slowly, takes longer than usual to wake  . Depression   . Diabetes (HCC)   . GERD (gastroesophageal reflux disease)    on occas. uses TUMS for indigestion   . Heart murmur   . History of stress test    10 yrs. ago, states it was normal , had been ordered for pt. due to stress related to husband's illness,, by Dr. Vic Ripper  . Hyperlipemia   . Hypertension   . Macular degeneration   . Peripheral vascular disease (HCC)    blood clots in legs mid 2000's   . Renal calculus    history of > 50 yrs. ago    HOSPITAL COURSE:   83 year old female with past medical history of dementia, osteoarthritis, macular degeneration, previous history of peripheral vascular disease, GERD, diabetes, depression who presented to the hospital due to generalized weakness noted to have a urinary tract infection.  1. Generalized weakness-secondary to underlying severe osteoarthritis and also combined with  underlying urinary tract infection. -Patient was treated with IV ceftriaxone for UTI and also seen by physical therapy who recommended short-term rehabilitation. Patient is presently being  discharged to short-term rehabilitation now.  2. Urinary tract infection-patient was treated with IV ceftriaxone, urine cultures are positive for Escherichia coli but the sensitivities are pending. She is clinically stable and asymptomatic and therefore not being discharged on oral Ceftin.  3. History of dementia- pt. Will continue Aricept.  4. Depression- Pt. Will continue Zoloft.  5. Hyperlipidemia- pt. Will continue Pravachol.  DISCHARGE CONDITIONS:   Stable.   CONSULTS OBTAINED:    DRUG ALLERGIES:  No Known Allergies  DISCHARGE MEDICATIONS:   Allergies as of 03/11/2017   No Known Allergies     Medication List    TAKE these medications   acetaminophen 650 MG CR tablet Commonly known as:  TYLENOL Take 650 mg by mouth every 6 (six) hours.   ARTIFICIAL TEAR SOLUTION OP Apply 1 drop to eye daily.   CALCIUM-MAGNESIUM-ZINC-D3 PO Take 1 tablet by mouth daily.   cefUROXime 250 MG tablet Commonly known as:  CEFTIN Take 1 tablet (250 mg total) by mouth 2 (two) times daily with a meal.   donepezil 5 MG tablet Commonly known as:  ARICEPT Take 1 tablet (5 mg total) by mouth at bedtime.   lisinopril-hydrochlorothiazide 20-25 MG tablet Commonly known as:  PRINZIDE,ZESTORETIC TAKE 1 TABLET BY MOUTH EVERY DAY.   METAMUCIL PO Take 6-7 capsules by mouth daily.   oxybutynin 10 MG 24 hr tablet Commonly known as:  DITROPAN-XL Take 1 tablet (10 mg total) by mouth at bedtime.   PATADAY 0.2 % Soln Generic drug:  Olopatadine HCl  Apply 1 drop to eye daily.   pravastatin 10 MG tablet Commonly known as:  PRAVACHOL TAKE 1 TABLET BY MOUTH EVERY NIGHT AT BEDTIME   PRESERVISION AREDS Caps Take 2 capsules by mouth daily.   sertraline 50 MG tablet Commonly known as:  ZOLOFT Take 1 tablet (50 mg total) by mouth daily.   VITAMIN B COMPLEX PO Take 1 tablet by mouth daily.   vitamin E 400 UNIT capsule Take 400 Units by mouth daily.         DISCHARGE INSTRUCTIONS:    DIET:  Cardiac diet  DISCHARGE CONDITION:  Stable  ACTIVITY:  Activity as tolerated  OXYGEN:  Home Oxygen: No.   Oxygen Delivery: room air  DISCHARGE LOCATION:  nursing home   If you experience worsening of your admission symptoms, develop shortness of breath, life threatening emergency, suicidal or homicidal thoughts you must seek medical attention immediately by calling 911 or calling your MD immediately  if symptoms less severe.  You Must read complete instructions/literature along with all the possible adverse reactions/side effects for all the Medicines you take and that have been prescribed to you. Take any new Medicines after you have completely understood and accpet all the possible adverse reactions/side effects.   Please note  You were cared for by a hospitalist during your hospital stay. If you have any questions about your discharge medications or the care you received while you were in the hospital after you are discharged, you can call the unit and asked to speak with the hospitalist on call if the hospitalist that took care of you is not available. Once you are discharged, your primary care physician will handle any further medical issues. Please note that NO REFILLS for any discharge medications will be authorized once you are discharged, as it is imperative that you return to your primary care physician (or establish a relationship with a primary care physician if you do not have one) for your aftercare needs so that they can reassess your need for medications and monitor your lab values.     Today   No acute events overnight.  No complaints. Will d/c to SNF today.   VITAL SIGNS:  Blood pressure (!) 145/70, pulse 65, temperature 97.9 F (36.6 C), temperature source Oral, resp. rate 16, height  (1.575 m), weight 98.4 kg (217 lb), SpO2 97 %.  I/O:    Intake/Output Summary (Last 24 hours) at 03/11/17 1359 Last data filed at 03/11/17 1300  Gross per 24  hour  Intake              530 ml  Output             1401 ml  Net             -871 ml    PHYSICAL EXAMINATION:   GENERAL:  83 y.o.-year-old patient lying in bed in no acute distress.  EYES: Pupils equal, round, reactive to light and accommodation. No scleral icterus. Extraocular muscles intact.  HEENT: Head atraumatic, normocephalic. Oropharynx and nasopharynx clear.  NECK:  Supple, no jugular venous distention. No thyroid enlargement, no tenderness.  LUNGS: Normal breath sounds bilaterally, no wheezing, rales, rhonchi. No use of accessory muscles of respiration.  CARDIOVASCULAR: S1, S2 normal. No murmurs, rubs, or gallops.  ABDOMEN: Soft, nontender, nondistended. Bowel sounds present. No organomegaly or mass.  EXTREMITIES: No cyanosis, clubbing, +1 edema b/l NEUROLOGIC: Cranial nerves II through XII are intact. No focal Motor or sensory deficits b/l. Globally  weak  PSYCHIATRIC: The patient is alert and oriented x 3.  SKIN: No obvious rash, lesion, or ulcer.    DATA REVIEW:   CBC  Recent Labs Lab 03/10/17 0406  WBC 7.3  HGB 11.6*  HCT 33.6*  PLT 290    Chemistries   Recent Labs Lab 03/10/17 0406  NA 136  K 3.9  CL 103  CO2 27  GLUCOSE 116*  BUN 19  CREATININE 0.66  CALCIUM 8.8*    Cardiac Enzymes No results for input(s): TROPONINI in the last 168 hours.  Microbiology Results  Results for orders placed or performed during the hospital encounter of 03/09/17  Urine Culture     Status: Abnormal (Preliminary result)   Collection Time: 03/09/17  7:21 PM  Result Value Ref Range Status   Specimen Description URINE, RANDOM  Final   Special Requests NONE  Final   Culture >=100,000 COLONIES/mL ESCHERICHIA COLI (A)  Final   Report Status PENDING  Incomplete    RADIOLOGY:  No results found.    Management plans discussed with the patient, family and they are in agreement.  CODE STATUS:     Code Status Orders        Start     Ordered   03/09/17 2314   Full code  Continuous     03/09/17 2313    Code Status History    Date Active Date Inactive Code Status Order ID Comments User Context   This patient has a current code status but no historical code status.    Advance Directive Documentation     Most Recent Value  Type of Advance Directive  Healthcare Power of Attorney, Living will  Pre-existing out of facility DNR order (yellow form or pink MOST form)  -  "MOST" Form in Place?  -      TOTAL TIME TAKING CARE OF THIS PATIENT: 40 minutes.    Houston Siren M.D on 03/11/2017 at 1:59 PM  Between 7am to 6pm - Pager - [phone]  After 6pm go to [email or web address] - Scientist, research (life sciences) [facility]  Office  [phone]  CC: Primary care physician; Margaretann Loveless, PA-C

## 2017-03-11 NOTE — Care Management Obs Status (Signed)
MEDICARE OBSERVATION STATUS NOTIFICATION   Patient Details  Name: Sherry Brooks MRN: [id number] Date of Birth: 13-Jun-1928   Medicare Observation Status Notification Given:  Yes    Marily Memos, RN 03/11/2017, 10:45 AM

## 2017-03-11 NOTE — Progress Notes (Signed)
[facility] Medicare SNF authorization has been received, auth # [id number], RVB. Patient is medically stable for D/C to [facility] today. Per [facility] admissions coordinator at [facility] patient can come today to room E-5. RN will call report and arrange EMS for transport. RN has agreed to call patient's daughter in law [name] when EMS arrives. Clinical Child psychotherapist (CSW) sent D/C orders to [facility] via [employer]. Patient is aware of above. CSW contacted patient's daughter in law [name] at patient's request and made her aware of above. Please reconsult if future social work needs arise. CSW signing off.   [employer], LCSW [phone]

## 2017-03-11 NOTE — Clinical Social Work Placement (Signed)
   CLINICAL SOCIAL WORK PLACEMENT  NOTE  Date:  03/11/2017  Patient Details  Name: Sherry Brooks MRN: [id number] Date of Birth: Jul 21, 1928  Clinical Social Work is seeking post-discharge placement for this patient at the Skilled  Nursing Facility level of care (*CSW will initial, date and re-position this form in  chart as items are completed):  Yes   Patient/family provided with [facility] Clinical Social Work Department's list of facilities offering this level of care within the geographic area requested by the patient (or if unable, by the patient's family).  Yes   Patient/family informed of their freedom to choose among providers that offer the needed level of care, that participate in Medicare, Medicaid or managed care program needed by the patient, have an available bed and are willing to accept the patient.  Yes   Patient/family informed of [facility]'s ownership interest in [facility] and [facility], as well as of the fact that they are under no obligation to receive care at these facilities.  PASRR submitted to EDS on       PASRR number received on       Existing PASRR number confirmed on 03/09/17     FL2 transmitted to all facilities in geographic area requested by pt/family on 03/09/17     FL2 transmitted to all facilities within larger geographic area on       Patient informed that his/her managed care company has contracts with or will negotiate with certain facilities, including the following:        Yes   Patient/family informed of bed offers received.  Patient chooses bed at  [facility] )     Physician recommends and patient chooses bed at      Patient to be transferred to   on  .  Patient to be transferred to facility by       Patient family notified on   of transfer.  Name of family member notified:        PHYSICIAN       Additional Comment:    _______________________________________________ [facility], Darleen Crocker, LCSW 03/11/2017,  11:14 AM

## 2017-03-12 LAB — URINE CULTURE

## 2017-03-14 ENCOUNTER — Encounter
Admission: RE | Admit: 2017-03-14 | Discharge: 2017-03-14 | Disposition: A | Discharge status: Home / Self Care | Payer: Medicare Other | Source: Ambulatory Visit | Attending: Orthopedic Surgery | Admitting: Orthopedic Surgery

## 2017-03-14 DIAGNOSIS — Z9889 Other specified postprocedural states: Secondary | ICD-10-CM | POA: Diagnosis not present

## 2017-03-14 DIAGNOSIS — Z79899 Other long term (current) drug therapy: Secondary | ICD-10-CM | POA: Diagnosis not present

## 2017-03-14 DIAGNOSIS — Z01812 Encounter for preprocedural laboratory examination: Secondary | ICD-10-CM | POA: Diagnosis not present

## 2017-03-14 DIAGNOSIS — I10 Essential (primary) hypertension: Secondary | ICD-10-CM | POA: Insufficient documentation

## 2017-03-14 DIAGNOSIS — I447 Left bundle-branch block, unspecified: Secondary | ICD-10-CM | POA: Diagnosis not present

## 2017-03-14 DIAGNOSIS — Z01810 Encounter for preprocedural cardiovascular examination: Secondary | ICD-10-CM | POA: Diagnosis not present

## 2017-03-14 DIAGNOSIS — T84033D Mechanical loosening of internal left knee prosthetic joint, subsequent encounter: Secondary | ICD-10-CM | POA: Diagnosis not present

## 2017-03-14 DIAGNOSIS — E119 Type 2 diabetes mellitus without complications: Secondary | ICD-10-CM | POA: Diagnosis not present

## 2017-03-14 HISTORY — DX: Personal history of urinary calculi: Z87.442

## 2017-03-14 LAB — PROTIME-INR
INR: 0.97
PROTHROMBIN TIME: 12.8 s (ref 11.4–15.2)

## 2017-03-14 LAB — TYPE AND SCREEN
ABO/RH(D): A POS
ANTIBODY SCREEN: NEGATIVE

## 2017-03-14 LAB — COMPREHENSIVE METABOLIC PANEL
ALT: 18 U/L (ref 14–54)
AST: 21 U/L (ref 15–41)
Albumin: 3.7 g/dL (ref 3.5–5.0)
Alkaline Phosphatase: 71 U/L (ref 38–126)
Anion gap: 10 (ref 5–15)
BILIRUBIN TOTAL: 0.6 mg/dL (ref 0.3–1.2)
BUN: 33 mg/dL — AB (ref 6–20)
CO2: 26 mmol/L (ref 22–32)
Calcium: 9.9 mg/dL (ref 8.9–10.3)
Chloride: 99 mmol/L — ABNORMAL LOW (ref 101–111)
Creatinine, Ser: 0.77 mg/dL (ref 0.44–1.00)
GFR calc Af Amer: >60 mL/min (ref >60)
Glucose, Bld: 109 mg/dL — ABNORMAL HIGH (ref 65–99)
POTASSIUM: 4.2 mmol/L (ref 3.5–5.1)
Sodium: 135 mmol/L (ref 135–145)
TOTAL PROTEIN: 7.4 g/dL (ref 6.5–8.1)

## 2017-03-14 LAB — URINALYSIS, ROUTINE W REFLEX MICROSCOPIC
BILIRUBIN URINE: NEGATIVE
GLUCOSE, UA: NEGATIVE mg/dL
HGB URINE DIPSTICK: NEGATIVE
Ketones, ur: NEGATIVE mg/dL
Leukocytes, UA: NEGATIVE
Nitrite: NEGATIVE
PH: 6 (ref 5.0–8.0)
Protein, ur: NEGATIVE mg/dL
SPECIFIC GRAVITY, URINE: 1.016 (ref 1.005–1.030)

## 2017-03-14 LAB — APTT: aPTT: 30 seconds (ref 24–36)

## 2017-03-14 LAB — CBC
HEMATOCRIT: 38.3 % (ref 35.0–47.0)
Hemoglobin: 13 g/dL (ref 12.0–16.0)
MCH: 29.4 pg (ref 26.0–34.0)
MCHC: 34.1 g/dL (ref 32.0–36.0)
MCV: 86.2 fL (ref 80.0–100.0)
Platelets: 346 10*3/uL (ref 150–440)
RBC: 4.44 MIL/uL (ref 3.80–5.20)
RDW: 14.2 % (ref 11.5–14.5)
WBC: 7.9 10*3/uL (ref 3.6–11.0)

## 2017-03-14 LAB — SEDIMENTATION RATE: SED RATE: 57 mm/h — AB (ref 0–30)

## 2017-03-14 LAB — SURGICAL PCR SCREEN
MRSA, PCR: NEGATIVE
Staphylococcus aureus: NEGATIVE

## 2017-03-14 NOTE — Patient Instructions (Addendum)
Your procedure is scheduled on: Mon. 10-/22/18 Report to Day Surgery. To find out your arrival time please call [phone] between 1PM - 3PM on Friday.03/21/17  Remember: Instructions that are not followed completely may result in serious medical risk, up to and including death, or upon the discretion of your surgeon and anesthesiologist your surgery may need to be rescheduled.     _X__ 1. Do not eat food after midnight the night before your procedure.                 No gum chewing or hard candies. You may drink clear liquids up to 2 hours                 before you are scheduled to arrive for your surgery- DO not drink clear                 liquids within 2 hours of the start of your surgery.                 Clear Liquids include:  water, apple juice without pulp, clear carbohydrate                 drink such as Clearfast of Gartorade, Black Coffee or Tea (Do not add                 anything to coffee or tea).     ___ 2.  No Alcohol for 24 hours before or after surgery.   ___ 3.  Do Not Smoke or use e-cigarettes For 24 Hours Prior to Your Surgery.                 Do not use any chewable tobacco products for at least 6 hours prior to                 surgery.  ____  4.  Bring all medications with you on the day of surgery if instructed.   _x___  5.  Notify your doctor if there is any change in your medical condition      (cold, fever, infections).     Do not wear jewelry, make-up, hairpins, clips or nail polish. Do not wear lotions, powders, or perfumes. You may wear deodorant. Do not shave 48 hours prior to surgery. Men may shave face and neck. Do not bring valuables to the hospital.    [facility] is not responsible for any belongings or valuables.  Contacts, dentures or bridgework may not be worn into surgery. Leave your suitcase in the car. After surgery it may be brought to your room. For patients admitted to the hospital, discharge time is determined  by your treatment team.   Patients discharged the day of surgery will not be allowed to drive home.   Please read over the following fact sheets that you were given:   MRSA Information  x ____ Take these medicines the morning of surgery with A SIP OF WATER:   2. Olopatadine HCl (PATADAY) 0.2 % SOLN  3. sertraline (ZOLOFT) 50 MG tablet  4.donepezil (ARICEPT) 5 MG tablet  5.pravastatin (PRAVACHOL) 10 MG tablet  6.  ____ Fleet Enema (as directed)   __x__ Use CHG Soap as directed  ____ Use inhalers on the day of surgery  ____ Stop metformin 2 days prior to surgery    ____ Take 1/2 of usual insulin dose the night before surgery. No insulin the morning  of surgery.   ____ Stop Coumadin/Plavix/aspirin on   ____ Stop Anti-inflammatories on   __x__ Stop supplements until after surgery.  vitamin E 400 UNIT capsule on 03/17/17  ____ Bring C-Pap to the hospital.

## 2017-03-15 LAB — C-REACTIVE PROTEIN

## 2017-03-17 LAB — URINE CULTURE
Culture: 30000 — AB
Special Requests: NORMAL

## 2017-03-17 NOTE — Pre-Procedure Instructions (Signed)
Faxed abnormal ESR and BUN results to Dr [employer] office.

## 2017-03-18 ENCOUNTER — Telehealth: Payer: Self-pay | Admitting: Physician Assistant

## 2017-03-18 DIAGNOSIS — M6281 Muscle weakness (generalized): Secondary | ICD-10-CM | POA: Diagnosis not present

## 2017-03-18 DIAGNOSIS — R262 Difficulty in walking, not elsewhere classified: Secondary | ICD-10-CM | POA: Diagnosis not present

## 2017-03-18 DIAGNOSIS — M25561 Pain in right knee: Secondary | ICD-10-CM | POA: Diagnosis not present

## 2017-03-18 DIAGNOSIS — N390 Urinary tract infection, site not specified: Secondary | ICD-10-CM | POA: Diagnosis not present

## 2017-03-18 DIAGNOSIS — M25562 Pain in left knee: Secondary | ICD-10-CM | POA: Diagnosis not present

## 2017-03-18 DIAGNOSIS — Z736 Limitation of activities due to disability: Secondary | ICD-10-CM | POA: Diagnosis not present

## 2017-03-18 DIAGNOSIS — R531 Weakness: Secondary | ICD-10-CM | POA: Diagnosis not present

## 2017-03-18 NOTE — Telephone Encounter (Signed)
Per [name] patient is in a nursing home right know.Patient is having surgery on Monday and is going to check with them and let us know first.  FYI: I did informed [name] that phone's are not working incase she try to reach. Per patient she will send a message in [place].  Thanks,  -Maryana Pittmon

## 2017-03-18 NOTE — Pre-Procedure Instructions (Signed)
Abnormal Urine culture results faxed to Dr Elenor Legato  office

## 2017-03-18 NOTE — Telephone Encounter (Signed)
Please call patient and let her daughter in law know that Oxybutynin  was denied by insurance. We can give  take 2 pills together if patient agreeable.

## 2017-03-21 DIAGNOSIS — R300 Dysuria: Secondary | ICD-10-CM | POA: Diagnosis not present

## 2017-03-21 DIAGNOSIS — M1712 Unilateral primary osteoarthritis, left knee: Secondary | ICD-10-CM | POA: Diagnosis not present

## 2017-03-24 ENCOUNTER — Inpatient Hospital Stay: Payer: Medicare Other | Admitting: Anesthesiology

## 2017-03-24 ENCOUNTER — Encounter: Payer: Self-pay | Admitting: Orthopedic Surgery

## 2017-03-24 ENCOUNTER — Encounter
Admission: RE | Disposition: A | Discharge status: Skilled Nursing Facility | Payer: Self-pay | Source: Ambulatory Visit | Attending: Orthopedic Surgery

## 2017-03-24 ENCOUNTER — Inpatient Hospital Stay: Payer: Medicare Other

## 2017-03-24 ENCOUNTER — Inpatient Hospital Stay
Admission: RE | Admit: 2017-03-24 | Discharge: 2017-03-27 | DRG: 468 | Disposition: A | Discharge status: Skilled Nursing Facility | Payer: Medicare Other | Source: Ambulatory Visit | Attending: Orthopedic Surgery | Admitting: Orthopedic Surgery

## 2017-03-24 DIAGNOSIS — E7800 Pure hypercholesterolemia, unspecified: Secondary | ICD-10-CM | POA: Diagnosis not present

## 2017-03-24 DIAGNOSIS — I10 Essential (primary) hypertension: Secondary | ICD-10-CM | POA: Diagnosis present

## 2017-03-24 DIAGNOSIS — E119 Type 2 diabetes mellitus without complications: Secondary | ICD-10-CM | POA: Diagnosis not present

## 2017-03-24 DIAGNOSIS — K219 Gastro-esophageal reflux disease without esophagitis: Secondary | ICD-10-CM | POA: Diagnosis present

## 2017-03-24 DIAGNOSIS — Y792 Prosthetic and other implants, materials and accessory orthopedic devices associated with adverse incidents: Secondary | ICD-10-CM | POA: Diagnosis present

## 2017-03-24 DIAGNOSIS — H3530 Unspecified macular degeneration: Secondary | ICD-10-CM | POA: Diagnosis not present

## 2017-03-24 DIAGNOSIS — Z6838 Body mass index (BMI) 38.0-38.9, adult: Secondary | ICD-10-CM | POA: Diagnosis not present

## 2017-03-24 DIAGNOSIS — Z7401 Bed confinement status: Secondary | ICD-10-CM | POA: Diagnosis not present

## 2017-03-24 DIAGNOSIS — E1151 Type 2 diabetes mellitus with diabetic peripheral angiopathy without gangrene: Secondary | ICD-10-CM | POA: Diagnosis not present

## 2017-03-24 DIAGNOSIS — Z96653 Presence of artificial knee joint, bilateral: Secondary | ICD-10-CM | POA: Diagnosis present

## 2017-03-24 DIAGNOSIS — E039 Hypothyroidism, unspecified: Secondary | ICD-10-CM | POA: Diagnosis not present

## 2017-03-24 DIAGNOSIS — M67862 Other specified disorders of synovium, left knee: Secondary | ICD-10-CM | POA: Diagnosis not present

## 2017-03-24 DIAGNOSIS — E785 Hyperlipidemia, unspecified: Secondary | ICD-10-CM | POA: Diagnosis present

## 2017-03-24 DIAGNOSIS — T84033A Mechanical loosening of internal left knee prosthetic joint, initial encounter: Principal | ICD-10-CM | POA: Diagnosis present

## 2017-03-24 DIAGNOSIS — Z471 Aftercare following joint replacement surgery: Secondary | ICD-10-CM | POA: Diagnosis not present

## 2017-03-24 DIAGNOSIS — Z96652 Presence of left artificial knee joint: Secondary | ICD-10-CM | POA: Diagnosis not present

## 2017-03-24 DIAGNOSIS — M25562 Pain in left knee: Secondary | ICD-10-CM | POA: Diagnosis not present

## 2017-03-24 DIAGNOSIS — Z96659 Presence of unspecified artificial knee joint: Secondary | ICD-10-CM

## 2017-03-24 HISTORY — PX: TOTAL KNEE REVISION: SHX996

## 2017-03-24 LAB — GLUCOSE, CAPILLARY
GLUCOSE-CAPILLARY: 113 mg/dL — AB (ref 65–99)
GLUCOSE-CAPILLARY: 85 mg/dL (ref 65–99)

## 2017-03-24 LAB — ABO/RH: ABO/RH(D): A POS

## 2017-03-24 SURGERY — TOTAL KNEE REVISION
Anesthesia: Spinal | Site: Knee | Laterality: Left | Wound class: Clean

## 2017-03-24 MED ORDER — NEOMYCIN-POLYMYXIN B GU 40-200000 IR SOLN
Status: AC
  Filled 2017-03-24: qty 20

## 2017-03-24 MED ORDER — ACETAMINOPHEN 325 MG PO TABS
650.0000 mg | ORAL_TABLET | ORAL | Status: DC | PRN
  Administered 2017-03-25: 650 mg via ORAL
  Filled 2017-03-24 (×2): qty 2

## 2017-03-24 MED ORDER — FENTANYL CITRATE (PF) 100 MCG/2ML IJ SOLN
INTRAMUSCULAR | Status: DC | PRN
  Administered 2017-03-24 (×2): 50 ug via INTRAVENOUS

## 2017-03-24 MED ORDER — FENTANYL CITRATE (PF) 100 MCG/2ML IJ SOLN
INTRAMUSCULAR | Status: AC
  Administered 2017-03-24: 25 ug via INTRAVENOUS
  Filled 2017-03-24: qty 2

## 2017-03-24 MED ORDER — SERTRALINE HCL 50 MG PO TABS
50.0000 mg | ORAL_TABLET | Freq: Every day | ORAL | Status: DC
  Administered 2017-03-25 – 2017-03-27 (×3): 50 mg via ORAL
  Filled 2017-03-24 (×3): qty 1

## 2017-03-24 MED ORDER — MINERAL OIL LIGHT 100 % EX OIL
TOPICAL_OIL | CUTANEOUS | Status: AC
  Filled 2017-03-24: qty 25

## 2017-03-24 MED ORDER — BUPIVACAINE-EPINEPHRINE (PF) 0.25% -1:200000 IJ SOLN
INTRAMUSCULAR | Status: DC | PRN
  Administered 2017-03-24: 60 mL

## 2017-03-24 MED ORDER — ALUM & MAG HYDROXIDE-SIMETH 200-200-20 MG/5ML PO SUSP: 30.0000 mL | ORAL | Status: DC | PRN

## 2017-03-24 MED ORDER — ONDANSETRON HCL 4 MG/2ML IJ SOLN: 4.0000 mg | Freq: Four times a day (QID) | INTRAMUSCULAR | Status: DC | PRN

## 2017-03-24 MED ORDER — BUPIVACAINE HCL (PF) 0.5 % IJ SOLN
INTRAMUSCULAR | Status: DC | PRN
  Administered 2017-03-24: 2.5 mL

## 2017-03-24 MED ORDER — HYDRALAZINE HCL 20 MG/ML IJ SOLN
10.0000 mg | Freq: Four times a day (QID) | INTRAMUSCULAR | Status: DC | PRN
Start: 2017-03-24 — End: 2017-03-27

## 2017-03-24 MED ORDER — DIPHENHYDRAMINE HCL 12.5 MG/5ML PO ELIX: 12.5000 mg | ORAL_SOLUTION | ORAL | Status: DC | PRN

## 2017-03-24 MED ORDER — BISACODYL 10 MG RE SUPP
10.0000 mg | Freq: Every day | RECTAL | Status: DC | PRN
Start: 2017-03-24 — End: 2017-03-27

## 2017-03-24 MED ORDER — METOCLOPRAMIDE HCL 10 MG PO TABS
10.0000 mg | ORAL_TABLET | Freq: Three times a day (TID) | ORAL | Status: AC
  Administered 2017-03-24 – 2017-03-25 (×5): 10 mg via ORAL
  Filled 2017-03-24 (×6): qty 1

## 2017-03-24 MED ORDER — ACETAMINOPHEN 10 MG/ML IV SOLN
INTRAVENOUS | Status: DC | PRN
  Administered 2017-03-24: 1000 mg via INTRAVENOUS

## 2017-03-24 MED ORDER — BUPIVACAINE HCL (PF) 0.25 % IJ SOLN
INTRAMUSCULAR | Status: AC
  Filled 2017-03-24: qty 20

## 2017-03-24 MED ORDER — SODIUM CHLORIDE 0.9 % IV SOLN
INTRAVENOUS | Status: DC
  Administered 2017-03-24: 10:00:00 via INTRAVENOUS

## 2017-03-24 MED ORDER — SODIUM CHLORIDE 0.9 % IJ SOLN
INTRAMUSCULAR | Status: AC
  Filled 2017-03-24: qty 50

## 2017-03-24 MED ORDER — ACETAMINOPHEN 650 MG RE SUPP: 650.0000 mg | RECTAL | Status: DC | PRN

## 2017-03-24 MED ORDER — FLEET ENEMA 7-19 GM/118ML RE ENEM: 1.0000 | ENEMA | Freq: Once | RECTAL | Status: DC | PRN

## 2017-03-24 MED ORDER — ONDANSETRON HCL 4 MG PO TABS: 4.0000 mg | ORAL_TABLET | Freq: Four times a day (QID) | ORAL | Status: DC | PRN

## 2017-03-24 MED ORDER — PROPOFOL 10 MG/ML IV BOLUS
INTRAVENOUS | Status: DC | PRN
  Administered 2017-03-24: 18 mg via INTRAVENOUS
  Administered 2017-03-24 (×2): 9 mg via INTRAVENOUS

## 2017-03-24 MED ORDER — BUPIVACAINE-EPINEPHRINE (PF) 0.25% -1:200000 IJ SOLN
INTRAMUSCULAR | Status: AC
Start: 2017-03-24 — End: ?
  Filled 2017-03-24: qty 30

## 2017-03-24 MED ORDER — PROPOFOL 500 MG/50ML IV EMUL
INTRAVENOUS | Status: AC
  Filled 2017-03-24: qty 50

## 2017-03-24 MED ORDER — DEXAMETHASONE SODIUM PHOSPHATE 4 MG/ML IJ SOLN
INTRAMUSCULAR | Status: DC | PRN
  Administered 2017-03-24: 5 mg via INTRAVENOUS

## 2017-03-24 MED ORDER — LISINOPRIL 20 MG PO TABS
20.0000 mg | ORAL_TABLET | Freq: Every day | ORAL | Status: DC
  Administered 2017-03-24 – 2017-03-27 (×4): 20 mg via ORAL
  Filled 2017-03-24 (×4): qty 1

## 2017-03-24 MED ORDER — KETAMINE HCL 50 MG/ML IJ SOLN
INTRAMUSCULAR | Status: DC | PRN
  Administered 2017-03-24: 25 mg via INTRAMUSCULAR

## 2017-03-24 MED ORDER — HYDROCHLOROTHIAZIDE 25 MG PO TABS
25.0000 mg | ORAL_TABLET | Freq: Every day | ORAL | Status: DC
  Administered 2017-03-24 – 2017-03-27 (×4): 25 mg via ORAL
  Filled 2017-03-24 (×4): qty 1

## 2017-03-24 MED ORDER — VITAMIN B COMPLEX PO TABS: 1.0000 | ORAL_TABLET | Freq: Every day | ORAL | Status: DC

## 2017-03-24 MED ORDER — CHLORHEXIDINE GLUCONATE 4 % EX LIQD: 60.0000 mL | Freq: Once | CUTANEOUS | Status: DC

## 2017-03-24 MED ORDER — ADULT MULTIVITAMIN W/MINERALS CH
1.0000 | ORAL_TABLET | Freq: Every day | ORAL | Status: DC
  Administered 2017-03-24 – 2017-03-27 (×4): 1 via ORAL
  Filled 2017-03-24 (×5): qty 1

## 2017-03-24 MED ORDER — FERROUS SULFATE 325 (65 FE) MG PO TABS
325.0000 mg | ORAL_TABLET | Freq: Two times a day (BID) | ORAL | Status: DC
Start: 2017-03-24 — End: 2017-03-27
  Administered 2017-03-25 – 2017-03-27 (×5): 325 mg via ORAL
  Filled 2017-03-24 (×5): qty 1

## 2017-03-24 MED ORDER — DEXTROSE 5 % IV SOLN
2.0000 g | Freq: Four times a day (QID) | INTRAVENOUS | Status: AC
  Administered 2017-03-24 – 2017-03-25 (×4): 2 g via INTRAVENOUS
  Filled 2017-03-24 (×4): qty 2000

## 2017-03-24 MED ORDER — TOBRAMYCIN SULFATE 1.2 G IJ SOLR
INTRAMUSCULAR | Status: AC
  Filled 2017-03-24: qty 1.2

## 2017-03-24 MED ORDER — TRAMADOL HCL 50 MG PO TABS: 50.0000 mg | ORAL_TABLET | ORAL | Status: DC | PRN

## 2017-03-24 MED ORDER — PRAVASTATIN SODIUM 10 MG PO TABS
10.0000 mg | ORAL_TABLET | Freq: Every day | ORAL | Status: DC
  Administered 2017-03-24 – 2017-03-26 (×3): 10 mg via ORAL
  Filled 2017-03-24 (×4): qty 1

## 2017-03-24 MED ORDER — FAMOTIDINE 20 MG PO TABS
ORAL_TABLET | ORAL | Status: AC
  Filled 2017-03-24: qty 1

## 2017-03-24 MED ORDER — CEFAZOLIN SODIUM-DEXTROSE 2-4 GM/100ML-% IV SOLN
2.0000 g | INTRAVENOUS | Status: AC
  Administered 2017-03-24: 2 g via INTRAVENOUS

## 2017-03-24 MED ORDER — TETRACAINE HCL 1 % IJ SOLN
INTRAMUSCULAR | Status: DC | PRN
  Administered 2017-03-24: 5 mg via INTRASPINAL

## 2017-03-24 MED ORDER — FENTANYL CITRATE (PF) 100 MCG/2ML IJ SOLN
INTRAMUSCULAR | Status: AC
  Filled 2017-03-24: qty 2

## 2017-03-24 MED ORDER — GLYCOPYRROLATE 0.2 MG/ML IJ SOLN
INTRAMUSCULAR | Status: DC | PRN
  Administered 2017-03-24: 0.2 mg via INTRAVENOUS

## 2017-03-24 MED ORDER — OLOPATADINE HCL 0.1 % OP SOLN
1.0000 [drp] | Freq: Two times a day (BID) | OPHTHALMIC | Status: DC
  Administered 2017-03-24 – 2017-03-27 (×6): 1 [drp] via OPHTHALMIC
  Filled 2017-03-24: qty 5

## 2017-03-24 MED ORDER — VITAMIN E 180 MG (400 UNIT) PO CAPS
400.0000 [IU] | ORAL_CAPSULE | Freq: Every day | ORAL | Status: DC
  Administered 2017-03-25 – 2017-03-27 (×3): 400 [IU] via ORAL
  Filled 2017-03-24 (×4): qty 1

## 2017-03-24 MED ORDER — PSYLLIUM 95 % PO PACK
1.0000 | PACK | Freq: Two times a day (BID) | ORAL | Status: DC
  Administered 2017-03-24 – 2017-03-27 (×4): 1 via ORAL
  Filled 2017-03-24 (×7): qty 1

## 2017-03-24 MED ORDER — PHENOL 1.4 % MT LIQD: 1.0000 | OROMUCOSAL | Status: DC | PRN

## 2017-03-24 MED ORDER — MORPHINE SULFATE (PF) 2 MG/ML IV SOLN: 1.0000 mg | INTRAVENOUS | Status: DC | PRN

## 2017-03-24 MED ORDER — CEFAZOLIN SODIUM-DEXTROSE 2-4 GM/100ML-% IV SOLN
INTRAVENOUS | Status: AC
  Filled 2017-03-24: qty 100

## 2017-03-24 MED ORDER — OXYBUTYNIN CHLORIDE ER 10 MG PO TB24
10.0000 mg | ORAL_TABLET | Freq: Every day | ORAL | Status: DC
  Administered 2017-03-24 – 2017-03-26 (×3): 10 mg via ORAL
  Filled 2017-03-24 (×4): qty 1

## 2017-03-24 MED ORDER — FAMOTIDINE 20 MG PO TABS
20.0000 mg | ORAL_TABLET | Freq: Once | ORAL | Status: DC
  Administered 2017-03-24: 20 mg via ORAL

## 2017-03-24 MED ORDER — SODIUM CHLORIDE 0.9 % IV SOLN
INTRAVENOUS | Status: DC | PRN
  Administered 2017-03-24 (×2): via INTRAVENOUS

## 2017-03-24 MED ORDER — SODIUM CHLORIDE 0.9 % IV SOLN
INTRAVENOUS | Status: DC
  Administered 2017-03-25: 100 mL/h via INTRAVENOUS

## 2017-03-24 MED ORDER — SODIUM CHLORIDE 0.9 % IV SOLN
INTRAVENOUS | Status: DC | PRN
  Administered 2017-03-24: 5 ug/min via INTRAVENOUS

## 2017-03-24 MED ORDER — SODIUM CHLORIDE 0.9 % IV SOLN: INTRAVENOUS | Status: DC | PRN

## 2017-03-24 MED ORDER — SENNOSIDES-DOCUSATE SODIUM 8.6-50 MG PO TABS
1.0000 | ORAL_TABLET | Freq: Two times a day (BID) | ORAL | Status: DC
  Administered 2017-03-24 – 2017-03-27 (×6): 1 via ORAL
  Filled 2017-03-24 (×6): qty 1

## 2017-03-24 MED ORDER — DEXTROSE 5 % IV SOLN
2000.0000 mg | INTRAVENOUS | Status: DC
  Filled 2017-03-24: qty 20

## 2017-03-24 MED ORDER — BUPIVACAINE LIPOSOME 1.3 % IJ SUSP
INTRAMUSCULAR | Status: AC
  Filled 2017-03-24: qty 20

## 2017-03-24 MED ORDER — TRANEXAMIC ACID 1000 MG/10ML IV SOLN
1000.0000 mg | Freq: Once | INTRAVENOUS | Status: AC
  Administered 2017-03-24: 1000 mg via INTRAVENOUS
  Filled 2017-03-24: qty 10

## 2017-03-24 MED ORDER — BUPIVACAINE LIPOSOME 1.3 % IJ SUSP
INTRAMUSCULAR | Status: DC | PRN
  Administered 2017-03-24: 60 mL

## 2017-03-24 MED ORDER — TRANEXAMIC ACID 1000 MG/10ML IV SOLN
1000.0000 mg | INTRAVENOUS | Status: DC
  Filled 2017-03-24: qty 10

## 2017-03-24 MED ORDER — PROPOFOL 500 MG/50ML IV EMUL
INTRAVENOUS | Status: DC | PRN
  Administered 2017-03-24: 50 ug/kg/min via INTRAVENOUS

## 2017-03-24 MED ORDER — OXYCODONE HCL 5 MG PO TABS: 5.0000 mg | ORAL_TABLET | Freq: Once | ORAL | Status: DC | PRN

## 2017-03-24 MED ORDER — ACETAMINOPHEN 10 MG/ML IV SOLN
INTRAVENOUS | Status: AC
  Filled 2017-03-24: qty 100

## 2017-03-24 MED ORDER — LISINOPRIL-HYDROCHLOROTHIAZIDE 20-25 MG PO TABS
1.0000 | ORAL_TABLET | Freq: Every day | ORAL | Status: DC
Start: 2017-03-24 — End: 2017-03-24

## 2017-03-24 MED ORDER — NEOMYCIN-POLYMYXIN B GU 40-200000 IR SOLN
Status: DC | PRN
  Administered 2017-03-24: 16 mL

## 2017-03-24 MED ORDER — OCUVITE-LUTEIN PO CAPS
2.0000 | ORAL_CAPSULE | Freq: Every day | ORAL | Status: DC
  Administered 2017-03-25 – 2017-03-27 (×3): 2 via ORAL
  Filled 2017-03-24 (×3): qty 2

## 2017-03-24 MED ORDER — MAGNESIUM HYDROXIDE 400 MG/5ML PO SUSP
30.0000 mL | Freq: Every day | ORAL | Status: DC | PRN
  Administered 2017-03-26: 30 mL via ORAL
  Filled 2017-03-24: qty 30

## 2017-03-24 MED ORDER — OXYCODONE HCL 5 MG PO TABS
5.0000 mg | ORAL_TABLET | ORAL | Status: DC | PRN
  Administered 2017-03-24 – 2017-03-25 (×2): 5 mg via ORAL
  Filled 2017-03-24 (×2): qty 1

## 2017-03-24 MED ORDER — CALCIUM-MAGNESIUM-ZINC-D3 PO TABS: 1.0000 | ORAL_TABLET | Freq: Every day | ORAL | Status: DC

## 2017-03-24 MED ORDER — DONEPEZIL HCL 5 MG PO TABS
5.0000 mg | ORAL_TABLET | Freq: Every day | ORAL | Status: DC
  Administered 2017-03-24 – 2017-03-26 (×3): 5 mg via ORAL
  Filled 2017-03-24 (×4): qty 1

## 2017-03-24 MED ORDER — ACETAMINOPHEN 10 MG/ML IV SOLN
1000.0000 mg | Freq: Four times a day (QID) | INTRAVENOUS | Status: AC
  Administered 2017-03-24 – 2017-03-25 (×4): 1000 mg via INTRAVENOUS
  Filled 2017-03-24 (×4): qty 100

## 2017-03-24 MED ORDER — GLYCOPYRROLATE 0.2 MG/ML IJ SOLN
INTRAMUSCULAR | Status: AC
  Filled 2017-03-24: qty 1

## 2017-03-24 MED ORDER — ENOXAPARIN SODIUM 30 MG/0.3ML ~~LOC~~ SOLN
30.0000 mg | Freq: Two times a day (BID) | SUBCUTANEOUS | Status: DC
  Administered 2017-03-25 – 2017-03-27 (×5): 30 mg via SUBCUTANEOUS
  Filled 2017-03-24 (×5): qty 0.3

## 2017-03-24 MED ORDER — B COMPLEX-C PO TABS
1.0000 | ORAL_TABLET | Freq: Every day | ORAL | Status: DC
  Administered 2017-03-24 – 2017-03-27 (×4): 1 via ORAL
  Filled 2017-03-24 (×4): qty 1

## 2017-03-24 MED ORDER — CEFAZOLIN SODIUM-DEXTROSE 2-4 GM/100ML-% IV SOLN: 2.0000 g | INTRAVENOUS | Status: DC

## 2017-03-24 MED ORDER — CELECOXIB 200 MG PO CAPS
200.0000 mg | ORAL_CAPSULE | Freq: Two times a day (BID) | ORAL | Status: DC
  Administered 2017-03-24 – 2017-03-27 (×6): 200 mg via ORAL
  Filled 2017-03-24 (×6): qty 1

## 2017-03-24 MED ORDER — OXYCODONE HCL 5 MG/5ML PO SOLN: 5.0000 mg | Freq: Once | ORAL | Status: DC | PRN

## 2017-03-24 MED ORDER — FENTANYL CITRATE (PF) 100 MCG/2ML IJ SOLN
25.0000 ug | INTRAMUSCULAR | Status: AC | PRN
  Administered 2017-03-24 (×6): 25 ug via INTRAVENOUS

## 2017-03-24 MED ORDER — VANCOMYCIN HCL 1000 MG IV SOLR
INTRAVENOUS | Status: AC
  Filled 2017-03-24: qty 3000

## 2017-03-24 MED ORDER — OXYCODONE HCL 5 MG PO TABS
10.0000 mg | ORAL_TABLET | ORAL | Status: DC | PRN
Start: 2017-03-24 — End: 2017-03-27
  Administered 2017-03-25: 10 mg via ORAL
  Filled 2017-03-24: qty 2

## 2017-03-24 MED ORDER — MENTHOL 3 MG MT LOZG: 1.0000 | LOZENGE | OROMUCOSAL | Status: DC | PRN

## 2017-03-24 MED ORDER — PANTOPRAZOLE SODIUM 40 MG PO TBEC
40.0000 mg | DELAYED_RELEASE_TABLET | Freq: Two times a day (BID) | ORAL | Status: DC
  Administered 2017-03-24 – 2017-03-27 (×5): 40 mg via ORAL
  Filled 2017-03-24 (×6): qty 1

## 2017-03-24 MED ORDER — TRANEXAMIC ACID 1000 MG/10ML IV SOLN
INTRAVENOUS | Status: DC | PRN
  Administered 2017-03-24: 1000 mg via INTRAVENOUS

## 2017-03-24 SURGICAL SUPPLY — 72 items
BLADE OSCILLATING/SAGITTAL (BLADE) ×1
BLADE SAW 1 (BLADE) ×2 IMPLANT
BLADE SAW 1/2 (BLADE) ×2 IMPLANT
BLADE SW THK.38XMED LNG THN (BLADE) ×1 IMPLANT
BONE CEMENT GENTAMICIN (Cement) ×4 IMPLANT
CANISTER SUCT 1200ML W/VALVE (MISCELLANEOUS) ×2 IMPLANT
CANISTER SUCT 3000ML PPV (MISCELLANEOUS) ×2 IMPLANT
CATH TRAY METER 16FR LF (MISCELLANEOUS) ×2 IMPLANT
CEMENT BONE GENTAMICIN 40 (Cement) ×2 IMPLANT
CNTNR SPEC 2.5X3XGRAD LEK (MISCELLANEOUS) ×1
CONT SPEC 4OZ STER OR WHT (MISCELLANEOUS) ×1
CONTAINER SPEC 2.5X3XGRAD LEK (MISCELLANEOUS) ×1 IMPLANT
COOLER POLAR GLACIER W/PUMP (MISCELLANEOUS) ×2 IMPLANT
CUFF TOURN 24 STER (MISCELLANEOUS) IMPLANT
CUFF TOURN 30 STER DUAL PORT (MISCELLANEOUS) IMPLANT
DRAPE SHEET LG 3/4 BI-LAMINATE (DRAPES) ×4 IMPLANT
DRAPE TABLE BACK 80X90 (DRAPES) ×2 IMPLANT
DRSG DERMACEA 8X12 NADH (GAUZE/BANDAGES/DRESSINGS) ×2 IMPLANT
DRSG OPSITE POSTOP 4X14 (GAUZE/BANDAGES/DRESSINGS) ×2 IMPLANT
DURAPREP 26ML APPLICATOR (WOUND CARE) ×2 IMPLANT
ELECT CAUTERY BLADE 6.4 (BLADE) ×2 IMPLANT
ELECT REM PT RETURN 9FT ADLT (ELECTROSURGICAL) ×2
ELECTRODE REM PT RTRN 9FT ADLT (ELECTROSURGICAL) ×1 IMPLANT
EVACUATOR 1/8 PVC DRAIN (DRAIN) ×2 IMPLANT
GAUZE SPONGE 4X4 12PLY STRL (GAUZE/BANDAGES/DRESSINGS) ×2 IMPLANT
GLOVE BIOGEL M STRL SZ7.5 (GLOVE) ×2 IMPLANT
GLOVE BIOGEL PI IND STRL 9 (GLOVE) ×1 IMPLANT
GLOVE BIOGEL PI INDICATOR 9 (GLOVE) ×1
GLOVE INDICATOR 8.0 STRL GRN (GLOVE) ×2 IMPLANT
GLOVE SURG SYN 9.0  PF PI (GLOVE) ×1
GLOVE SURG SYN 9.0 PF PI (GLOVE) ×1 IMPLANT
GOWN STRL REUS W/ TWL LRG LVL3 (GOWN DISPOSABLE) ×2 IMPLANT
GOWN STRL REUS W/TWL 2XL LVL3 (GOWN DISPOSABLE) ×2 IMPLANT
GOWN STRL REUS W/TWL LRG LVL3 (GOWN DISPOSABLE) ×2
HANDPIECE VERSAJET DEBRIDEMENT (MISCELLANEOUS) ×2 IMPLANT
HOOD PEEL AWAY FLYTE STAYCOOL (MISCELLANEOUS) ×4 IMPLANT
INSERT PFC SIG STB SZ3 15.0MM (Knees) ×2 IMPLANT
IRRIGATION STRYKERFLOW (MISCELLANEOUS) ×1 IMPLANT
IRRIGATOR STRYKERFLOW (MISCELLANEOUS) ×2
KIT RM TURNOVER STRD PROC AR (KITS) ×2 IMPLANT
KNIFE SCULPS 14X20 (INSTRUMENTS) ×2 IMPLANT
NDL SAFETY 18GX1.5 (NEEDLE) ×2 IMPLANT
NEEDLE SPNL 18GX3.5 QUINCKE PK (NEEDLE) ×2 IMPLANT
NEEDLE SPNL 20GX3.5 QUINCKE YW (NEEDLE) ×4 IMPLANT
NS IRRIG 1000ML POUR BTL (IV SOLUTION) ×2 IMPLANT
PACK TOTAL KNEE (MISCELLANEOUS) ×2 IMPLANT
PAD ABD DERMACEA PRESS 5X9 (GAUZE/BANDAGES/DRESSINGS) ×2 IMPLANT
PAD WRAPON POLAR KNEE (MISCELLANEOUS) ×1 IMPLANT
PULSAVAC PLUS IRRIG FAN TIP (DISPOSABLE) ×2
SOL .9 NS 3000ML IRR  AL (IV SOLUTION) ×1
SOL .9 NS 3000ML IRR UROMATIC (IV SOLUTION) ×1 IMPLANT
SPONGE LAP 18X18 5 PK (GAUZE/BANDAGES/DRESSINGS) ×2 IMPLANT
STAPLER SKIN PROX 35W (STAPLE) ×2 IMPLANT
STEM UNIVERSAL REVISION 75X16 (Stem) ×2 IMPLANT
SUCTION FRAZIER HANDLE 10FR (MISCELLANEOUS) ×1
SUCTION TUBE FRAZIER 10FR DISP (MISCELLANEOUS) ×1 IMPLANT
SUT MNCRL 0 1X36 CT-1 (SUTURE) ×1 IMPLANT
SUT MON AB 2-0 CT1 36 (SUTURE) ×2 IMPLANT
SUT MONOCRYL 0 (SUTURE) ×1
SUT PROLENE 1 CT 1 30 (SUTURE) ×2 IMPLANT
SUT VIC AB 0 CT1 36 (SUTURE) ×2 IMPLANT
SUT VIC AB 1 CT1 36 (SUTURE) ×4 IMPLANT
SUT VIC AB 2-0 CT1 (SUTURE) ×2 IMPLANT
SWAB CULTURE AMIES ANAERIB BLU (MISCELLANEOUS) ×14 IMPLANT
SYR 20CC LL (SYRINGE) ×2 IMPLANT
SYR 30ML LL (SYRINGE) ×4 IMPLANT
TIP COAXIAL FEMORAL CANAL (MISCELLANEOUS) ×2 IMPLANT
TIP FAN IRRIG PULSAVAC PLUS (DISPOSABLE) ×1 IMPLANT
TOWEL OR 17X26 4PK STRL BLUE (TOWEL DISPOSABLE) ×2 IMPLANT
TOWER CARTRIDGE SMART MIX (DISPOSABLE) ×2 IMPLANT
TRAY REVISION SZ 3 15MM (Joint) ×2 IMPLANT
WRAPON POLAR PAD KNEE (MISCELLANEOUS) ×2

## 2017-03-24 NOTE — NC FL2 (Signed)
[place] MEDICAID FL2 LEVEL OF CARE SCREENING TOOL     IDENTIFICATION  Patient Name: Sherry Brooks Birthdate: 1929-01-21 Sex: female Admission Date (Current Location): 03/24/2017  [place] and [place] Number:  Chiropodist and Address:  [facility], [address], [place], [place] [address]      Provider Number: [id number]  Attending Physician Name and Address:  Donato Heinz, MD  Relative Name and Phone Number:       Current Level of Care: Hospital Recommended Level of Care: Skilled Nursing Facility Prior Approval Number:    Date Approved/Denied:   PASRR Number:  [id number] )  Discharge Plan: SNF    Current Diagnoses: Patient Active Problem List   Diagnosis Date Noted  . S/P total knee arthroplasty 03/24/2017  . UTI (urinary tract infection) 03/09/2017  . H/O diverticulitis of colon 07/29/2016  . Broken arm 07/29/2016  . Chronic venous insufficiency 04/22/2016  . Pain in limb 04/22/2016  . Abnormal finding on mammography 03/28/2015  . Adult BMI 30+ 03/28/2015  . Chronic kidney disease (CKD), stage III (moderate) (HCC) 03/28/2015  . Chronic LBP 03/28/2015  . Can't get food down 03/28/2015  . Lymphedema 03/28/2015  . Degeneration macular 03/28/2015  . Amnesia 03/28/2015  . Subclinical hypothyroidism 03/28/2015  . Hypercholesterolemia 03/28/2015  . Pain in the chest 02/14/2015  . SOB (shortness of breath) 02/14/2015  . Morbid obesity (HCC) 02/14/2015  . Unsteady gait 02/14/2015  . Primary osteoarthritis of both knees 02/14/2015  . Essential hypertension 02/14/2015  . Anxiety and depression 02/14/2015  . Joint pain 01/04/2014  . Bursitis, hip 10/19/2013  . Spondylolisthesis 10/28/2012  . Arthritis, degenerative 08/25/2009  . Type 2 diabetes mellitus (HCC) 07/03/2007  . Colon, diverticulosis 02/12/2001  . OP (osteoporosis) 02/12/2001    Orientation RESPIRATION BLADDER Height & Weight     Self, Time,  Situation, Place  O2 (2 Liters Oxygen ) Continent Weight: 208 lb (94.3 kg) Height:     BEHAVIORAL SYMPTOMS/MOOD NEUROLOGICAL BOWEL NUTRITION STATUS      Continent Diet (Regular Diet )  AMBULATORY STATUS COMMUNICATION OF NEEDS Skin   Extensive Assist Verbally Surgical wounds (Incision: Left Knee )                       Personal Care Assistance Level of Assistance  Bathing, Feeding, Dressing Bathing Assistance: Limited assistance Feeding assistance: Independent Dressing Assistance: Limited assistance     Functional Limitations Info  Sight, Hearing, Speech Sight Info: Adequate Hearing Info: Adequate Speech Info: Adequate    SPECIAL CARE FACTORS FREQUENCY  PT (By licensed PT), OT (By licensed OT)     PT Frequency:  (5) OT Frequency:  (5)            Contractures      Additional Factors Info  Code Status, Allergies Code Status Info:  (Full Code. ) Allergies Info:  (No Known Allergies. )           Current Medications (03/24/2017):  This is the current hospital active medication list Current Facility-Administered Medications  Medication Dose Route Frequency Provider Last Rate Last Dose  . 0.9 %  sodium chloride infusion   Intravenous Continuous Hooten, Illene Labrador, MD      . acetaminophen (OFIRMEV) IV 1,000 mg  1,000 mg Intravenous Q6H Hooten, Illene Labrador, MD      . acetaminophen (TYLENOL) tablet 650 mg  650 mg Oral Q4H PRN Hooten, Illene Labrador, MD  Or  . acetaminophen (TYLENOL) suppository 650 mg  650 mg Rectal Q4H PRN Hooten, Illene Labrador, MD      . alum & mag hydroxide-simeth (MAALOX/MYLANTA) 200-200-20 MG/5ML suspension 30 mL  30 mL Oral Q4H PRN Hooten, Illene Labrador, MD      . bisacodyl (DULCOLAX) suppository 10 mg  10 mg Rectal Daily PRN Hooten, Illene Labrador, MD      . Calcium-Magnesium-Zinc-D3 TABS 1 tablet  1 tablet Oral Daily Hooten, Illene Labrador, MD      . ceFAZolin (ANCEF) IVPB 2g/100 mL premix  2 g Intravenous Q6H Hooten, Illene Labrador, MD      . celecoxib (CELEBREX) capsule 200 mg  200  mg Oral Q12H Hooten, Illene Labrador, MD      . diphenhydrAMINE (BENADRYL) 12.5 MG/5ML elixir 12.5-25 mg  12.5-25 mg Oral Q4H PRN Hooten, Illene Labrador, MD      . donepezil (ARICEPT) tablet 5 mg  5 mg Oral QHS Hooten, Illene Labrador, MD      . Melene Muller ON 03/25/2017] enoxaparin (LOVENOX) injection 30 mg  30 mg Subcutaneous Q12H Hooten, Illene Labrador, MD      . famotidine (PEPCID) 20 MG tablet           . ferrous sulfate tablet 325 mg  325 mg Oral BID WC Hooten, Illene Labrador, MD      . lisinopril-hydrochlorothiazide (PRINZIDE,ZESTORETIC) 20-25 MG per tablet 1 tablet  1 tablet Oral Daily Hooten, Illene Labrador, MD      . magnesium hydroxide (MILK OF MAGNESIA) suspension 30 mL  30 mL Oral Daily PRN Hooten, Illene Labrador, MD      . menthol-cetylpyridinium (CEPACOL) lozenge 3 mg  1 lozenge Oral PRN Hooten, Illene Labrador, MD       Or  . phenol (CHLORASEPTIC) mouth spray 1 spray  1 spray Mouth/Throat PRN Hooten, Illene Labrador, MD      . metoCLOPramide (REGLAN) tablet 10 mg  10 mg Oral TID AC & HS Hooten, Illene Labrador, MD      . morphine 2 MG/ML injection 1 mg  1 mg Intravenous Q2H PRN Hooten, Illene Labrador, MD      . Melene Muller ON 03/25/2017] multivitamin-lutein (OCUVITE-LUTEIN) capsule 2 capsule  2 capsule Oral Daily Hooten, Illene Labrador, MD      . olopatadine (PATANOL) 0.1 % ophthalmic solution 1 drop  1 drop Both Eyes BID Hooten, Illene Labrador, MD      . ondansetron (ZOFRAN) tablet 4 mg  4 mg Oral Q6H PRN Hooten, Illene Labrador, MD       Or  . ondansetron (ZOFRAN) injection 4 mg  4 mg Intravenous Q6H PRN Hooten, Illene Labrador, MD      . oxybutynin (DITROPAN-XL) 24 hr tablet 10 mg  10 mg Oral QHS Hooten, Illene Labrador, MD      . oxyCODONE (Oxy IR/ROXICODONE) immediate release tablet 10 mg  10 mg Oral Q3H PRN Hooten, Illene Labrador, MD      . oxyCODONE (Oxy IR/ROXICODONE) immediate release tablet 5 mg  5 mg Oral Q3H PRN Hooten, Illene Labrador, MD      . pantoprazole (PROTONIX) EC tablet 40 mg  40 mg Oral BID Hooten, Illene Labrador, MD      . pravastatin (PRAVACHOL) tablet 10 mg  10 mg Oral QHS Hooten, Illene Labrador, MD      .  psyllium (HYDROCIL/METAMUCIL) packet 1 packet  1 packet Oral BID Hooten, Illene Labrador, MD      . senna-docusate (Senokot-S) tablet 1 tablet  1 tablet Oral BID Hooten, Illene LabradorJames P, MD      . sertraline (ZOLOFT) tablet 50 mg  50 mg Oral Daily Hooten, Illene LabradorJames P, MD      . sodium phosphate (FLEET) 7-19 GM/118ML enema 1 enema  1 enema Rectal Once PRN Hooten, Illene LabradorJames P, MD      . traMADol Janean Sark(ULTRAM) tablet 50-100 mg  50-100 mg Oral Q4H PRN Hooten, Illene LabradorJames P, MD      . Vitamin B Complex TABS 1 tablet  1 tablet Oral Daily Hooten, Illene LabradorJames P, MD      . vitamin E capsule 400 Units  400 Units Oral Daily Hooten, Illene LabradorJames P, MD         Discharge Medications: Please see discharge summary for a list of discharge medications.  Relevant Imaging Results:  Relevant Lab Results:   Additional Information  (SSN: [id number])  Karmelo Bass, Darleen CrockerBailey M, LCSW

## 2017-03-24 NOTE — Op Note (Signed)
OPERATIVE NOTE  DATE OF SURGERY:  03/24/2017  PATIENT NAME:  Sherry Brooks   DOB: [birth year]  MRN: [id number]  PRE-OPERATIVE DIAGNOSIS: Loose left tibial component status post left total knee arthroplasty  POST-OPERATIVE DIAGNOSIS:  Same  PROCEDURE:  Left knee revision arthroplasty  (tibial component)  SURGEON:  Jena GaussJames P Jayde Mcallister, Jr. M.D.  ASSISTANT:  Van ClinesJon Wolfe, PA (present and scrubbed throughout the case, critical for assistance with exposure, retraction, instrumentation, and closure)  ANESTHESIA: spinal  ESTIMATED BLOOD LOSS: 75 mL  FLUIDS REPLACED: 1200 mL of crystalloid  TOURNIQUET TIME: 141 minutes  DRAINS: 2 medium Hemovac drains  IMPLANTS UTILIZED: DePuy Sigma size 3 15 mm thick MBT revision tibial component (cemented), 16 mm x 75 mm universal fluted stem, and a 15 mm stabilized rotating platform polyethylene insert.  INDICATIONS FOR SURGERY: Sherry Brooks is a 83 y.o. year old female who had a remote history of left total knee arthroplasty performed by another physician. She was seen for complaints of progressive left knee pain. X-rays demonstrated findings consistent with loosening and subsidence of the tibial tray. After discussion of the risks and benefits of surgical intervention, the patient expressed understanding of the risks benefits and agree with plans for revision of the knee arthroplasty..   The risks, benefits, and alternatives were discussed at length including but not limited to the risks of infection, bleeding, nerve injury, stiffness, blood clots, the need for revision surgery, cardiopulmonary complications, among others, and they were willing to proceed.  PROCEDURE IN DETAIL: The patient was brought into the operating room and, after adequate spinal anesthesia was achieved, a tourniquet was placed on the patient's upper thigh. The patient's knee and leg were cleaned and prepped with alcohol and DuraPrep and draped in the usual sterile fashion. A  "timeout" was performed as per usual protocol. The lower extremity was exsanguinated using an Esmarch, and the tourniquet was inflated to 300 mmHg. An anterior longitudinal incision was made followed by a standard medial parapatellar approach. Before performing any arthrotomy, 30 cc of fluid was aspirated from the knee and submitted for stat Gram stain, complete cell count, and cultures. The deep fibers and medial collateral ligament were elevated in subperiosteal fashion off the medial flare of the tibia so as to maintain a continuous soft tissue sleeve. Thickened, fibrotic tissue was excised from both the medial and lateral gutters using electrocautery. Tissue was submitted for pathology and for cultures. The patella was then subluxed laterally. The femoral implant was visualized and the interface was felt to be stable. Inspection of polyethylene demonstrates gross damage to the central post. The tibial component was grossly loose. The polyethylene was removed. Debridement of the soft tissue from around the tibial implant was performed using combination of curettes and rongeurs. The tibial component was easily removed. Swabs were obtained under the tibial tray as well as from the medullary canal of the tibia for stat Gram stain and cultures. There was no cement still affixed to the undersurface of the tibial component. The extra measure to a cutting guide was positioned and an initial cut of the proximal tibia was performed. Next, residual cement was removed from the post-hole and the remainder of the proximal tibia using curettes and pituitary rongeurs. The medullary canal was reamed in a sequential fashion up to a 16 mm diameter. The alignment and position of the initial cut was assessed and felt to be adequate. The proximal tibia was sized and was felt that a size 1 tray provided coverage.  Given the amount of subsidence, it was elected to use a 15 mm thick size 3 implant. The central post-hole was reamed for  the MBT revision tray. Trial reduction was performed with the size 3, 50 mm thick MBT revision tray with a 16 mm x 75 mm stem using a 50 mm polyethylene. Excellent mediolateral soft tissue balancing was appreciated with good range of motion and patellar tracking.  The trials were removed. Preliminary gram stains from the specimens demonstrated no organisms. The cut surfaces of bone were irrigated with copious amounts of saline with MRI solution using pulsatile lavage and suctioned dry. Polymethylmethacrylate cement with gentamicin was prepared in the usual fashion using a vacuum mixer. Cement was applied to the cut surface of the proximal tibia as well as along the undersurface of a size 3, 15 mm MBT revision tibial component with a 16 mm x 75 mm fluted stem. Tibial component was positioned and impacted into place. Excess cement was removed using Personal assistant.  A 15 mm polyethylene trial was inserted and the knee was brought into full extension with steady axial compression applied. After adequate curing of the cement, the tourniquet was deflated after a total tourniquet time of 141 minutes. Hemostasis was achieved using electrocautery. The knee was irrigated with copious amounts of normal saline with antibiotic solution using pulsatile lavage and then suctioned dry. 20 mL of 1.3% Exparel and 60 mL of 0.25% Marcaine in 40 mL of normal saline was injected along the posterior capsule, medial and lateral gutters, and along the arthrotomy site. A 15 mm stabilized rotating platform polyethylene insert was inserted and the knee was placed through a range of motion with excellent mediolateral soft tissue balancing appreciated and excellent patellar tracking noted. 2 medium drains were placed in the wound bed and brought out through separate stab incisions. The medial parapatellar portion of the incision was reapproximated using interrupted sutures of #1 Vicryl. Subcutaneous tissue was approximated in layers using  first #0 Vicryl followed #2-0 Vicryl. The skin was approximated with skin staples. A sterile dressing was applied.  The patient tolerated the procedure well and was transported to the recovery room in stable condition.    Jemaine Prokop P. Angie Fava., M.D.

## 2017-03-24 NOTE — OR Nursing (Signed)
Explanted poly and tibial tray

## 2017-03-24 NOTE — Anesthesia Post-op Follow-up Note (Signed)
Anesthesia QCDR form completed.        

## 2017-03-24 NOTE — Anesthesia Preprocedure Evaluation (Addendum)
Anesthesia Evaluation  Patient identified by MRN, date of birth, ID band Patient awake    Reviewed: Allergy & Precautions, H&P , NPO status , Patient's Chart, lab work & pertinent test results  History of Anesthesia Complications (+) PROLONGED EMERGENCE and history of anesthetic complications  Airway Mallampati: III  TM Distance: <3 FB Neck ROM: limited    Dental  (+) Poor Dentition, Chipped   Pulmonary neg pulmonary ROS, neg shortness of breath,           Cardiovascular Exercise Tolerance: Good hypertension, (-) angina+ Peripheral Vascular Disease  (-) Past MI and (-) DOE + Valvular Problems/Murmurs      Neuro/Psych PSYCHIATRIC DISORDERS Anxiety Depression negative neurological ROS     GI/Hepatic Neg liver ROS, GERD  Medicated and Controlled,  Endo/Other  diabetes, Type 2Hypothyroidism   Renal/GU Renal disease     Musculoskeletal  (+) Arthritis ,   Abdominal   Peds  Hematology negative hematology ROS (+)   Anesthesia Other Findings Past Medical History: No date: Anxiety No date: Arthritis     Comment:  knees No date: Borderline diabetic No date: Complication of anesthesia     Comment:  wakes up slowly, takes longer than usual to wake No date: Depression No date: Diabetes (HCC) No date: GERD (gastroesophageal reflux disease)     Comment:  on occas. uses TUMS for indigestion  No date: Heart murmur No date: History of kidney stones No date: History of stress test     Comment:  10 yrs. ago, states it was normal , had been ordered for              pt. due to stress related to husband's illness,, by Dr.               Vic Ripper No date: Hyperlipemia No date: Hypertension No date: Macular degeneration No date: Peripheral vascular disease (HCC)     Comment:  blood clots in legs mid 2000's  No date: Renal calculus     Comment:  history of > 50 yrs. ago  Past Surgical History: No date: APPENDECTOMY      Comment:  as a teenager  06/2008: BIOPSY THYROID No date: COLON SURGERY     Comment:  due to diverticulitis- had colostomy, then takedown  No date: EYE SURGERY     Comment:  cataracts removed - /w IOL No date: FRACTURE SURGERY     Comment:  L wrist, plate in place No date: JOINT REPLACEMENT     Comment:  both knees  10/23/2012: SPINE SURGERY     Comment:  Spinal Fusion, lower back  BMI    Body Mass Index:  38.04 kg/m      Reproductive/Obstetrics negative OB ROS                            Anesthesia Physical Anesthesia Plan  ASA: III  Anesthesia Plan: Spinal   Post-op Pain Management:    Induction: Intravenous  PONV Risk Score and Plan:   Airway Management Planned: Natural Airway and Nasal Cannula  Additional Equipment:   Intra-op Plan:   Post-operative Plan:   Informed Consent: I have reviewed the patients History and Physical, chart, labs and discussed the procedure including the risks, benefits and alternatives for the proposed anesthesia with the patient or authorized representative who has indicated his/her understanding and acceptance.   Dental Advisory Given  Plan Discussed with: Anesthesiologist, CRNA and Surgeon  Anesthesia Plan Comments: (Patient reports no bleeding problems and no anticoagulant use. Patient reports back problem with prior back surgery.  Plan to attempt spinal.  Plan for spinal with backup GA  Patient consented for risks of anesthesia including but not limited to:  - adverse reactions to medications - risk of bleeding, infection, nerve damage and headache - risk of failed spinal - damage to teeth, lips or other oral mucosa - sore throat or hoarseness - Damage to heart, brain, lungs or loss of life  Patient voiced understanding.)        Anesthesia Quick Evaluation

## 2017-03-24 NOTE — Discharge Instructions (Signed)
°  Instructions after Total Knee Replacement ° ° Noble Bodie P. Tangala Wiegert, Jr., M.D.    ° Dept. of Orthopaedics & Sports Medicine ° [facility] ° [address] ° [place], [place]  [address] ° Phone: [phone]   Fax: [phone] ° °  °DIET: °• Drink plenty of non-alcoholic fluids. °• Resume your normal diet. Include foods high in fiber. ° °ACTIVITY:  °• You may use crutches or a walker with weight-bearing as tolerated, unless instructed otherwise. °• You may be weaned off of the walker or crutches by your Physical Therapist.  °• Do NOT place pillows under the knee. Anything placed under the knee could limit your ability to straighten the knee.   °• Continue doing gentle exercises. Exercising will reduce the pain and swelling, increase motion, and prevent muscle weakness.   °• Please continue to use the TED compression stockings for 6 weeks. You may remove the stockings at night, but should reapply them in the morning. °• Do not drive or operate any equipment until instructed. ° °WOUND CARE:  °• Continue to use the PolarCare or ice packs periodically to reduce pain and swelling. °• You may bathe or shower after the staples are removed at the first office visit following surgery. ° °MEDICATIONS: °• You may resume your regular medications. °• Please take the pain medication as prescribed on the medication. °• Do not take pain medication on an empty stomach. °• You have been given a prescription for a blood thinner (Lovenox or Coumadin). Please take the medication as instructed. (NOTE: After completing a 2 week course of Lovenox, take one Enteric-coated aspirin once a day. This along with elevation will help reduce the possibility of phlebitis in your operated leg.) °• Do not drive or drink alcoholic beverages when taking pain medications. ° °CALL THE OFFICE FOR: °• Temperature above 101 degrees °• Excessive bleeding or drainage on the dressing. °• Excessive swelling, coldness, or paleness of the toes. °• Persistent  nausea and vomiting. ° °FOLLOW-UP:  °• You should have an appointment to return to the office in 10-14 days after surgery. °• Arrangements have been made for continuation of Physical Therapy (either home therapy or outpatient therapy). °  °

## 2017-03-24 NOTE — Progress Notes (Signed)
Patient's BP was 205/64 on admit. Caught patient upon BP meds from home. Re-check was 172/70. Notified Dr. Ernest PineHooten. Gave new orders for PRN dosing for Hydralazine IV.

## 2017-03-24 NOTE — H&P (Signed)
The patient has been re-examined, and the chart reviewed, and there have been no interval changes to the documented history and physical.    The risks, benefits, and alternatives have been discussed at length. The patient expressed understanding of the risks benefits and agreed with plans for surgical intervention.  Regan Mcbryar P. Madysyn Hanken, Jr. M.D.    

## 2017-03-24 NOTE — Anesthesia Procedure Notes (Signed)
Spinal  Patient location during procedure: OR Start time: 03/24/2017 10:56 AM End time: 03/24/2017 11:01 AM Staffing Performed: resident/CRNA  Preanesthetic Checklist Completed: patient identified, site marked, surgical consent, pre-op evaluation, timeout performed, IV checked, risks and benefits discussed and monitors and equipment checked Spinal Block Patient position: sitting Prep: ChloraPrep Patient monitoring: heart rate, continuous pulse ox, blood pressure and cardiac monitor Approach: left paramedian Location: L4-5 Injection technique: single-shot Needle Needle type: Introducer and Pencan  Needle gauge: 24 G Needle length: 9 cm Additional Notes Negative paresthesia. Negative blood return. Positive free-flowing CSF. Expiration date of kit checked and confirmed. Patient tolerated procedure well, without complications.

## 2017-03-24 NOTE — Transfer of Care (Signed)
Immediate Anesthesia Transfer of Care Note  Patient: Sherry Brooks  Procedure(s) Performed: TOTAL KNEE REVISION (Left Knee)  Patient Location: PACU  Anesthesia Type:Spinal  Level of Consciousness: sedated  Airway & Oxygen Therapy: Patient Spontanous Breathing and Patient connected to nasal cannula oxygen  Post-op Assessment: Report given to RN and Post -op Vital signs reviewed and stable  Post vital signs: Reviewed and stable  Last Vitals:  Vitals:   03/24/17 0936  BP: (!) 157/66  Pulse: (!) 56  Resp: 16  Temp: (!) 36.4 C  SpO2: 100%    Last Pain:  Vitals:   03/24/17 0936  TempSrc: Oral  PainSc: 5          Complications: No apparent anesthesia complications

## 2017-03-25 ENCOUNTER — Encounter: Payer: Self-pay | Admitting: Orthopedic Surgery

## 2017-03-25 LAB — BASIC METABOLIC PANEL
Anion gap: 8 (ref 5–15)
BUN: 21 mg/dL — AB (ref 6–20)
CHLORIDE: 100 mmol/L — AB (ref 101–111)
CO2: 25 mmol/L (ref 22–32)
CREATININE: 1.24 mg/dL — AB (ref 0.44–1.00)
Calcium: 9.1 mg/dL (ref 8.9–10.3)
GFR calc Af Amer: 44 mL/min — ABNORMAL LOW (ref >60)
GFR calc non Af Amer: 38 mL/min — ABNORMAL LOW (ref >60)
Glucose, Bld: 150 mg/dL — ABNORMAL HIGH (ref 65–99)
POTASSIUM: 3.6 mmol/L (ref 3.5–5.1)
Sodium: 133 mmol/L — ABNORMAL LOW (ref 135–145)

## 2017-03-25 LAB — CBC
HCT: 35.4 % (ref 35.0–47.0)
Hemoglobin: 11.8 g/dL — ABNORMAL LOW (ref 12.0–16.0)
MCH: 29.1 pg (ref 26.0–34.0)
MCHC: 33.5 g/dL (ref 32.0–36.0)
MCV: 86.9 fL (ref 80.0–100.0)
Platelets: 317 10*3/uL (ref 150–440)
RBC: 4.07 MIL/uL (ref 3.80–5.20)
RDW: 14 % (ref 11.5–14.5)
WBC: 9.9 10*3/uL (ref 3.6–11.0)

## 2017-03-25 MED ORDER — OXYCODONE HCL 5 MG PO TABS: 5.0000 mg | ORAL_TABLET | ORAL | 0 refills | Status: DC | PRN

## 2017-03-25 MED ORDER — TRAMADOL HCL 50 MG PO TABS: 50.0000 mg | ORAL_TABLET | ORAL | 0 refills | Status: DC | PRN

## 2017-03-25 MED ORDER — ACETAMINOPHEN 325 MG PO TABS: 650.0000 mg | ORAL_TABLET | ORAL | Status: DC | PRN

## 2017-03-25 MED ORDER — ACETAMINOPHEN 650 MG RE SUPP: 650.0000 mg | RECTAL | Status: DC | PRN

## 2017-03-25 MED ORDER — ENOXAPARIN SODIUM 40 MG/0.4ML ~~LOC~~ SOLN: 40.0000 mg | SUBCUTANEOUS | 0 refills | Status: DC

## 2017-03-25 MED ORDER — TRAMADOL HCL 50 MG PO TABS: 50.0000 mg | ORAL_TABLET | ORAL | Status: DC | PRN

## 2017-03-25 NOTE — Discharge Summary (Signed)
Physician Discharge Summary  Patient ID: GER NICKS MRN: [id number] DOB/AGE: 1928/06/09 83 y.o.  Admit date: 03/24/2017 Discharge date: 03/27/2017  Admission Diagnoses:  loose orthopedic implant,status post total left knee replacement   Discharge Diagnoses: Patient Active Problem List   Diagnosis Date Noted  . S/P total knee arthroplasty 03/24/2017  . UTI (urinary tract infection) 03/09/2017  . H/O diverticulitis of colon 07/29/2016  . Broken arm 07/29/2016  . Chronic venous insufficiency 04/22/2016  . Pain in limb 04/22/2016  . Abnormal finding on mammography 03/28/2015  . Adult BMI 30+ 03/28/2015  . Chronic kidney disease (CKD), stage III (moderate) (HCC) 03/28/2015  . Chronic LBP 03/28/2015  . Can't get food down 03/28/2015  . Lymphedema 03/28/2015  . Degeneration macular 03/28/2015  . Amnesia 03/28/2015  . Subclinical hypothyroidism 03/28/2015  . Hypercholesterolemia 03/28/2015  . Pain in the chest 02/14/2015  . SOB (shortness of breath) 02/14/2015  . Morbid obesity (HCC) 02/14/2015  . Unsteady gait 02/14/2015  . Primary osteoarthritis of both knees 02/14/2015  . Essential hypertension 02/14/2015  . Anxiety and depression 02/14/2015  . Joint pain 01/04/2014  . Bursitis, hip 10/19/2013  . Spondylolisthesis 10/28/2012  . Arthritis, degenerative 08/25/2009  . Type 2 diabetes mellitus (HCC) 07/03/2007  . Colon, diverticulosis 02/12/2001  . OP (osteoporosis) 02/12/2001    Past Medical History:  Diagnosis Date  . Anxiety   . Arthritis    knees  . Borderline diabetic   . Complication of anesthesia    wakes up slowly, takes longer than usual to wake  . Depression   . Diabetes (HCC)   . GERD (gastroesophageal reflux disease)    on occas. uses TUMS for indigestion   . Heart murmur   . History of kidney stones   . History of stress test    10 yrs. ago, states it was normal , had been ordered for pt. due to stress related to husband's illness,, by Dr.  Vic Ripper  . Hyperlipemia   . Hypertension   . Macular degeneration   . Peripheral vascular disease (HCC)    blood clots in legs mid 2000's   . Renal calculus    history of > 50 yrs. ago     Transfusion: None   Consultants (if any):   Discharged Condition: Improved  Hospital Course: LATRICIA CERRITO is an 83 y.o. female who was admitted 03/24/2017 with a diagnosis of degenerative arthrosis left knee and went to the operating room on 03/24/2017 and underwent the above named procedures.    Surgeries:Procedure(s): TOTAL KNEE REVISION on 03/24/2017   PRE-OPERATIVE DIAGNOSIS: Loose left tibial component status post left total knee arthroplasty  POST-OPERATIVE DIAGNOSIS:  Same  PROCEDURE:  Left knee revision arthroplasty  (tibial component)  SURGEON:  Jena Gauss. M.D.  ASSISTANT:  Van Clines, PA (present and scrubbed throughout the case, critical for assistance with exposure, retraction, instrumentation, and closure)  ANESTHESIA: spinal  ESTIMATED BLOOD LOSS: 75 mL  FLUIDS REPLACED: 1200 mL of crystalloid  TOURNIQUET TIME: 141 minutes  DRAINS: 2 medium Hemovac drains  IMPLANTS UTILIZED: DePuy Sigma size 3 15 mm thick MBT revision tibial component (cemented), 16 mm x 75 mm universal fluted stem, and a 15 mm stabilized rotating platform polyethylene insert.  INDICATIONS FOR SURGERY: PENI RUPARD is a 83 y.o. year old female who had a remote history of left total knee arthroplasty performed by another physician. She was seen for complaints of progressive left knee pain. X-rays demonstrated findings consistent  with loosening and subsidence of the tibial tray. After discussion of the risks and benefits of surgical intervention, the patient expressed understanding of the risks benefits and agree with plans for revision of the knee arthroplasty..   The risks, benefits, and alternatives were discussed at length including but not limited to the risks  of infection, bleeding, nerve injury, stiffness, blood clots, the need for revision surgery, cardiopulmonary complications, among others, and they were willing to proceed. Patient tolerated the surgery well. No complications .Patient was taken to PACU where she was stabilized and then transferred to the orthopedic floor.  Patient started on Lovenox 30 mg q 12 hrs. Foot pumps applied bilaterally at 80 mm hgb. Heels elevated off bed with rolled towels. No evidence of DVT. Calves non tender. Negative Homan. Physical therapy started on day #1 for gait training and transfer with OT starting on  day #1 for ADL and assisted devices. Patient has done well with therapy. Ambulated 10 feet upon being discharged.  Patient's IV And Foley were discontinued on day #1 with Hemovac being discontinued on day #2. Dressing was changed on day 2 prior to patient being discharged   She was given perioperative antibiotics:  Anti-infectives    Start     Dose/Rate Route Frequency Ordered Stop   03/24/17 2000  ceFAZolin (ANCEF) 2 g in dextrose 5 % 100 mL IVPB     2 g 240 mL/hr over 30 Minutes Intravenous Every 6 hours 03/24/17 1631 03/25/17 1959   03/24/17 0950  ceFAZolin (ANCEF) 2-4 GM/100ML-% IVPB    Comments:  Manfred Arch   : cabinet override      03/24/17 0950 03/24/17 1403   03/24/17 0600  ceFAZolin (ANCEF) 2,000 mg in dextrose 5 % 100 mL IVPB  Status:  Discontinued     2,000 mg 240 mL/hr over 30 Minutes Intravenous On call to O.R. 03/24/17 0020 03/24/17 0326   03/24/17 0600  ceFAZolin (ANCEF) IVPB 2g/100 mL premix     2 g 200 mL/hr over 30 Minutes Intravenous On call to O.R. 03/24/17 0326 03/24/17 1418   03/24/17 0018  ceFAZolin (ANCEF) IVPB 2g/100 mL premix  Status:  Discontinued     2 g 200 mL/hr over 30 Minutes Intravenous On call to O.R. 03/24/17 0018 03/24/17 0019    .  She was fitted with AV 1 compression foot pump devices, instructed on heel pumps, early ambulation, and fitted with TED stockings  bilaterally for DVT prophylaxis.  She benefited maximally from the hospital stay and there were no complications.    Recent vital signs:  Vitals:   03/25/17 0504 03/25/17 0732  BP: (!) 121/50 (!) 150/70  Pulse: (!) 51 (!) 50  Resp: 19 16  Temp: 97.7 F (36.5 C) 97.9 F (36.6 C)  SpO2: 95% 100%    Recent laboratory studies:  Lab Results  Component Value Date   HGB 13.0 03/14/2017   HGB 11.6 (L) 03/10/2017   HGB 12.1 03/09/2017   Lab Results  Component Value Date   WBC 7.9 03/14/2017   PLT 346 03/14/2017   Lab Results  Component Value Date   INR 0.97 03/14/2017   Lab Results  Component Value Date   NA 135 03/14/2017   K 4.2 03/14/2017   CL 99 (L) 03/14/2017   CO2 26 03/14/2017   BUN 33 (H) 03/14/2017   CREATININE 0.77 03/14/2017   GLUCOSE 109 (H) 03/14/2017    Discharge Medications:   Allergies as of 03/25/2017   No  Known Allergies     Medication List    TAKE these medications   acetaminophen 650 MG CR tablet Commonly known as:  TYLENOL Take 650 mg by mouth every 6 (six) hours.   ARTIFICIAL TEAR SOLUTION OP Apply 1 drop to eye daily.   CALCIUM-MAGNESIUM-ZINC-D3 PO Take 1 tablet by mouth daily.   donepezil 5 MG tablet Commonly known as:  ARICEPT Take 1 tablet (5 mg total) by mouth at bedtime.   enoxaparin 40 MG/0.4ML injection Commonly known as:  LOVENOX Inject 0.4 mLs (40 mg total) into the skin daily.   lisinopril-hydrochlorothiazide 20-25 MG tablet Commonly known as:  PRINZIDE,ZESTORETIC TAKE 1 TABLET BY MOUTH EVERY DAY.   METAMUCIL PO Take 6-7 capsules by mouth daily.   oxybutynin 10 MG 24 hr tablet Commonly known as:  DITROPAN-XL Take 1 tablet (10 mg total) by mouth at bedtime.   oxyCODONE 5 MG immediate release tablet Commonly known as:  Oxy IR/ROXICODONE Take 1 tablet (5 mg total) by mouth every 3 (three) hours as needed for moderate pain ((score 4 to 6)).   PATADAY 0.2 % Soln Generic drug:  Olopatadine HCl Apply 1 drop to eye  daily.   pravastatin 10 MG tablet Commonly known as:  PRAVACHOL TAKE 1 TABLET BY MOUTH EVERY NIGHT AT BEDTIME   PRESERVISION AREDS Caps Take 2 capsules by mouth daily.   sertraline 50 MG tablet Commonly known as:  ZOLOFT Take 1 tablet (50 mg total) by mouth daily.   traMADol 50 MG tablet Commonly known as:  ULTRAM Take 1-2 tablets (50-100 mg total) by mouth every 4 (four) hours as needed for moderate pain.   VITAMIN B COMPLEX PO Take 1 tablet by mouth daily.   vitamin E 400 UNIT capsule Take 400 Units by mouth daily.            Durable Medical Equipment        Start     Ordered   03/24/17 1632  DME Walker rolling  Once    Question:  Patient needs a walker to treat with the following condition  Answer:  Total knee replacement status   03/24/17 1631   03/24/17 1632  DME Bedside commode  Once    Question:  Patient needs a bedside commode to treat with the following condition  Answer:  Total knee replacement status   03/24/17 1631      Diagnostic Studies: Dg Pelvis 1-2 Views  Result Date: 03/09/2017 CLINICAL DATA:  Fall 2 days ago, pt unable to walk since. Bilateral knee pain, imaging performed earlier today. EXAM: PELVIS - 1-2 VIEW COMPARISON:  None. FINDINGS: Generalized osteopenia. No acute fracture or dislocation. Moderate osteoarthritis of bilateral hips, right greater than left. IMPRESSION: No acute osseous injury of the pelvis. Given the patient's age and osteopenia, if there is persistent clinical concern for an occult hip fracture, a MRI of the hip is recommended for increased sensitivity. Electronically Signed   By: Elige Ko   On: 03/09/2017 13:35   Dg Knee Complete 4 Views Left  Result Date: 03/09/2017 CLINICAL DATA:  83 year old female with history of bilateral knee pain after a fall 2 days ago. EXAM: LEFT KNEE - COMPLETE 4+ VIEW COMPARISON:  No priors. FINDINGS: Four views of the left knee demonstrate postoperative changes of total knee arthroplasty. Both  the femoral and tibial components of the prosthesis appear well seated without definite periprosthetic fracture or other acute complicating features. No acute displaced fracture noted in the visualized portions of  the femur, proximal tibia or fibula. Patella is intact. Soft tissues are unremarkable. IMPRESSION: 1. No acute radiographic abnormality of the left knee. 2. Postop changes of left total knee arthroplasty. Electronically Signed   By: Trudie Reedaniel  Entrikin M.D.   On: 03/09/2017 11:30   Dg Knee Complete 4 Views Right  Result Date: 03/09/2017 CLINICAL DATA:  83 year old female with history of bilateral knee pain after a fall 2 days ago. EXAM: RIGHT KNEE - COMPLETE 4+ VIEW COMPARISON:  Right knee radiograph 09/09/2004. FINDINGS: Four views of the right knee demonstrate postoperative changes of right total knee arthroplasty. Both the femoral and tibial components of the prosthesis appear well seated without definite periprosthetic fracture or other immediate complicating features. There is a small lucency adjacent to the lateral aspect of the tibial prosthesis, which appears minimally increased compared to prior study from 2006. No other lucency is noted to suggest significant hardware loosening. No acute displaced fracture in the visualized bones. Overlying soft tissues are unremarkable. IMPRESSION: 1. No acute radiographic abnormality of the right knee. 2. Status post right total knee arthroplasty. Small left chronic lucency adjacent to the lateral aspect of the tibial prosthesis, minimally increased compared to 2006. Electronically Signed   By: Trudie Reedaniel  Entrikin M.D.   On: 03/09/2017 11:32   Dg Knee Left Port  Result Date: 03/24/2017 CLINICAL DATA:  Status post left total knee replacement. EXAM: PORTABLE LEFT KNEE - 1-2 VIEW COMPARISON:  Radiographs of March 09, 2017. FINDINGS: The femoral and tibial components appear to be well situated. No fracture or dislocation is noted. Surgical drain is seen  anterior to the distal femur. Midline surgical staples are noted. IMPRESSION: Status post left total knee arthroplasty. Electronically Signed   By: Lupita RaiderJames  Green Jr, M.D.   On: 03/24/2017 15:29    Disposition: 03-Skilled Nursing Facility  Discharge Instructions    Diet - low sodium heart healthy    Complete by:  As directed    Increase activity slowly    Complete by:  As directed       Follow-up Information    Tera PartridgeWolfe, Khilee Hendricksen R, PA On 04/08/2017.   Specialty:  Physician Assistant Why:  at 1:45pm Contact information: [address] [facility] [place] [id number] [phone]        Donato HeinzHooten, James P, MD On 05/06/2017.   Specialty:  Orthopedic Surgery Why:  at 10:00am Contact information: [address] [facility] [place] [id number] [phone]            Signed: Tera PartridgeWOLFE,Anaiz Qazi R. 03/25/2017, 7:44 AM

## 2017-03-25 NOTE — Evaluation (Signed)
Occupational Therapy Evaluation Patient Details Name: Sherry Brooks MRN: [id number] DOB: [birth year] Today's Date: 03/25/2017    History of Present Illness Pt. is an 83 y.o. female who was admitted to [facility] for a LTKR. Pt. PMHx includes: UTI, Diverticulitis, CKD, Lymphadema, SOB, Morbid Obesity, Spondylolithesis, TypeII DM, Anxiety Depression, Osteoporosis, Subclinical Hypothyroidism, MAcular Degeneration, Hypercholesterolemia.   Clinical Impression    Pt. is an 83 y.o. female who was admitted to [facility] for a Left TKR. Pt. was previously at [facility] just before this most recent admission. Pt. was previously residing at home alone. Pt. required assistance with ADLs, and IADLs. Pt. had personal caregivers assist with cleaning, and meal preparation. Pt.'s daughter assisted pt. with setting up a pillbox on a weekly basis. Pt. presents with macular degeneration, pain, weakness, and limited functional mobility which hinders her ability to complete basic ADL, and IADL tasks. Pt. Could benefit from skilled OT services for improved ADLs, and IADLs.    Follow Up Recommendations  SNF    Equipment Recommendations       Recommendations for Other Services       Precautions / Restrictions Precautions Precautions: Fall Required Braces or Orthoses: Knee Immobilizer - Left Knee Immobilizer - Left: Discontinue once straight leg raise with < 10 degree lag Restrictions Weight Bearing Restrictions: Yes LUE Weight Bearing: Weight bearing as tolerated             ADL either performed or assessed with clinical judgement   ADL Overall ADL's : Needs assistance/impaired Eating/Feeding: Set up   Grooming: Minimal assistance   Upper Body Bathing: Minimal assistance   Lower Body Bathing: Maximal assistance   Upper Body Dressing : Minimal assistance   Lower Body Dressing: Maximal assistance               Functional mobility during ADLs: Moderate assistance General ADL Comments: Pt. education  was provided about A?E use for LE ADLs, and visual compensatory techniques     Vision Baseline Vision/History: Macular Degeneration Patient Visual Report: No change from baseline Vision Assessment?: Vision impaired- to be further tested in functional context     Perception     Praxis      Pertinent Vitals/Pain Pain Assessment: 0-10 Pain Score: 4  Pain Location: L knee Pain Descriptors / Indicators: Aching Pain Intervention(s): Monitored during session;Limited activity within patient's tolerance     Hand Dominance Right   Extremity/Trunk Assessment Upper Extremity Assessment Upper Extremity Assessment: Generalized weakness       Communication Communication Communication: Other (comment)   Cognition Arousal/Alertness: Awake/alert Behavior During Therapy: WFL for tasks assessed/performed Overall Cognitive Status: Within Functional Limits for tasks assessed                                     General Comments       Exercises   Shoulder Instructions      Home Living Family/patient expects to be discharged to:: Skilled nursing facility Living Arrangements: Alone Available Help at Discharge: Available 24 hours/day Type of Home: Skilled Nursing Facility Home Access: Level entry Entrance Stairs-Number of Steps: 3 Entrance Stairs-Rails: Can reach both Home Layout: One level               Home Equipment: Walker - 4 wheels;Bedside commode;Grab bars - tub/shower          Prior Functioning/Environment Level of Independence: Independent with assistive device(s)  Comments: Patient had been ambulatory in the house with a RW, however she had a fall on Friday and is having difficulty ambulating since that time.         OT Problem List: Decreased strength;Decreased range of motion;Decreased activity tolerance;Impaired vision/perception;Impaired UE functional use;Decreased safety awareness;Pain      OT Treatment/Interventions: Self-care/ADL  training;Therapeutic exercise;Therapeutic activities;DME and/or AE instruction;Patient/family education;Energy conservation    OT Goals(Current goals can be found in the care plan section) Acute Rehab OT Goals Patient Stated Goal: To  return to [facility] OT Goal Formulation: With patient Potential to Achieve Goals: Good  OT Frequency: Min 1X/week   Barriers to D/C:            Co-evaluation              AM-PAC PT "6 Clicks" Daily Activity     Outcome Measure Help from another person eating meals?: A Little Help from another person taking care of personal grooming?: A Little Help from another person toileting, which includes using toliet, bedpan, or urinal?: A Lot Help from another person bathing (including washing, rinsing, drying)?: A Lot Help from another person to put on and taking off regular upper body clothing?: A Little Help from another person to put on and taking off regular lower body clothing?: A Lot 6 Click Score: 15   End of Session    Activity Tolerance: Patient tolerated treatment well Patient left: in chair;with call bell/phone within reach;with chair alarm set  OT Visit Diagnosis: Muscle weakness (generalized) (M62.81)                Time: [id number] OT Time Calculation (min): 39 min Charges:  OT General Charges $OT Visit: 1 Visit OT Evaluation $OT Eval Moderate Complexity: 1 Mod G-Codes: OT G-codes **NOT FOR INPATIENT CLASS** Functional Assessment Tool Used: AM-PAC 6 Clicks Daily Activity;Clinical judgement Functional Limitation: Self care Self Care Current Status ([id number]): At least 60 percent but less than 80 percent impaired, limited or restricted Self Care Goal Status ([id number]): At least 20 percent but less than 40 percent impaired, limited or restricted   Olegario MessierElaine Meika Earll, MS, OTR/L   Olegario MessierElaine Daemyn Gariepy, MS, OTR/L 03/25/2017, 2:56 PM

## 2017-03-25 NOTE — Progress Notes (Signed)
   Subjective: 1 Day Post-Op Procedure(s) (LRB): TOTAL KNEE REVISION (Left) Patient reports pain as 6 on 0-10 scale.   Patient is well, and has had no acute complaints or problems We will start therapy today.  Plan is to go Rehab after hospital stay. no nausea and no vomiting Patient denies any chest pains or shortness of breath. Objective: Vital signs in last 24 hours: Temp:  [97.4 F (36.3 C)-98.5 F (36.9 C)] 97.9 F (36.6 C) (10/23 0732) Pulse Rate:  [50-73] 50 (10/23 0732) Resp:  [12-21] 16 (10/23 0732) BP: (121-205)/(50-71) 150/70 (10/23 0732) SpO2:  [95 %-100 %] 100 % (10/23 0732) FiO2 (%):  [2 %] 2 % (10/22 1630) Weight:  [94.3 kg (208 lb)-95.2 kg (209 lb 14.1 oz)] 95.2 kg (209 lb 14.1 oz) (10/22 1630) Heels are non tender and elevated off the bed using rolled towels  Intake/Output from previous day: 10/22 0701 - 10/23 0700 In: 3638.3 [P.O.:840; I.V.:1608.3; IV Piggyback:1190] Out: 1997 [Urine:1800; Drains:122; Blood:75] Intake/Output this shift: No intake/output data recorded.  No results for input(s): HGB in the last 72 hours. No results for input(s): WBC, RBC, HCT, PLT in the last 72 hours. No results for input(s): NA, K, CL, CO2, BUN, CREATININE, GLUCOSE, CALCIUM in the last 72 hours. No results for input(s): LABPT, INR in the last 72 hours.  EXAM General - Patient is Alert, Appropriate and Oriented Extremity - Neurologically intact Neurovascular intact Sensation intact distally Intact pulses distally Dorsiflexion/Plantar flexion intact Incision: dressing C/D/I Compartment soft Dressing - dressing C/D/I Motor Function - intact, moving foot and toes well on exam.    Past Medical History:  Diagnosis Date  . Anxiety   . Arthritis    knees  . Borderline diabetic   . Complication of anesthesia    wakes up slowly, takes longer than usual to wake  . Depression   . Diabetes (HCC)   . GERD (gastroesophageal reflux disease)    on occas. uses TUMS for  indigestion   . Heart murmur   . History of kidney stones   . History of stress test    10 yrs. ago, states it was normal , had been ordered for pt. due to stress related to husband's illness,, by Dr. Vic RipperNancy Malone  . Hyperlipemia   . Hypertension   . Macular degeneration   . Peripheral vascular disease (HCC)    blood clots in legs mid 2000's   . Renal calculus    history of > 50 yrs. ago    Assessment/Plan: 1 Day Post-Op Procedure(s) (LRB): TOTAL KNEE REVISION (Left) Active Problems:   S/P total knee arthroplasty  Estimated body mass index is 38.39 kg/m as calculated from the following:   Height as of this encounter: 5\' 2"  (1.575 m).   Weight as of this encounter: 95.2 kg (209 lb 14.1 oz). Advance diet Up with therapy D/C IV fluids Discharge to SNF  Labs: As of this dictation were not back yet DVT Prophylaxis - Lovenox, Foot Pumps and TED hose Weight-Bearing as tolerated to left leg D/C O2 and Pulse OX and try on Room [facility] tomorrow morning Begin working on bowel movement  Devinne Epstein R. [facility]Wolfe PA [facility] 03/25/2017, 7:37 AM

## 2017-03-25 NOTE — Anesthesia Postprocedure Evaluation (Signed)
Anesthesia Post Note  Patient: Sherry Brooks  Procedure(s) Performed: TOTAL KNEE REVISION (Left Knee)  Patient location during evaluation: Nursing Unit Anesthesia Type: Spinal Level of consciousness: awake and alert and oriented Pain management: satisfactory to patient Vital Signs Assessment: post-procedure vital signs reviewed and stable Respiratory status: respiratory function stable Cardiovascular status: stable Postop Assessment: no headache, no backache, spinal receding, patient able to bend at knees, no apparent nausea or vomiting and adequate PO intake Anesthetic complications: no     Last Vitals:  Vitals:   03/24/17 2342 03/25/17 0504  BP: (!) 134/54 (!) 121/50  Pulse: (!) 56 (!) 51  Resp: 19 19  Temp: 36.5 C 36.5 C  SpO2: 96% 95%    Last Pain:  Vitals:   03/25/17 0609  TempSrc:   PainSc: 3                  Clydene PughBeane, Yoland Scherr D

## 2017-03-25 NOTE — Clinical Social Work Note (Addendum)
Clinical Social Work Assessment  Patient Details  Name: Sherry Brooks MRN: [id number] Date of Birth: Apr 25, 1929  Date of referral:  03/25/17               Reason for consult:  Discharge Planning, Facility Placement, Brooks Management Concerns                Permission sought to share information with:  Sherry Brooks granted to share information::  Yes, Verbal Permission Granted  Name::      Sherry Brooks::   Sherry Brooks  Relationship::     Contact Information:     Housing/Transportation Living arrangements for the past 2 months:  Sherry Brooks, Sherry Brooks of Information:  Patient, Power of Attorney Patient Interpreter Needed:  None Criminal Activity/Legal Involvement Pertinent to Current Situation/Hospitalization:  No - Comment as needed Significant Relationships:  Other Family Members Lives with:  Facility Resident Do you feel safe going back to the place where you live?  Yes Need for family participation in patient Brooks:  Yes (Comment)  Brooks giving concerns:  Patient has been at [facility] for a few weeks for short term rehab.   Social Worker assessment / plan:  Holiday representative (CSW) received SNF consult. Per chart patient is from [place]. Social work Theatre manager met with patient and daughter-in-law/ [name] [phone]) at bedside. Patient was sitting up in the bed and was alert. Social work Theatre manager introduced self and explained the role of the Sherry Brooks. Patients daughter-in-law Sherry Brooks stated that patient has been living at [facility] since 03/13/17 and prior to that was living alone in [facility] also stated that patient is in transition to becoming a long term Brooks resident at [facility]. Social work Theatre manager explained that a new [employer] authorization will have to be received before patient can return to [facility]. Patient and daughter-in-law Sherry Brooks verbalized their understanding. FL2  completed and faxed out.  Per [facility] admissions coordinator at [facility] patient is a short term rehab resident and can return if a new Sherry Brooks SNF authorization is received. CSW and Social work Theatre manager will continue to follow up and assist as needed.  Employment status:  Retired Nurse, adult PT Recommendations:  Sherry Brooks / Referral to community resources:  Sherry Brooks  Patient/Family's Response to Brooks:  Patient and her daughter-in-law are agreeable for patient to go back to [facility].  Patient/Family's Understanding of and Emotional Response to Diagnosis, Current Treatment, and Prognosis:  Patient and her daughter-in-law were both pleasant and thanked Social work Theatre manager for her assistance.  Emotional Assessment Appearance:  Appears stated age Attitude/Demeanor/Rapport:    Affect (typically observed):  Accepting, Calm, Pleasant Orientation:  Oriented to Self, Oriented to Place, Oriented to Situation Alcohol / Substance use:  Not Applicable Psych involvement (Current and /or in the community):  No (Comment)  Discharge Needs  Concerns to be addressed:  Discharge Planning Concerns, Brooks Coordination Readmission within the last 30 days:  Yes Current discharge risk:  Dependent with Mobility Barriers to Discharge:  Continued Medical Work up   Sherry Brooks, Student-Social Work 03/25/2017, 9:28 AM

## 2017-03-25 NOTE — Progress Notes (Signed)
[facility].   Baker Hughes IncorporatedBailey Yanel Dombrosky, LCSW [phone]

## 2017-03-25 NOTE — Evaluation (Signed)
Physical Therapy Evaluation Patient Details Name: Sherry Brooks MRN: [id number] DOB: 1929-01-09 Today's Date: 03/25/2017   History of Present Illness  Pt. is an 83 y.o. female who was admitted to [facility] for a LTKR. Pt. PMHx includes: UTI, Diverticulitis, CKD, Lymphadema, SOB, Morbid Obesity, Spondylolithesis, TypeII DM, Anxiety Depression, Osteoporosis, Subclinical Hypothyroidism, MAcular Degeneration, Hypercholesterolemia.  Clinical Impression  Pt is an 83 year old female s/p L TKA.  Pt previously used a Rollator walker for mobility and has been living in an assisted living facility due to decreased mobility and having no family nearby to help care for her.  Pt required mod to max assist for bed mobility and VC's from PT for use of AD.  Pt demonstrated decreased knee flexion during ROM measurement and inability to perform SLR due to muscle weakness.  Pt was able to stand with support of RW and VC's for focus on quad control.  Transfer from bed to chair required increased time and VC's for sequencing.  Pt will benefit from continuation of skilled PT for strengthening, continued improvement of ROM and functional mobility.    Follow Up Recommendations SNF    Equipment Recommendations       Recommendations for Other Services       Precautions / Restrictions Precautions Precautions: Fall Required Braces or Orthoses: Knee Immobilizer - Left Knee Immobilizer - Left: Discontinue once straight leg raise with < 10 degree lag Restrictions Weight Bearing Restrictions: Yes LUE Weight Bearing: Weight bearing as tolerated      Mobility  Bed Mobility Overal bed mobility: Needs Assistance Bed Mobility: Rolling;Supine to Sit Rolling: Mod assist   Supine to sit: Mod assist     General bed mobility comments: Pt uses bedrail and hand held assist from PT to perform supine to sit.  PT assisted pt in scooting to EOB using draw sheet.  Transfers Overall transfer level: Needs  assistance Equipment used: Rolling walker (2 wheeled) Transfers: Sit to/from [employer] Sit to Stand: Mod assist Stand pivot transfers: Mod assist       General transfer comment: PT provided VC's for sequencing, safe use of RW and proper placement for descend into chair.  Pt is able to stand using RW with min wt. bearing on L LE adn VC's to "keep the L knee strong".  Ambulation/Gait Ambulation/Gait assistance: Mod assist Ambulation Distance (Feet): 5 Feet Assistive device: Rolling walker (2 wheeled) Gait Pattern/deviations: Step-to pattern   Gait velocity interpretation: Below normal speed for age/gender General Gait Details: Pt required cueing for upright posture and management of RW. Pt demonstrated decreased knee flexion and foot clearance.  Stairs            Wheelchair Mobility    Modified Rankin (Stroke Patients Only)       Balance Overall balance assessment: Needs assistance Sitting-balance support: Feet supported;Bilateral upper extremity supported   Sitting balance - Comments: Pt requried VC's for upright posture but was able to stabilize herself once PT assisted to EOB. Postural control: Right lateral lean Standing balance support: Bilateral upper extremity supported                 High level balance activites: Side stepping High Level Balance Comments: Requires use of RW, close CGA and VC's for sequencing.             Pertinent Vitals/Pain Pain Assessment: 0-10 Pain Score: 4  Pain Location: L knee Pain Descriptors / Indicators: Aching Pain Intervention(s): Monitored during session;Limited activity within patient's tolerance  Home Living Family/patient expects to be discharged to:: Skilled nursing facility Living Arrangements: Alone Available Help at Discharge: Available 24 hours/day Type of Home: Skilled Nursing Facility Home Access: Level entry Entrance Stairs-Rails: Can reach both Entrance Stairs-Number of Steps:  3 Home Layout: One level Home Equipment: Walker - 4 wheels;Bedside commode;Grab bars - tub/shower      Prior Function Level of Independence: Independent with assistive device(s)         Comments: Patient had been ambulatory in the house with a RW, however she had a fall on Friday and is having difficulty ambulating since that time.      Hand Dominance   Dominant Hand: Right    Extremity/Trunk Assessment   Upper Extremity Assessment Upper Extremity Assessment: Generalized weakness    Lower Extremity Assessment Lower Extremity Assessment: Generalized weakness;LLE deficits/detail LLE Deficits / Details: Knee Ext./Flex.: L: -3-72 deg.  Pt was unable to perform active SLR with L LE due to generalized weakness.  AAROM was needed when eccentric control SLR's were performed.       Communication   Communication: Other (comment)  Cognition Arousal/Alertness: Awake/alert Behavior During Therapy: WFL for tasks assessed/performed Overall Cognitive Status: Within Functional Limits for tasks assessed                                        General Comments      Exercises Total Joint Exercises Ankle Circles/Pumps: AROM;Left;10 reps;Supine Quad Sets: AROM;5 reps;Supine Short Arc Quad: AROM;10 reps;Supine Heel Slides: AAROM;5 reps;Supine Hip ABduction/ADduction: AAROM;10 reps;Supine Straight Leg Raises: AAROM;10 reps;Supine   Assessment/Plan    PT Assessment Patient needs continued PT services  PT Problem List Decreased strength;Decreased range of motion;Decreased activity tolerance;Decreased balance;Decreased mobility;Pain       PT Treatment Interventions DME instruction;Gait training;Functional mobility training;Therapeutic activities;Therapeutic exercise;Balance training;Patient/family education    PT Goals (Current goals can be found in the Care Plan section)  Acute Rehab PT Goals Patient Stated Goal: To  return to [facility]    Frequency BID    Barriers to discharge        Co-evaluation               AM-PAC PT "6 Clicks" Daily Activity  Outcome Measure Difficulty turning over in bed (including adjusting bedclothes, sheets and blankets)?: A Lot Difficulty moving from lying on back to sitting on the side of the bed? : A Lot Difficulty sitting down on and standing up from a chair with arms (e.g., wheelchair, bedside commode, etc,.)?: A Lot Help needed moving to and from a bed to chair (including a wheelchair)?: A Lot Help needed walking in hospital room?: A Lot Help needed climbing 3-5 steps with a railing? : Total 6 Click Score: 11    End of Session Equipment Utilized During Treatment: Gait belt Activity Tolerance: Patient tolerated treatment well;Patient limited by fatigue Patient left: in chair;with call bell/phone within reach;with chair alarm set;with family/visitor present   PT Visit Diagnosis: Unsteadiness on feet (R26.81);Other abnormalities of gait and mobility (R26.89);Muscle weakness (generalized) (M62.81);Pain Pain - Right/Left: Left Pain - part of body: Knee    Time: 1105-1140 PT Time Calculation (min) (ACUTE ONLY): 35 min   Charges:   PT Evaluation $PT Eval Moderate Complexity: 1 Mod PT Treatments $Gait Training: 8-22 mins $Therapeutic Exercise: 8-22 mins   PT G Codes:   PT G-Codes **NOT FOR INPATIENT CLASS** Functional Assessment Tool Used:  AM-PAC 6 Clicks Basic Mobility Functional Limitation: Mobility: Walking and moving around;Changing and maintaining body position Mobility: Walking and Moving Around Current Status [phone](G8978): At least 80 percent but less than 100 percent impaired, limited or restricted Mobility: Walking and Moving Around Goal Status [phone](G8979): At least 1 percent but less than 20 percent impaired, limited or restricted    Glenetta HewSarah Shepard Keltz, PT, DPT  Glenetta HewSarah Henreitta Spittler 03/25/2017, 2:12 PM

## 2017-03-25 NOTE — Progress Notes (Signed)
Physical Therapy Treatment Patient Details Name: Sherry Brooks L Sing MRN: [id number] DOB: 1929-01-29 Today's Date: 03/25/2017    History of Present Illness Pt was admitted to hospital s/p L TKA.    PT Comments    Pt was able to progress during treatment by increasing ambulatory distance and progressing to seated exercises.  Pt does not report a high pain level but is very cautious and reports fear of falling.  Pt requires frequent VC's for proper use of RW and upright posture.  PT followed behind pt with chair during ambulation in room due to pt becoming fatigued every five steps or so.  Pt is able to follow VC's to complete HEP but will not be able to see handout due to reported macular degeneration.  Pt will continue to benefit from skilled PT with focus on strengthening, knee flexion and extension ROM and increased tolerance to general activity.   Follow Up Recommendations  SNF     Equipment Recommendations       Recommendations for Other Services       Precautions / Restrictions Precautions Precautions: Fall;Knee Required Braces or Orthoses: Knee Immobilizer - Left Restrictions Weight Bearing Restrictions: Yes LUE Weight Bearing: Weight bearing as tolerated    Mobility  Bed Mobility                  Transfers Overall transfer level: Needs assistance Equipment used: Rolling walker (2 wheeled) Transfers: Sit to/from Stand Sit to Stand: Max assist            Ambulation/Gait Ambulation/Gait assistance: +2 physical assistance;Mod assist Ambulation Distance (Feet): 10 Feet Assistive device: Rolling walker (2 wheeled) Gait Pattern/deviations: Step-to pattern;Decreased step length - right;Decreased stance time - left;Decreased stride length   Gait velocity interpretation: Below normal speed for age/gender General Gait Details: Decreased gait speed, foot clearance and requires VC's for upright posture and proper use of RW.  PT followed behind pt with chair to  assist when pt became fatigued.  Pt reports fear of falling.   Stairs            Wheelchair Mobility    Modified Rankin (Stroke Patients Only)       Balance Overall balance assessment: Needs assistance Sitting-balance support: Bilateral upper extremity supported;Feet supported     Postural control: Other (comment) (Pt remaind in forward flexion unless cued for correction.) Standing balance support: Bilateral upper extremity supported                                Cognition Arousal/Alertness: Awake/alert Behavior During Therapy: WFL for tasks assessed/performed Overall Cognitive Status: Within Functional Limits for tasks assessed                                        Exercises Total Joint Exercises Ankle Circles/Pumps: 10 reps;Seated;AROM Short Arc Quad: Left;10 reps;Seated;AROM Hip ABduction/ADduction: Left;AROM;10 reps;Seated Knee Flexion: AROM;Left;10 reps;Seated General Exercises - Lower Extremity Hip Flexion/Marching: AROM;Both;10 reps;Seated    General Comments        Pertinent Vitals/Pain Pain Assessment: 0-10 Pain Score: 5  Pain Location: L knee Pain Descriptors / Indicators: Aching Pain Intervention(s): Limited activity within patient's tolerance    Home Living Family/patient expects to be discharged to:: Skilled nursing facility Living Arrangements: Alone Available Help at Discharge: Available 24 hours/day Type of Home: Skilled Nursing Facility Home  Access: Level entry Entrance Stairs-Rails: Can reach both Home Layout: One level Home Equipment: Walker - 4 wheels;Bedside commode;Grab bars - tub/shower      Prior Function Level of Independence: Independent with assistive device(s)      Comments: Patient had been ambulatory in the house with a RW, however she had a fall on Friday and is having difficulty ambulating since that time.    PT Goals (current goals can now be found in the care plan section) Acute Rehab  PT Goals Patient Stated Goal: To be able to walk with less pain. PT Goal Formulation: With patient Time For Goal Achievement: 04/08/17 Potential to Achieve Goals: Good Progress towards PT goals: Progressing toward goals    Frequency    BID      PT Plan Current plan remains appropriate    Co-evaluation              AM-PAC PT "6 Clicks" Daily Activity  Outcome Measure                   End of Session Equipment Utilized During Treatment: Gait belt Activity Tolerance: Patient limited by fatigue;No increased pain Patient left: in chair;with call bell/phone within reach;with chair alarm set;with family/visitor present   PT Visit Diagnosis: Unsteadiness on feet (R26.81);Muscle weakness (generalized) (M62.81);Difficulty in walking, not elsewhere classified (R26.2);Pain Pain - Right/Left: Left Pain - part of body: Knee     Time: 1415-1440 PT Time Calculation (min) (ACUTE ONLY): 25 min  Charges:  $Gait Training: 8-22 mins $Therapeutic Exercise: 8-22 mins                    G Codes:  Functional Assessment Tool Used: AM-PAC 6 Clicks Basic Mobility Functional Limitation: Mobility: Walking and moving around;Changing and maintaining body position Mobility: Walking and Moving Around Current Status [phone]): At least 80 percent but less than 100 percent impaired, limited or restricted Mobility: Walking and Moving Around Goal Status [phone]): At least 1 percent but less than 20 percent impaired, limited or restricted    Sherry Brooks, PT, DPT    Sherry Brooks 03/25/2017, 4:58 PM

## 2017-03-26 LAB — BASIC METABOLIC PANEL
ANION GAP: 7 (ref 5–15)
BUN: 22 mg/dL — AB (ref 6–20)
CO2: 26 mmol/L (ref 22–32)
Calcium: 9 mg/dL (ref 8.9–10.3)
Chloride: 105 mmol/L (ref 101–111)
Creatinine, Ser: 1.11 mg/dL — ABNORMAL HIGH (ref 0.44–1.00)
GFR calc Af Amer: 51 mL/min — ABNORMAL LOW (ref >60)
GFR, EST NON AFRICAN AMERICAN: 44 mL/min — AB (ref >60)
Glucose, Bld: 114 mg/dL — ABNORMAL HIGH (ref 65–99)
POTASSIUM: 3.9 mmol/L (ref 3.5–5.1)
Sodium: 138 mmol/L (ref 135–145)

## 2017-03-26 LAB — CBC
HCT: 35.4 % (ref 35.0–47.0)
Hemoglobin: 11.9 g/dL — ABNORMAL LOW (ref 12.0–16.0)
MCH: 29.3 pg (ref 26.0–34.0)
MCHC: 33.7 g/dL (ref 32.0–36.0)
MCV: 86.8 fL (ref 80.0–100.0)
PLATELETS: 303 10*3/uL (ref 150–440)
RBC: 4.07 MIL/uL (ref 3.80–5.20)
RDW: 14.1 % (ref 11.5–14.5)
WBC: 6.4 10*3/uL (ref 3.6–11.0)

## 2017-03-26 LAB — SURGICAL PATHOLOGY

## 2017-03-26 MED ORDER — LACTULOSE 10 GM/15ML PO SOLN
10.0000 g | Freq: Two times a day (BID) | ORAL | Status: DC | PRN
  Administered 2017-03-26: 10 g via ORAL
  Filled 2017-03-26: qty 30

## 2017-03-26 NOTE — Progress Notes (Signed)
Physical Therapy Treatment Patient Details Name: Sherry Brooks MRN: [id number] DOB: 1928-08-07 Today's Date: 03/26/2017    History of Present Illness Pt was admitted to hospital s/p L TKA.    PT Comments    Pt was able to progress to seated exercises today as well as improving STS transfer.  Pt is still hesitant during ambulation, requiring several VC's for placement of UE and RW.  PT educated pt concerning HEP and provided manual cues as needed due to pt not being able to see the HEP handout.  PT will try a shorter walker during afternoon treatment in hopes of improving body mechanics and increasing distance of ambulation.   Follow Up Recommendations  SNF     Equipment Recommendations       Recommendations for Other Services       Precautions / Restrictions Precautions Precautions: Knee;Fall Restrictions Weight Bearing Restrictions: Yes LUE Weight Bearing: Weight bearing as tolerated LLE Weight Bearing: Weight bearing as tolerated    Mobility  Bed Mobility Overal bed mobility: Needs Assistance Bed Mobility: Sidelying to Sit   Sidelying to sit: Mod assist Supine to sit: Mod assist     General bed mobility comments: PT used draw  sheet to square pt hips at EOB but pt was able to perform the rest of supine to sit with use of bed rail.  Transfers Overall transfer level: Needs assistance Equipment used: Rolling walker (2 wheeled) Transfers: Sit to/from Stand Sit to Stand: Mod assist         General transfer comment: STS improves each time pt performs.  She required 1 person min assist by the 3rd transfer.  Ambulation/Gait Ambulation/Gait assistance: Min assist Ambulation Distance (Feet): 10 Feet Assistive device: Rolling walker (2 wheeled) Gait Pattern/deviations: Step-to pattern   Gait velocity interpretation: Below normal speed for age/gender General Gait Details: Pt requries VC's for placement of RW and advancement of R LE in swing  phase.   Stairs            Wheelchair Mobility    Modified Rankin (Stroke Patients Only)       Balance                                            Cognition Arousal/Alertness: Awake/alert Behavior During Therapy: WFL for tasks assessed/performed Overall Cognitive Status: Within Functional Limits for tasks assessed                                        Exercises Total Joint Exercises Ankle Circles/Pumps: AROM;10 reps;Seated Quad Sets: Strengthening;Left;15 reps;Seated Short Arc Quad: Strengthening;Left;15 reps;Seated Hip ABduction/ADduction: Strengthening;Left;15 reps;Seated    General Comments        Pertinent Vitals/Pain Pain Assessment: 0-10 Pain Score: 5  Pain Location: L knee Pain Descriptors / Indicators: Aching    Home Living                      Prior Function            PT Goals (current goals can now be found in the care plan section) Acute Rehab PT Goals Patient Stated Goal: To return home and regain strength. PT Goal Formulation: With patient Time For Goal Achievement: 04/09/17 Potential to Achieve Goals: Good Progress towards PT goals:  Progressing toward goals    Frequency    BID      PT Plan      Co-evaluation              AM-PAC PT "6 Clicks" Daily Activity  Outcome Measure  Difficulty turning over in bed (including adjusting bedclothes, sheets and blankets)?: A Lot Difficulty moving from lying on back to sitting on the side of the bed? : A Lot Difficulty sitting down on and standing up from a chair with arms (e.g., wheelchair, bedside commode, etc,.)?: A Lot Help needed moving to and from a bed to chair (including a wheelchair)?: A Lot Help needed walking in hospital room?: A Lot Help needed climbing 3-5 steps with a railing? : Total 6 Click Score: 11    End of Session Equipment Utilized During Treatment: Gait belt Activity Tolerance: Patient limited by fatigue Patient  left: in chair;with call bell/phone within reach;with chair alarm set   PT Visit Diagnosis: Unsteadiness on feet (R26.81);Other abnormalities of gait and mobility (R26.89);Muscle weakness (generalized) (M62.81);Pain Pain - Right/Left: Left Pain - part of body: Knee     Time: 1610-9604 PT Time Calculation (min) (ACUTE ONLY): 35 min  Charges:  $Gait Training: 8-22 mins $Therapeutic Exercise: 8-22 mins                    G Codes:  Functional Assessment Tool Used: AM-PAC 6 Clicks Basic Mobility Functional Limitation: Mobility: Walking and moving around;Changing and maintaining body position    Sherry Brooks, PT, DPT    Sherry Brooks 03/26/2017, 1:11 PM

## 2017-03-26 NOTE — Progress Notes (Addendum)
   Subjective: 2 Days Post-Op Procedure(s) (LRB): TOTAL KNEE REVISION (Left) Patient reports pain as 7 on 0-10 scale.   Patient is well, and has had no acute complaints or problems Patient detail with therapy yesterday. Patient sleeping well. No complaints other than being cold Plan is to go Rehab after hospital stay. no nausea and no vomiting Patient denies any chest pains or shortness of breath. Objective: Vital signs in last 24 hours: Temp:  [97.6 F (36.4 C)-98.6 F (37 C)] 98.6 F (37 C) (10/24 0505) Pulse Rate:  [50-64] 55 (10/24 0505) Resp:  [16-18] 18 (10/24 0505) BP: (126-150)/(49-70) 147/52 (10/24 0505) SpO2:  [97 %-100 %] 98 % (10/24 0505) well approximated incision Heels are non tender and elevated off the bed using rolled towels Intake/Output from previous day: 10/23 0701 - 10/24 0700 In: 2880 [P.O.:960; I.V.:1920] Out: 460 [Urine:350; Drains:110] Intake/Output this shift: Total I/O In: 2280 [P.O.:360; I.V.:1920] Out: 360 [Urine:250; Drains:110]   Recent Labs  03/25/17 1143  HGB 11.8*    Recent Labs  03/25/17 1143  WBC 9.9  RBC 4.07  HCT 35.4  PLT 317    Recent Labs  03/25/17 1143  NA 133*  K 3.6  CL 100*  CO2 25  BUN 21*  CREATININE 1.24*  GLUCOSE 150*  CALCIUM 9.1   No results for input(s): LABPT, INR in the last 72 hours.  EXAM General - Patient is Alert, Appropriate and Oriented Extremity - Neurologically intact Neurovascular intact Sensation intact distally Intact pulses distally Dorsiflexion/Plantar flexion intact No cellulitis present Compartment soft Dressing - scant drainage Motor Function - intact, moving foot and toes well on exam. Moving knee will still unable to do a straight leg raise on her own.  Past Medical History:  Diagnosis Date  . Anxiety   . Arthritis    knees  . Borderline diabetic   . Complication of anesthesia    wakes up slowly, takes longer than usual to wake  . Depression   . Diabetes (HCC)    . GERD (gastroesophageal reflux disease)    on occas. uses TUMS for indigestion   . Heart murmur   . History of kidney stones   . History of stress test    10 yrs. ago, states it was normal , had been ordered for pt. due to stress related to husband's illness,, by Dr. Vic RipperNancy Malone  . Hyperlipemia   . Hypertension   . Macular degeneration   . Peripheral vascular disease (HCC)    blood clots in legs mid 2000's   . Renal calculus    history of > 50 yrs. ago    Assessment/Plan: 2 Days Post-Op Procedure(s) (LRB): TOTAL KNEE REVISION (Left) Active Problems:   S/P total knee arthroplasty  Estimated body mass index is 38.39 kg/m as calculated from the following:   Height as of this encounter: 5\' 2"  (1.575 m).   Weight as of this encounter: 95.2 kg (209 lb 14.1 oz). Up with therapy Plan for discharge tomorrow Discharge to SNF  Labs: None DVT Prophylaxis - Lovenox, Foot Pumps and TED hose Weight-Bearing as tolerated to left leg Patient needs a bowel movement today. Lactulose ordered. Hemovac discontinued on today's visit  Jon R. [facility] HospitalWolfe PA [facility] 03/26/2017, 6:51 AM

## 2017-03-26 NOTE — Progress Notes (Signed)
[facility], auth # [id number], RUB. Blue Medicare case manager is [name] ext. [phone]. Plan [name] pending medical clearance. Patient is aware of above. CS[name] and made her aware of above. CSW contacted patient's daughter in law [name] and made her aware of above.   [name], LCSW [phone]

## 2017-03-26 NOTE — Progress Notes (Signed)
Physical Therapy Treatment Patient Details Name: Sherry Brooks MRN: [id number] DOB: February 09, 1929 Today's Date: 03/26/2017    History of Present Illness Pt admitted to hospital s/p L TKA.    PT Comments    Pt was able to progress therapeutic exercises to standing but did not increase ambulatory distance today.  She states that she is not able to make her R foot progress forward and that her knee feel like it is going to collapse.  PT introduced standing exercises to pt and educated her concerning progression of exercise.  Pt fatigues with prolonged standing and requires frequent cueing to maintain upright posture.  She demonstrated an increase in 5 degrees of flexion of L knee but extension remains the same.  Pt will continue to benefit from skilled PT with focus on increased strength, tolerance to activity and ROM.   Follow Up Recommendations  SNF     Equipment Recommendations       Recommendations for Other Services       Precautions / Restrictions Precautions Precautions: Knee;Fall Restrictions Weight Bearing Restrictions: Yes LLE Weight Bearing: Weight bearing as tolerated    Mobility  Bed Mobility Overal bed mobility: Needs Assistance Bed Mobility: Sidelying to Sit   Sidelying to sit: Mod assist Supine to sit: Mod assist     General bed mobility comments: Did not perform bed mobility.  Transfers Overall transfer level: Needs assistance Equipment used: Rolling walker (2 wheeled) Transfers: Sit to/from Stand Sit to Stand: Min assist         General transfer comment: Pt is able to perform STS with min assist and VC's from PT for body mechanics and upright posture.  Ambulation/Gait Ambulation/Gait assistance: Mod assist Ambulation Distance (Feet): 5 Feet Assistive device: Rolling walker (2 wheeled) Gait Pattern/deviations: Step-to pattern;Decreased step length - right   Gait velocity interpretation: Below normal speed for age/gender General Gait  Details: Pt is very guarded with gait and expresses fear of falling.  Still demonstrates low foot clearance and decreased step length.   Stairs            Wheelchair Mobility    Modified Rankin (Stroke Patients Only)       Balance Overall balance assessment: Needs assistance Sitting-balance support: Bilateral upper extremity supported       Standing balance support: Bilateral upper extremity supported                                Cognition Arousal/Alertness: Awake/alert Behavior During Therapy: WFL for tasks assessed/performed Overall Cognitive Status: Within Functional Limits for tasks assessed                                        Exercises Total Joint Exercises Ankle Circles/Pumps: AROM;20 reps;Seated Quad Sets: Strengthening;Left;10 reps;Seated Gluteal Sets: Strengthening;Both;15 reps Short Arc Quad: Strengthening;Left;15 reps;Seated Hip ABduction/ADduction: AROM;Left;15 reps Long Arc Quad: AROM;15 reps;Seated Marching in Standing: AROM;5 reps;Both Other Exercises Other Exercises: Standing side stepping with RW x10 CGA with VC's for use of UE for support. Other Exercises: Sit to stand x7 min assist and VC's for proper use of UE with RW support.    General Comments        Pertinent Vitals/Pain Pain Assessment: 0-10 Pain Score: 5  Pain Location: L knee Pain Descriptors / Indicators: Aching Pain Intervention(s): Limited activity within patient's tolerance  Home Living                      Prior Function            PT Goals (current goals can now be found in the care plan section) Acute Rehab PT Goals Patient Stated Goal: To return home and be able to walk and do therapy. PT Goal Formulation: With patient Time For Goal Achievement: 04/09/17 Potential to Achieve Goals: Good Progress towards PT goals: Progressing toward goals    Frequency    BID      PT Plan Current plan remains appropriate     Co-evaluation              AM-PAC PT "6 Clicks" Daily Activity  Outcome Measure  Difficulty turning over in bed (including adjusting bedclothes, sheets and blankets)?: A Lot Difficulty moving from lying on back to sitting on the side of the bed? : A Lot Difficulty sitting down on and standing up from a chair with arms (e.g., wheelchair, bedside commode, etc,.)?: A Lot Help needed moving to and from a bed to chair (including a wheelchair)?: A Lot Help needed walking in hospital room?: A Lot Help needed climbing 3-5 steps with a railing? : Total 6 Click Score: 11    End of Session Equipment Utilized During Treatment: Gait belt Activity Tolerance: Patient limited by fatigue Patient left: in chair;with call bell/phone within reach;with chair alarm set Nurse Communication: Mobility status;Precautions PT Visit Diagnosis: Unsteadiness on feet (R26.81);Other abnormalities of gait and mobility (R26.89);Muscle weakness (generalized) (M62.81);Pain Pain - Right/Left: Left Pain - part of body: Knee     Time: 1335-1410 PT Time Calculation (min) (ACUTE ONLY): 35 min  Charges:  $Gait Training: 8-22 mins $Therapeutic Exercise: 8-22 mins $Therapeutic Activity: 8-22 mins                    G Codes:  Functional Assessment Tool Used: AM-PAC 6 Clicks Basic Mobility Functional Limitation: Mobility: Walking and moving around;Changing and maintaining body position    [name], PT, DPT    [name] 03/26/2017, 3:35 PM

## 2017-03-27 MED ORDER — TRAMADOL HCL 50 MG PO TABS: 50.0000 mg | ORAL_TABLET | ORAL | 0 refills | Status: DC | PRN

## 2017-03-27 NOTE — Progress Notes (Signed)
Non emergency transport here to pick up pt for transfer to [facility]. Pt pain controlled on discharge. Vital signs stable. Pt transported without incident per MD order.

## 2017-03-27 NOTE — Progress Notes (Signed)
Physical Therapy Treatment Patient Details Name: Sherry Brooks MRN: [id number] DOB: 04-15-1929 Today's Date: 03/27/2017    History of Present Illness Pt was admitted to hospital following L TKA.    PT Comments    Pt was able to progress knee flexion measurement today but demonstrated more difficulty with bed mobility.  Pt required several attempts to transfer from supine to sitting and stated that she feels weak today.  Pt ambulation required VC's for sequencing and set up to prepare for stand to sit into chair.  Pt was able to advance seated exercises by repetition and functional ROM.  She reported a lower pain level today and only mild soreness following therapeutic exercise.  Pt will continue to benefit from skilled PT with focus on strengthening, functional mobility, gait and tolerance to activity.    Follow Up Recommendations  SNF     Equipment Recommendations       Recommendations for Other Services       Precautions / Restrictions Precautions Precautions: Knee;Fall Restrictions Weight Bearing Restrictions: Yes LLE Weight Bearing: Weight bearing as tolerated    Mobility  Bed Mobility Overal bed mobility: Needs Assistance Bed Mobility: Supine to Sit;Rolling Rolling: Modified independent (Device/Increase time) Sidelying to sit: Mod assist Supine to sit: Mod assist     General bed mobility comments: PT assisted pt using draw sheet and unilateral UE assist.  Transfers Overall transfer level: Needs assistance Equipment used: Rolling walker (2 wheeled) Transfers: Sit to/from Stand Sit to Stand: Min assist         General transfer comment: Pt required VC's for UE placement and avoiding leaning back in sitting position.  Ambulation/Gait Ambulation/Gait assistance: Min assist Ambulation Distance (Feet): 5 Feet Assistive device: Rolling walker (2 wheeled) Gait Pattern/deviations: Step-to pattern;Trunk flexed;Antalgic   Gait velocity interpretation: Below  normal speed for age/gender General Gait Details: Pt still demonstrates apprehension with advancement of the R LE and requires VC's for turning and backing toward chair to perform stand to sit.   Stairs            Wheelchair Mobility    Modified Rankin (Stroke Patients Only)       Balance Overall balance assessment: Needs assistance Sitting-balance support: Bilateral upper extremity supported;Feet supported     Postural control: Posterior lean Standing balance support: Bilateral upper extremity supported                                Cognition Arousal/Alertness: Awake/alert Behavior During Therapy: WFL for tasks assessed/performed Overall Cognitive Status: Within Functional Limits for tasks assessed                                        Exercises Total Joint Exercises Quad Sets: Strengthening;20 reps;Seated;Left Heel Slides: AROM;20 reps;Seated;Left Hip ABduction/ADduction: Strengthening;10 reps;Seated;Left Long Arc Quad: Strengthening;Left;15 reps Knee Flexion: AAROM;20 reps;Seated;Left Goniometric ROM: Knee Ext./Flex.: L: -3-89 Other Exercises Other Exercises: Sit to stand x5 with min assist and use of RW.    General Comments        Pertinent Vitals/Pain Pain Score: 3  Pain Location: L knee Pain Descriptors / Indicators: Aching;Discomfort Pain Intervention(s): Monitored during session    Home Living Family/patient expects to be discharged to:: Skilled nursing facility Living Arrangements: Alone Available Help at Discharge: Skilled Nursing Facility Type of Home: Skilled Nursing Facility  Prior Function            PT Goals (current goals can now be found in the care plan section) Acute Rehab PT Goals Patient Stated Goal: To regain strength and to be able to walk. PT Goal Formulation: With patient Time For Goal Achievement: 04/10/17 Potential to Achieve Goals: Good Progress towards PT goals:  Progressing toward goals    Frequency    BID      PT Plan Current plan remains appropriate    Co-evaluation              AM-PAC PT "6 Clicks" Daily Activity  Outcome Measure  Difficulty turning over in bed (including adjusting bedclothes, sheets and blankets)?: A Lot Difficulty moving from lying on back to sitting on the side of the bed? : A Lot Difficulty sitting down on and standing up from a chair with arms (e.g., wheelchair, bedside commode, etc,.)?: A Little Help needed moving to and from a bed to chair (including a wheelchair)?: A Lot Help needed walking in hospital room?: A Lot Help needed climbing 3-5 steps with a railing? : Total 6 Click Score: 12    End of Session Equipment Utilized During Treatment: Gait belt Activity Tolerance: Patient limited by fatigue Patient left: in chair;with call bell/phone within reach;with chair alarm set   PT Visit Diagnosis: Unsteadiness on feet (R26.81);Muscle weakness (generalized) (M62.81) Pain - Right/Left: Left Pain - part of body: Knee     Time: 0800-0830 PT Time Calculation (min) (ACUTE ONLY): 30 min  Charges:  $Therapeutic Exercise: 8-22 mins $Therapeutic Activity: 8-22 mins                    G Codes:  Functional Limitation: Mobility: Walking and moving around Mobility: Walking and Moving Around Current Status (N8295): At least 60 percent but less than 80 percent impaired, limited or restricted Mobility: Walking and Moving Around Goal Status [phone]): At least 1 percent but less than 20 percent impaired, limited or restricted    [name], PT, DPT   [name] 03/27/2017, 9:31 AM

## 2017-03-27 NOTE — Progress Notes (Addendum)
Rept called to [facility]. Pt remains same as AM assessment. Pain controlled. Family visiting and made of D/C after lunch.

## 2017-03-27 NOTE — Progress Notes (Signed)
Patient is medically stable for D/C back to [facility] today. [facility] patient can come back to private room E-5. [facility]. RN will call report and arrange EMS for transport. Clinical Child psychotherapistocial Worker (CSW) [facility] via [employer]. Patient is aware of above. CSW attempted to contact patient's daughter in law [name] however she did not answer and a voicemail was left. Please reconsult if future social work needs arise. CSW signing off.   [employer]Bailey Ruford Dudzinski, LCSW [phone]

## 2017-03-27 NOTE — Progress Notes (Signed)
Pt alert and oriented. Offered pain medication throughout shift. Pt denies pain. 2 person assist to get back to bed this shift. Pt able to move bowels this shift. Dressing remaining dry and intact. Pt able to sleep in between care.

## 2017-03-27 NOTE — Progress Notes (Signed)
   Subjective: 3 Days Post-Op Procedure(s) (LRB): TOTAL KNEE REVISION (Left) Patient reports pain as mild.   Patient is well, and has had no acute complaints or problems Patient has done very well with physical therapy. Progressing on a daily basis..  Plan is to go Rehab after hospital stay. no nausea and no vomiting Patient denies any chest pains or shortness of breath. No new complaints Objective: Vital signs in last 24 hours: Temp:  [98 F (36.7 C)-98.5 F (36.9 C)] 98.5 F (36.9 C) (10/24 1940) Pulse Rate:  [53-81] 70 (10/24 1940) Resp:  [16-19] 19 (10/24 1940) BP: (114-179)/(43-65) 138/49 (10/24 1940) SpO2:  [97 %-100 %] 100 % (10/24 1940) well approximated incision Heels are non tender and elevated off the bed using rolled towels Intake/Output from previous day: 10/24 0701 - 10/25 0700 In: 840 [P.O.:840] Out: -  Intake/Output this shift: No intake/output data recorded.   Recent Labs  03/25/17 1143 03/26/17 0605  HGB 11.8* 11.9*    Recent Labs  03/25/17 1143 03/26/17 0605  WBC 9.9 6.4  RBC 4.07 4.07  HCT 35.4 35.4  PLT 317 303    Recent Labs  03/25/17 1143 03/26/17 0605  NA 133* 138  K 3.6 3.9  CL 100* 105  CO2 25 26  BUN 21* 22*  CREATININE 1.24* 1.11*  GLUCOSE 150* 114*  CALCIUM 9.1 9.0   No results for input(s): LABPT, INR in the last 72 hours.  EXAM General - Patient is Alert, Appropriate and Oriented Extremity - Neurologically intact Neurovascular intact Sensation intact distally Intact pulses distally Dorsiflexion/Plantar flexion intact No cellulitis present Compartment soft Dressing - dressing C/D/I Motor Function - intact, moving foot and toes well on exam.    Past Medical History:  Diagnosis Date  . Anxiety   . Arthritis    knees  . Borderline diabetic   . Complication of anesthesia    wakes up slowly, takes longer than usual to wake  . Depression   . Diabetes (HCC)   . GERD (gastroesophageal reflux disease)    on  occas. uses TUMS for indigestion   . Heart murmur   . History of kidney stones   . History of stress test    10 yrs. ago, states it was normal , had been ordered for pt. due to stress related to husband's illness,, by Dr. Vic RipperNancy Malone  . Hyperlipemia   . Hypertension   . Macular degeneration   . Peripheral vascular disease (HCC)    blood clots in legs mid 2000's   . Renal calculus    history of > 50 yrs. ago    Assessment/Plan: 3 Days Post-Op Procedure(s) (LRB): TOTAL KNEE REVISION (Left) Active Problems:   S/P total knee arthroplasty  Estimated body mass index is 38.39 kg/m as calculated from the following:   Height as of this encounter: 5\' 2"  (1.575 m).   Weight as of this encounter: 95.2 kg (209 lb 14.1 oz). Up with therapy Discharge to SNF  Labs: None DVT Prophylaxis - Lovenox, Foot Pumps and TED hose Weight-Bearing as tolerated to left leg Please change dressing prior to patient being discharged.  Lynnda ShieldsJon R. Eye [facility]Wolfe PA [facility] 03/27/2017, 7:34 AM

## 2017-03-29 LAB — AEROBIC/ANAEROBIC CULTURE W GRAM STAIN (SURGICAL/DEEP WOUND)
Culture: NO GROWTH
Culture: NO GROWTH

## 2017-03-29 LAB — AEROBIC/ANAEROBIC CULTURE (SURGICAL/DEEP WOUND)
CULTURE: NO GROWTH
CULTURE: NO GROWTH

## 2017-04-02 DIAGNOSIS — K219 Gastro-esophageal reflux disease without esophagitis: Secondary | ICD-10-CM | POA: Diagnosis not present

## 2017-04-02 DIAGNOSIS — Z96659 Presence of unspecified artificial knee joint: Secondary | ICD-10-CM | POA: Diagnosis not present

## 2017-04-02 DIAGNOSIS — N390 Urinary tract infection, site not specified: Secondary | ICD-10-CM | POA: Diagnosis not present

## 2017-04-02 DIAGNOSIS — M25562 Pain in left knee: Secondary | ICD-10-CM | POA: Diagnosis not present

## 2017-04-02 DIAGNOSIS — R531 Weakness: Secondary | ICD-10-CM | POA: Diagnosis not present

## 2017-04-02 DIAGNOSIS — M25561 Pain in right knee: Secondary | ICD-10-CM | POA: Diagnosis not present

## 2017-04-02 DIAGNOSIS — I129 Hypertensive chronic kidney disease with stage 1 through stage 4 chronic kidney disease, or unspecified chronic kidney disease: Secondary | ICD-10-CM | POA: Diagnosis not present

## 2017-04-02 DIAGNOSIS — Z736 Limitation of activities due to disability: Secondary | ICD-10-CM | POA: Diagnosis not present

## 2017-04-02 DIAGNOSIS — R262 Difficulty in walking, not elsewhere classified: Secondary | ICD-10-CM | POA: Diagnosis not present

## 2017-04-02 DIAGNOSIS — E1152 Type 2 diabetes mellitus with diabetic peripheral angiopathy with gangrene: Secondary | ICD-10-CM | POA: Diagnosis not present

## 2017-04-02 DIAGNOSIS — E1165 Type 2 diabetes mellitus with hyperglycemia: Secondary | ICD-10-CM | POA: Diagnosis not present

## 2017-04-02 DIAGNOSIS — M6281 Muscle weakness (generalized): Secondary | ICD-10-CM | POA: Diagnosis not present

## 2017-04-05 DIAGNOSIS — E1152 Type 2 diabetes mellitus with diabetic peripheral angiopathy with gangrene: Secondary | ICD-10-CM | POA: Diagnosis not present

## 2017-04-05 DIAGNOSIS — E1165 Type 2 diabetes mellitus with hyperglycemia: Secondary | ICD-10-CM | POA: Diagnosis not present

## 2017-04-05 DIAGNOSIS — K219 Gastro-esophageal reflux disease without esophagitis: Secondary | ICD-10-CM | POA: Diagnosis not present

## 2017-04-05 DIAGNOSIS — I129 Hypertensive chronic kidney disease with stage 1 through stage 4 chronic kidney disease, or unspecified chronic kidney disease: Secondary | ICD-10-CM | POA: Diagnosis not present

## 2017-04-09 ENCOUNTER — Ambulatory Visit: Payer: Medicare Other

## 2017-04-15 ENCOUNTER — Encounter: Payer: Medicare Other | Admitting: Physician Assistant

## 2017-04-21 DIAGNOSIS — M6281 Muscle weakness (generalized): Secondary | ICD-10-CM | POA: Diagnosis not present

## 2017-04-21 DIAGNOSIS — M25562 Pain in left knee: Secondary | ICD-10-CM | POA: Diagnosis not present

## 2017-04-21 DIAGNOSIS — R262 Difficulty in walking, not elsewhere classified: Secondary | ICD-10-CM | POA: Diagnosis not present

## 2017-04-21 DIAGNOSIS — M25561 Pain in right knee: Secondary | ICD-10-CM | POA: Diagnosis not present

## 2017-04-21 DIAGNOSIS — R531 Weakness: Secondary | ICD-10-CM | POA: Diagnosis not present

## 2017-04-21 DIAGNOSIS — Z96659 Presence of unspecified artificial knee joint: Secondary | ICD-10-CM | POA: Diagnosis not present

## 2017-04-22 DIAGNOSIS — Z96659 Presence of unspecified artificial knee joint: Secondary | ICD-10-CM | POA: Diagnosis not present

## 2017-04-22 DIAGNOSIS — M6281 Muscle weakness (generalized): Secondary | ICD-10-CM | POA: Diagnosis not present

## 2017-04-22 DIAGNOSIS — R531 Weakness: Secondary | ICD-10-CM | POA: Diagnosis not present

## 2017-04-22 DIAGNOSIS — M25562 Pain in left knee: Secondary | ICD-10-CM | POA: Diagnosis not present

## 2017-04-22 DIAGNOSIS — M25561 Pain in right knee: Secondary | ICD-10-CM | POA: Diagnosis not present

## 2017-04-22 DIAGNOSIS — R262 Difficulty in walking, not elsewhere classified: Secondary | ICD-10-CM | POA: Diagnosis not present

## 2017-04-23 DIAGNOSIS — R262 Difficulty in walking, not elsewhere classified: Secondary | ICD-10-CM | POA: Diagnosis not present

## 2017-04-23 DIAGNOSIS — M6281 Muscle weakness (generalized): Secondary | ICD-10-CM | POA: Diagnosis not present

## 2017-04-23 DIAGNOSIS — R531 Weakness: Secondary | ICD-10-CM | POA: Diagnosis not present

## 2017-04-23 DIAGNOSIS — M25561 Pain in right knee: Secondary | ICD-10-CM | POA: Diagnosis not present

## 2017-04-23 DIAGNOSIS — Z96659 Presence of unspecified artificial knee joint: Secondary | ICD-10-CM | POA: Diagnosis not present

## 2017-04-23 DIAGNOSIS — M25562 Pain in left knee: Secondary | ICD-10-CM | POA: Diagnosis not present

## 2017-04-24 DIAGNOSIS — R262 Difficulty in walking, not elsewhere classified: Secondary | ICD-10-CM | POA: Diagnosis not present

## 2017-04-24 DIAGNOSIS — M25561 Pain in right knee: Secondary | ICD-10-CM | POA: Diagnosis not present

## 2017-04-24 DIAGNOSIS — Z96659 Presence of unspecified artificial knee joint: Secondary | ICD-10-CM | POA: Diagnosis not present

## 2017-04-24 DIAGNOSIS — M25562 Pain in left knee: Secondary | ICD-10-CM | POA: Diagnosis not present

## 2017-04-24 DIAGNOSIS — R531 Weakness: Secondary | ICD-10-CM | POA: Diagnosis not present

## 2017-04-24 DIAGNOSIS — M6281 Muscle weakness (generalized): Secondary | ICD-10-CM | POA: Diagnosis not present

## 2017-04-25 DIAGNOSIS — Z96659 Presence of unspecified artificial knee joint: Secondary | ICD-10-CM | POA: Diagnosis not present

## 2017-04-28 DIAGNOSIS — Z96659 Presence of unspecified artificial knee joint: Secondary | ICD-10-CM | POA: Diagnosis not present

## 2017-04-28 DIAGNOSIS — M25562 Pain in left knee: Secondary | ICD-10-CM | POA: Diagnosis not present

## 2017-04-28 DIAGNOSIS — R262 Difficulty in walking, not elsewhere classified: Secondary | ICD-10-CM | POA: Diagnosis not present

## 2017-04-28 DIAGNOSIS — M6281 Muscle weakness (generalized): Secondary | ICD-10-CM | POA: Diagnosis not present

## 2017-04-28 DIAGNOSIS — M25561 Pain in right knee: Secondary | ICD-10-CM | POA: Diagnosis not present

## 2017-04-28 DIAGNOSIS — R531 Weakness: Secondary | ICD-10-CM | POA: Diagnosis not present

## 2017-04-29 DIAGNOSIS — R262 Difficulty in walking, not elsewhere classified: Secondary | ICD-10-CM | POA: Diagnosis not present

## 2017-04-29 DIAGNOSIS — Z96659 Presence of unspecified artificial knee joint: Secondary | ICD-10-CM | POA: Diagnosis not present

## 2017-04-29 DIAGNOSIS — M25562 Pain in left knee: Secondary | ICD-10-CM | POA: Diagnosis not present

## 2017-04-29 DIAGNOSIS — M6281 Muscle weakness (generalized): Secondary | ICD-10-CM | POA: Diagnosis not present

## 2017-04-29 DIAGNOSIS — R531 Weakness: Secondary | ICD-10-CM | POA: Diagnosis not present

## 2017-04-29 DIAGNOSIS — M25561 Pain in right knee: Secondary | ICD-10-CM | POA: Diagnosis not present

## 2017-04-30 DIAGNOSIS — M25561 Pain in right knee: Secondary | ICD-10-CM | POA: Diagnosis not present

## 2017-04-30 DIAGNOSIS — M6281 Muscle weakness (generalized): Secondary | ICD-10-CM | POA: Diagnosis not present

## 2017-04-30 DIAGNOSIS — M25562 Pain in left knee: Secondary | ICD-10-CM | POA: Diagnosis not present

## 2017-04-30 DIAGNOSIS — Z96659 Presence of unspecified artificial knee joint: Secondary | ICD-10-CM | POA: Diagnosis not present

## 2017-04-30 DIAGNOSIS — R262 Difficulty in walking, not elsewhere classified: Secondary | ICD-10-CM | POA: Diagnosis not present

## 2017-04-30 DIAGNOSIS — R531 Weakness: Secondary | ICD-10-CM | POA: Diagnosis not present

## 2017-05-01 DIAGNOSIS — M6281 Muscle weakness (generalized): Secondary | ICD-10-CM | POA: Diagnosis not present

## 2017-05-01 DIAGNOSIS — R262 Difficulty in walking, not elsewhere classified: Secondary | ICD-10-CM | POA: Diagnosis not present

## 2017-05-01 DIAGNOSIS — Z96659 Presence of unspecified artificial knee joint: Secondary | ICD-10-CM | POA: Diagnosis not present

## 2017-05-01 DIAGNOSIS — M25561 Pain in right knee: Secondary | ICD-10-CM | POA: Diagnosis not present

## 2017-05-01 DIAGNOSIS — R531 Weakness: Secondary | ICD-10-CM | POA: Diagnosis not present

## 2017-05-01 DIAGNOSIS — M25562 Pain in left knee: Secondary | ICD-10-CM | POA: Diagnosis not present

## 2017-05-02 DIAGNOSIS — Z96659 Presence of unspecified artificial knee joint: Secondary | ICD-10-CM | POA: Diagnosis not present

## 2017-05-05 DIAGNOSIS — M25561 Pain in right knee: Secondary | ICD-10-CM | POA: Diagnosis not present

## 2017-05-05 DIAGNOSIS — M25562 Pain in left knee: Secondary | ICD-10-CM | POA: Diagnosis not present

## 2017-05-05 DIAGNOSIS — Z96659 Presence of unspecified artificial knee joint: Secondary | ICD-10-CM | POA: Diagnosis not present

## 2017-05-05 DIAGNOSIS — M6281 Muscle weakness (generalized): Secondary | ICD-10-CM | POA: Diagnosis not present

## 2017-05-05 DIAGNOSIS — R262 Difficulty in walking, not elsewhere classified: Secondary | ICD-10-CM | POA: Diagnosis not present

## 2017-05-05 DIAGNOSIS — R531 Weakness: Secondary | ICD-10-CM | POA: Diagnosis not present

## 2017-05-06 DIAGNOSIS — M25561 Pain in right knee: Secondary | ICD-10-CM | POA: Diagnosis not present

## 2017-05-06 DIAGNOSIS — M25562 Pain in left knee: Secondary | ICD-10-CM | POA: Diagnosis not present

## 2017-05-06 DIAGNOSIS — R262 Difficulty in walking, not elsewhere classified: Secondary | ICD-10-CM | POA: Diagnosis not present

## 2017-05-06 DIAGNOSIS — Z96659 Presence of unspecified artificial knee joint: Secondary | ICD-10-CM | POA: Diagnosis not present

## 2017-05-06 DIAGNOSIS — M6281 Muscle weakness (generalized): Secondary | ICD-10-CM | POA: Diagnosis not present

## 2017-05-06 DIAGNOSIS — R531 Weakness: Secondary | ICD-10-CM | POA: Diagnosis not present

## 2017-05-06 DIAGNOSIS — Z96652 Presence of left artificial knee joint: Secondary | ICD-10-CM | POA: Diagnosis not present

## 2017-05-07 DIAGNOSIS — E785 Hyperlipidemia, unspecified: Secondary | ICD-10-CM | POA: Diagnosis not present

## 2017-05-07 DIAGNOSIS — I10 Essential (primary) hypertension: Secondary | ICD-10-CM | POA: Diagnosis not present

## 2017-05-07 DIAGNOSIS — M25561 Pain in right knee: Secondary | ICD-10-CM | POA: Diagnosis not present

## 2017-05-07 DIAGNOSIS — M25562 Pain in left knee: Secondary | ICD-10-CM | POA: Diagnosis not present

## 2017-05-07 DIAGNOSIS — Z96659 Presence of unspecified artificial knee joint: Secondary | ICD-10-CM | POA: Diagnosis not present

## 2017-05-07 DIAGNOSIS — R262 Difficulty in walking, not elsewhere classified: Secondary | ICD-10-CM | POA: Diagnosis not present

## 2017-05-07 DIAGNOSIS — R531 Weakness: Secondary | ICD-10-CM | POA: Diagnosis not present

## 2017-05-07 DIAGNOSIS — M6281 Muscle weakness (generalized): Secondary | ICD-10-CM | POA: Diagnosis not present

## 2017-05-08 DIAGNOSIS — M25562 Pain in left knee: Secondary | ICD-10-CM | POA: Diagnosis not present

## 2017-05-08 DIAGNOSIS — R531 Weakness: Secondary | ICD-10-CM | POA: Diagnosis not present

## 2017-05-08 DIAGNOSIS — Z96659 Presence of unspecified artificial knee joint: Secondary | ICD-10-CM | POA: Diagnosis not present

## 2017-05-08 DIAGNOSIS — M6281 Muscle weakness (generalized): Secondary | ICD-10-CM | POA: Diagnosis not present

## 2017-05-08 DIAGNOSIS — R262 Difficulty in walking, not elsewhere classified: Secondary | ICD-10-CM | POA: Diagnosis not present

## 2017-05-08 DIAGNOSIS — M25561 Pain in right knee: Secondary | ICD-10-CM | POA: Diagnosis not present

## 2017-05-13 DIAGNOSIS — R531 Weakness: Secondary | ICD-10-CM | POA: Diagnosis not present

## 2017-05-13 DIAGNOSIS — Z96659 Presence of unspecified artificial knee joint: Secondary | ICD-10-CM | POA: Diagnosis not present

## 2017-05-13 DIAGNOSIS — M25562 Pain in left knee: Secondary | ICD-10-CM | POA: Diagnosis not present

## 2017-05-13 DIAGNOSIS — R262 Difficulty in walking, not elsewhere classified: Secondary | ICD-10-CM | POA: Diagnosis not present

## 2017-05-13 DIAGNOSIS — M25561 Pain in right knee: Secondary | ICD-10-CM | POA: Diagnosis not present

## 2017-05-13 DIAGNOSIS — M6281 Muscle weakness (generalized): Secondary | ICD-10-CM | POA: Diagnosis not present

## 2017-05-15 DIAGNOSIS — M25562 Pain in left knee: Secondary | ICD-10-CM | POA: Diagnosis not present

## 2017-05-15 DIAGNOSIS — M6281 Muscle weakness (generalized): Secondary | ICD-10-CM | POA: Diagnosis not present

## 2017-05-15 DIAGNOSIS — R531 Weakness: Secondary | ICD-10-CM | POA: Diagnosis not present

## 2017-05-15 DIAGNOSIS — R262 Difficulty in walking, not elsewhere classified: Secondary | ICD-10-CM | POA: Diagnosis not present

## 2017-05-15 DIAGNOSIS — Z96659 Presence of unspecified artificial knee joint: Secondary | ICD-10-CM | POA: Diagnosis not present

## 2017-05-15 DIAGNOSIS — M25561 Pain in right knee: Secondary | ICD-10-CM | POA: Diagnosis not present

## 2017-05-16 DIAGNOSIS — R262 Difficulty in walking, not elsewhere classified: Secondary | ICD-10-CM | POA: Diagnosis not present

## 2017-05-16 DIAGNOSIS — M6281 Muscle weakness (generalized): Secondary | ICD-10-CM | POA: Diagnosis not present

## 2017-05-16 DIAGNOSIS — R531 Weakness: Secondary | ICD-10-CM | POA: Diagnosis not present

## 2017-05-16 DIAGNOSIS — Z96659 Presence of unspecified artificial knee joint: Secondary | ICD-10-CM | POA: Diagnosis not present

## 2017-05-16 DIAGNOSIS — M25561 Pain in right knee: Secondary | ICD-10-CM | POA: Diagnosis not present

## 2017-05-16 DIAGNOSIS — M25562 Pain in left knee: Secondary | ICD-10-CM | POA: Diagnosis not present

## 2017-06-09 DIAGNOSIS — I10 Essential (primary) hypertension: Secondary | ICD-10-CM | POA: Diagnosis not present

## 2017-06-09 DIAGNOSIS — Z6838 Body mass index (BMI) 38.0-38.9, adult: Secondary | ICD-10-CM | POA: Diagnosis not present

## 2017-06-09 DIAGNOSIS — M48062 Spinal stenosis, lumbar region with neurogenic claudication: Secondary | ICD-10-CM | POA: Diagnosis not present

## 2017-06-16 ENCOUNTER — Other Ambulatory Visit: Payer: Self-pay | Admitting: Neurosurgery

## 2017-06-16 DIAGNOSIS — M48062 Spinal stenosis, lumbar region with neurogenic claudication: Secondary | ICD-10-CM

## 2017-06-20 ENCOUNTER — Ambulatory Visit
Admission: RE | Admit: 2017-06-20 | Discharge: 2017-06-20 | Disposition: A | Discharge status: Home / Self Care | Payer: Medicare Other | Source: Ambulatory Visit | Attending: Neurosurgery | Admitting: Neurosurgery

## 2017-06-20 DIAGNOSIS — M48062 Spinal stenosis, lumbar region with neurogenic claudication: Secondary | ICD-10-CM | POA: Insufficient documentation

## 2017-06-20 DIAGNOSIS — M5126 Other intervertebral disc displacement, lumbar region: Secondary | ICD-10-CM | POA: Diagnosis not present

## 2017-06-20 DIAGNOSIS — Z9889 Other specified postprocedural states: Secondary | ICD-10-CM | POA: Diagnosis not present

## 2017-06-20 LAB — POCT I-STAT CREATININE: Creatinine, Ser: 1 mg/dL (ref 0.44–1.00)

## 2017-06-20 MED ORDER — GADOBENATE DIMEGLUMINE 529 MG/ML IV SOLN
20.0000 mL | Freq: Once | INTRAVENOUS | Status: AC | PRN
  Administered 2017-06-20: 19 mL via INTRAVENOUS

## 2017-06-24 DIAGNOSIS — I10 Essential (primary) hypertension: Secondary | ICD-10-CM | POA: Diagnosis not present

## 2017-06-24 DIAGNOSIS — Z6838 Body mass index (BMI) 38.0-38.9, adult: Secondary | ICD-10-CM | POA: Diagnosis not present

## 2017-06-24 DIAGNOSIS — M4310 Spondylolisthesis, site unspecified: Secondary | ICD-10-CM | POA: Diagnosis not present

## 2017-07-04 DIAGNOSIS — H353232 Exudative age-related macular degeneration, bilateral, with inactive choroidal neovascularization: Secondary | ICD-10-CM | POA: Diagnosis not present

## 2017-07-09 DIAGNOSIS — M4316 Spondylolisthesis, lumbar region: Secondary | ICD-10-CM | POA: Diagnosis not present

## 2017-07-09 DIAGNOSIS — F418 Other specified anxiety disorders: Secondary | ICD-10-CM | POA: Diagnosis not present

## 2017-07-09 DIAGNOSIS — M1990 Unspecified osteoarthritis, unspecified site: Secondary | ICD-10-CM | POA: Diagnosis not present

## 2017-07-09 DIAGNOSIS — G309 Alzheimer's disease, unspecified: Secondary | ICD-10-CM | POA: Diagnosis not present

## 2017-07-15 DIAGNOSIS — M5416 Radiculopathy, lumbar region: Secondary | ICD-10-CM | POA: Diagnosis not present

## 2017-07-15 DIAGNOSIS — M5136 Other intervertebral disc degeneration, lumbar region: Secondary | ICD-10-CM | POA: Diagnosis not present

## 2017-07-30 DIAGNOSIS — E785 Hyperlipidemia, unspecified: Secondary | ICD-10-CM | POA: Diagnosis not present

## 2017-07-30 DIAGNOSIS — Z79899 Other long term (current) drug therapy: Secondary | ICD-10-CM | POA: Diagnosis not present

## 2017-08-12 DIAGNOSIS — M4726 Other spondylosis with radiculopathy, lumbar region: Secondary | ICD-10-CM | POA: Diagnosis not present

## 2017-08-12 DIAGNOSIS — M5136 Other intervertebral disc degeneration, lumbar region: Secondary | ICD-10-CM | POA: Diagnosis not present

## 2017-08-12 DIAGNOSIS — M5416 Radiculopathy, lumbar region: Secondary | ICD-10-CM | POA: Diagnosis not present

## 2017-08-18 ENCOUNTER — Encounter: Payer: Self-pay | Admitting: Podiatry

## 2017-08-18 ENCOUNTER — Ambulatory Visit: Payer: Medicare Other | Admitting: Podiatry

## 2017-08-18 DIAGNOSIS — B351 Tinea unguium: Secondary | ICD-10-CM

## 2017-08-18 DIAGNOSIS — M79676 Pain in unspecified toe(s): Secondary | ICD-10-CM

## 2017-08-18 NOTE — Progress Notes (Signed)
She presents today chief complaint of painful elongated toenails bilaterally.  Objective: Pulses are strongly palpable.  Toenails are long thick yellow dystrophic onychomycotic no erythema edema cellulitis drainage or odor.  Assessment: Pain in limb secondary to onychomycosis 1 through 5 bilateral.  Plan: Debridement of toenails 1 through 5 bilateral cover service secondary to pain.  Follow-up with us on an as-needed basis.

## 2017-08-20 DIAGNOSIS — M4316 Spondylolisthesis, lumbar region: Secondary | ICD-10-CM | POA: Diagnosis not present

## 2017-08-20 DIAGNOSIS — G309 Alzheimer's disease, unspecified: Secondary | ICD-10-CM | POA: Diagnosis not present

## 2017-08-20 DIAGNOSIS — M1990 Unspecified osteoarthritis, unspecified site: Secondary | ICD-10-CM | POA: Diagnosis not present

## 2017-08-20 DIAGNOSIS — L03115 Cellulitis of right lower limb: Secondary | ICD-10-CM | POA: Diagnosis not present

## 2017-08-27 DIAGNOSIS — L03115 Cellulitis of right lower limb: Secondary | ICD-10-CM | POA: Diagnosis not present

## 2017-08-27 DIAGNOSIS — M4316 Spondylolisthesis, lumbar region: Secondary | ICD-10-CM | POA: Diagnosis not present

## 2017-08-27 DIAGNOSIS — M1990 Unspecified osteoarthritis, unspecified site: Secondary | ICD-10-CM | POA: Diagnosis not present

## 2017-08-27 DIAGNOSIS — G309 Alzheimer's disease, unspecified: Secondary | ICD-10-CM | POA: Diagnosis not present

## 2017-09-02 DIAGNOSIS — G8929 Other chronic pain: Secondary | ICD-10-CM | POA: Diagnosis not present

## 2017-09-02 DIAGNOSIS — E1122 Type 2 diabetes mellitus with diabetic chronic kidney disease: Secondary | ICD-10-CM | POA: Diagnosis not present

## 2017-09-02 DIAGNOSIS — E538 Deficiency of other specified B group vitamins: Secondary | ICD-10-CM | POA: Diagnosis not present

## 2017-09-02 DIAGNOSIS — B351 Tinea unguium: Secondary | ICD-10-CM | POA: Diagnosis not present

## 2017-09-02 DIAGNOSIS — M545 Low back pain: Secondary | ICD-10-CM | POA: Diagnosis not present

## 2017-09-02 DIAGNOSIS — F418 Other specified anxiety disorders: Secondary | ICD-10-CM | POA: Diagnosis not present

## 2017-09-02 DIAGNOSIS — I129 Hypertensive chronic kidney disease with stage 1 through stage 4 chronic kidney disease, or unspecified chronic kidney disease: Secondary | ICD-10-CM | POA: Diagnosis not present

## 2017-09-02 DIAGNOSIS — E1151 Type 2 diabetes mellitus with diabetic peripheral angiopathy without gangrene: Secondary | ICD-10-CM | POA: Diagnosis not present

## 2017-09-02 DIAGNOSIS — M25562 Pain in left knee: Secondary | ICD-10-CM | POA: Diagnosis not present

## 2017-09-02 DIAGNOSIS — M1990 Unspecified osteoarthritis, unspecified site: Secondary | ICD-10-CM | POA: Diagnosis not present

## 2017-09-02 DIAGNOSIS — F0280 Dementia in other diseases classified elsewhere without behavioral disturbance: Secondary | ICD-10-CM | POA: Diagnosis not present

## 2017-09-02 DIAGNOSIS — N183 Chronic kidney disease, stage 3 (moderate): Secondary | ICD-10-CM | POA: Diagnosis not present

## 2017-09-02 DIAGNOSIS — G301 Alzheimer's disease with late onset: Secondary | ICD-10-CM | POA: Diagnosis not present

## 2017-09-04 DIAGNOSIS — R600 Localized edema: Secondary | ICD-10-CM | POA: Diagnosis not present

## 2017-09-04 DIAGNOSIS — N183 Chronic kidney disease, stage 3 (moderate): Secondary | ICD-10-CM | POA: Diagnosis not present

## 2017-09-04 DIAGNOSIS — R269 Unspecified abnormalities of gait and mobility: Secondary | ICD-10-CM | POA: Diagnosis not present

## 2017-09-04 DIAGNOSIS — I890 Lymphedema, not elsewhere classified: Secondary | ICD-10-CM | POA: Diagnosis not present

## 2017-09-08 DIAGNOSIS — Z79899 Other long term (current) drug therapy: Secondary | ICD-10-CM | POA: Diagnosis not present

## 2017-09-11 DIAGNOSIS — R600 Localized edema: Secondary | ICD-10-CM | POA: Diagnosis not present

## 2017-09-11 DIAGNOSIS — R269 Unspecified abnormalities of gait and mobility: Secondary | ICD-10-CM | POA: Diagnosis not present

## 2017-09-11 DIAGNOSIS — E119 Type 2 diabetes mellitus without complications: Secondary | ICD-10-CM | POA: Diagnosis not present

## 2017-09-11 DIAGNOSIS — I10 Essential (primary) hypertension: Secondary | ICD-10-CM | POA: Diagnosis not present

## 2017-09-15 DIAGNOSIS — R600 Localized edema: Secondary | ICD-10-CM | POA: Diagnosis not present

## 2017-09-15 DIAGNOSIS — Z79899 Other long term (current) drug therapy: Secondary | ICD-10-CM | POA: Diagnosis not present

## 2017-09-15 DIAGNOSIS — R269 Unspecified abnormalities of gait and mobility: Secondary | ICD-10-CM | POA: Diagnosis not present

## 2017-09-15 DIAGNOSIS — Z96653 Presence of artificial knee joint, bilateral: Secondary | ICD-10-CM | POA: Diagnosis not present

## 2017-10-06 DIAGNOSIS — R600 Localized edema: Secondary | ICD-10-CM | POA: Diagnosis not present

## 2017-10-06 DIAGNOSIS — R269 Unspecified abnormalities of gait and mobility: Secondary | ICD-10-CM | POA: Diagnosis not present

## 2017-10-06 DIAGNOSIS — Z96653 Presence of artificial knee joint, bilateral: Secondary | ICD-10-CM | POA: Diagnosis not present

## 2017-10-06 DIAGNOSIS — Z79899 Other long term (current) drug therapy: Secondary | ICD-10-CM | POA: Diagnosis not present

## 2017-11-05 DIAGNOSIS — R3915 Urgency of urination: Secondary | ICD-10-CM | POA: Diagnosis not present

## 2017-11-05 DIAGNOSIS — R309 Painful micturition, unspecified: Secondary | ICD-10-CM | POA: Diagnosis not present

## 2017-11-06 DIAGNOSIS — E785 Hyperlipidemia, unspecified: Secondary | ICD-10-CM | POA: Diagnosis not present

## 2017-11-06 DIAGNOSIS — G894 Chronic pain syndrome: Secondary | ICD-10-CM | POA: Diagnosis not present

## 2017-11-06 DIAGNOSIS — I890 Lymphedema, not elsewhere classified: Secondary | ICD-10-CM | POA: Diagnosis not present

## 2017-11-06 DIAGNOSIS — R269 Unspecified abnormalities of gait and mobility: Secondary | ICD-10-CM | POA: Diagnosis not present

## 2017-11-08 ENCOUNTER — Emergency Department
Admission: EM | Admit: 2017-11-08 | Discharge: 2017-11-08 | Disposition: A | Discharge status: Home / Self Care | Payer: Medicare Other | Attending: Emergency Medicine | Admitting: Emergency Medicine

## 2017-11-08 ENCOUNTER — Emergency Department: Payer: Medicare Other

## 2017-11-08 ENCOUNTER — Encounter: Payer: Self-pay | Admitting: Emergency Medicine

## 2017-11-08 ENCOUNTER — Other Ambulatory Visit: Payer: Self-pay

## 2017-11-08 DIAGNOSIS — R1030 Lower abdominal pain, unspecified: Secondary | ICD-10-CM | POA: Diagnosis not present

## 2017-11-08 DIAGNOSIS — Z79899 Other long term (current) drug therapy: Secondary | ICD-10-CM | POA: Insufficient documentation

## 2017-11-08 DIAGNOSIS — Z96651 Presence of right artificial knee joint: Secondary | ICD-10-CM | POA: Diagnosis not present

## 2017-11-08 DIAGNOSIS — Z96652 Presence of left artificial knee joint: Secondary | ICD-10-CM | POA: Diagnosis not present

## 2017-11-08 DIAGNOSIS — Z7902 Long term (current) use of antithrombotics/antiplatelets: Secondary | ICD-10-CM | POA: Diagnosis not present

## 2017-11-08 DIAGNOSIS — I129 Hypertensive chronic kidney disease with stage 1 through stage 4 chronic kidney disease, or unspecified chronic kidney disease: Secondary | ICD-10-CM | POA: Diagnosis not present

## 2017-11-08 DIAGNOSIS — E1122 Type 2 diabetes mellitus with diabetic chronic kidney disease: Secondary | ICD-10-CM | POA: Insufficient documentation

## 2017-11-08 DIAGNOSIS — I447 Left bundle-branch block, unspecified: Secondary | ICD-10-CM | POA: Diagnosis not present

## 2017-11-08 DIAGNOSIS — R1032 Left lower quadrant pain: Secondary | ICD-10-CM | POA: Diagnosis not present

## 2017-11-08 DIAGNOSIS — N183 Chronic kidney disease, stage 3 (moderate): Secondary | ICD-10-CM | POA: Insufficient documentation

## 2017-11-08 DIAGNOSIS — M545 Low back pain: Secondary | ICD-10-CM | POA: Insufficient documentation

## 2017-11-08 DIAGNOSIS — R109 Unspecified abdominal pain: Secondary | ICD-10-CM | POA: Diagnosis not present

## 2017-11-08 LAB — URINALYSIS, COMPLETE (UACMP) WITH MICROSCOPIC
BACTERIA UA: NONE SEEN
BILIRUBIN URINE: NEGATIVE
Glucose, UA: NEGATIVE mg/dL
HGB URINE DIPSTICK: NEGATIVE
Ketones, ur: NEGATIVE mg/dL
Leukocytes, UA: NEGATIVE
Nitrite: NEGATIVE
PROTEIN: NEGATIVE mg/dL
Specific Gravity, Urine: 1.006 (ref 1.005–1.030)
WBC UA: NONE SEEN WBC/hpf (ref 0–5)
pH: 8 (ref 5.0–8.0)

## 2017-11-08 LAB — GASTROINTESTINAL PANEL BY PCR, STOOL (REPLACES STOOL CULTURE)
Adenovirus F40/41: NOT DETECTED
Astrovirus: NOT DETECTED
CRYPTOSPORIDIUM: NOT DETECTED
Campylobacter species: NOT DETECTED
Cyclospora cayetanensis: NOT DETECTED
ENTEROAGGREGATIVE E COLI (EAEC): NOT DETECTED
Entamoeba histolytica: NOT DETECTED
Enteropathogenic E coli (EPEC): NOT DETECTED
Enterotoxigenic E coli (ETEC): NOT DETECTED
GIARDIA LAMBLIA: NOT DETECTED
Norovirus GI/GII: NOT DETECTED
PLESIMONAS SHIGELLOIDES: NOT DETECTED
ROTAVIRUS A: NOT DETECTED
SALMONELLA SPECIES: NOT DETECTED
SAPOVIRUS (I, II, IV, AND V): NOT DETECTED
SHIGELLA/ENTEROINVASIVE E COLI (EIEC): NOT DETECTED
Shiga like toxin producing E coli (STEC): NOT DETECTED
Vibrio cholerae: NOT DETECTED
Vibrio species: NOT DETECTED
Yersinia enterocolitica: NOT DETECTED

## 2017-11-08 LAB — C DIFFICILE QUICK SCREEN W PCR REFLEX
C DIFFICILE (CDIFF) INTERP: NOT DETECTED
C DIFFICILE (CDIFF) TOXIN: NEGATIVE
C Diff antigen: NEGATIVE

## 2017-11-08 LAB — COMPREHENSIVE METABOLIC PANEL
ALBUMIN: 4 g/dL (ref 3.5–5.0)
ALT: 24 U/L (ref 14–54)
ANION GAP: 9 (ref 5–15)
AST: 27 U/L (ref 15–41)
Alkaline Phosphatase: 68 U/L (ref 38–126)
BUN: 26 mg/dL — ABNORMAL HIGH (ref 6–20)
CHLORIDE: 99 mmol/L — AB (ref 101–111)
CO2: 28 mmol/L (ref 22–32)
Calcium: 9.3 mg/dL (ref 8.9–10.3)
Creatinine, Ser: 1.03 mg/dL — ABNORMAL HIGH (ref 0.44–1.00)
GFR calc non Af Amer: 47 mL/min — ABNORMAL LOW (ref >60)
GFR, EST AFRICAN AMERICAN: 55 mL/min — AB (ref >60)
GLUCOSE: 116 mg/dL — AB (ref 65–99)
POTASSIUM: 4 mmol/L (ref 3.5–5.1)
SODIUM: 136 mmol/L (ref 135–145)
Total Bilirubin: 0.5 mg/dL (ref 0.3–1.2)
Total Protein: 7.4 g/dL (ref 6.5–8.1)

## 2017-11-08 LAB — CBC
HEMATOCRIT: 37.7 % (ref 35.0–47.0)
HEMOGLOBIN: 13 g/dL (ref 12.0–16.0)
MCH: 30.4 pg (ref 26.0–34.0)
MCHC: 34.4 g/dL (ref 32.0–36.0)
MCV: 88.3 fL (ref 80.0–100.0)
Platelets: 295 10*3/uL (ref 150–440)
RBC: 4.27 MIL/uL (ref 3.80–5.20)
RDW: 14.8 % — ABNORMAL HIGH (ref 11.5–14.5)
WBC: 6.3 10*3/uL (ref 3.6–11.0)

## 2017-11-08 LAB — LIPASE, BLOOD: LIPASE: 57 U/L — AB (ref 11–51)

## 2017-11-08 LAB — TROPONIN I

## 2017-11-08 MED ORDER — ACETAMINOPHEN 325 MG PO TABS
650.0000 mg | ORAL_TABLET | Freq: Once | ORAL | Status: AC
  Administered 2017-11-08: 650 mg via ORAL
  Filled 2017-11-08: qty 2

## 2017-11-08 MED ORDER — IOHEXOL 300 MG/ML  SOLN
100.0000 mL | Freq: Once | INTRAMUSCULAR | Status: AC | PRN
  Administered 2017-11-08: 100 mL via INTRAVENOUS

## 2017-11-08 MED ORDER — IOPAMIDOL (ISOVUE-300) INJECTION 61%
30.0000 mL | Freq: Once | INTRAVENOUS | Status: AC | PRN
  Administered 2017-11-08: 30 mL via ORAL

## 2017-11-08 NOTE — ED Provider Notes (Signed)
[facility] Emergency Department Provider Note ____________________________________________   First MD Initiated Contact with Patient 11/08/17 1155     (approximate)  I have reviewed the triage vital signs and the nursing notes.   HISTORY  Chief Complaint Abdominal Pain  HPI Sherry Brooks is a 84 y.o. female has a history of diverticulosis, kidney stones, diabetes, and on discharge from her nursing facility late onset dementia  The patient presents today reports that yesterday she experiencing pain that felt like a fullness is that her stomach was full after eating.  Is persisted throughout and now she has had a increasing pain and some moderate feels like a full sensation to slightly sharp feeling in the left lower abdomen and pelvis.  She does have some pain in her lower back as well, but reports she has chronic back pain and feels like she has had ongoing pain in the left lower abdomen.  She has had normal bowel movements.  No nausea no vomiting.  She is been taking Tylenol this morning and yesterday for discomfort and reports she refused any stronger pain medicine and she does not wish to take anything stronger.  No chest pain.  No trouble breathing.  No fevers or chills.  No leg or hip pain.  No weakness numbness or problems in the feet  Past Medical History:  Diagnosis Date  . Anxiety   . Arthritis    knees  . Borderline diabetic   . Complication of anesthesia    wakes up slowly, takes longer than usual to wake  . Depression   . Diabetes (HCC)   . GERD (gastroesophageal reflux disease)    on occas. uses TUMS for indigestion   . Heart murmur   . History of kidney stones   . History of stress test    10 yrs. ago, states it was normal , had been ordered for pt. due to stress related to husband's illness,, by Dr. Vic RipperNancy Malone  . Hyperlipemia   . Hypertension   . Macular degeneration   . Peripheral vascular disease (HCC)    blood clots in  legs mid 2000's   . Renal calculus    history of > 50 yrs. ago    Patient Active Problem List   Diagnosis Date Noted  . S/P total knee arthroplasty 03/24/2017  . UTI (urinary tract infection) 03/09/2017  . H/O diverticulitis of colon 07/29/2016  . Broken arm 07/29/2016  . Chronic venous insufficiency 04/22/2016  . Pain in limb 04/22/2016  . Abnormal finding on mammography 03/28/2015  . Adult BMI 30+ 03/28/2015  . Chronic kidney disease (CKD), stage III (moderate) (HCC) 03/28/2015  . Chronic LBP 03/28/2015  . Can't get food down 03/28/2015  . Lymphedema 03/28/2015  . Degeneration macular 03/28/2015  . Amnesia 03/28/2015  . Subclinical hypothyroidism 03/28/2015  . Hypercholesterolemia 03/28/2015  . Pain in the chest 02/14/2015  . SOB (shortness of breath) 02/14/2015  . Morbid obesity (HCC) 02/14/2015  . Unsteady gait 02/14/2015  . Primary osteoarthritis of both knees 02/14/2015  . Essential hypertension 02/14/2015  . Anxiety and depression 02/14/2015  . Joint pain 01/04/2014  . Bursitis, hip 10/19/2013  . Spondylolisthesis 10/28/2012  . Arthritis, degenerative 08/25/2009  . Type 2 diabetes mellitus (HCC) 07/03/2007  . Colon, diverticulosis 02/12/2001  . OP (osteoporosis) 02/12/2001    Past Surgical History:  Procedure Laterality Date  . APPENDECTOMY     as a teenager   . BIOPSY THYROID  06/2008  .  COLON SURGERY     due to diverticulitis- had colostomy, then takedown   . EYE SURGERY     cataracts removed - /w IOL  . FRACTURE SURGERY     L wrist, plate in place  . JOINT REPLACEMENT     both knees   . SPINE SURGERY  10/23/2012   Spinal Fusion, lower back  . TOTAL KNEE REVISION Left 03/24/2017   Procedure: TOTAL KNEE REVISION;  Surgeon: Donato Heinz, MD;  Location: [facility] ORS;  Service: Orthopedics;  Laterality: Left;    Prior to Admission medications   Medication Sig Start Date End Date Taking? Authorizing Provider  acetaminophen (TYLENOL) 650 MG CR tablet Take  by mouth.    [provider]  ARTIFICIAL TEAR SOLUTION OP Apply 1 drop to eye daily.     [provider]  B Complex Vitamins (VITAMIN B COMPLEX PO) Take 1 tablet by mouth daily.     [provider]  donepezil (ARICEPT) 5 MG tablet Take by mouth. 11/15/16   [provider]  enoxaparin (LOVENOX) 40 MG/0.4ML injection Inject 0.4 mLs (40 mg total) into the skin daily. 03/25/17 04/08/17  Tera Partridge, PA  lisinopril-hydrochlorothiazide (PRINZIDE,ZESTORETIC) 20-25 MG tablet Take by mouth. 02/01/13   [provider]  Multiple Minerals-Vitamins (CALCIUM-MAGNESIUM-ZINC-D3 PO) Take 1 tablet by mouth daily.    [provider]  Multiple Vitamins-Minerals (PRESERVISION AREDS) CAPS Take 2 capsules by mouth daily.     [provider]  Olopatadine HCl (PATADAY) 0.2 % SOLN Apply to eye.    [provider]  oxyCODONE (OXY IR/ROXICODONE) 5 MG immediate release tablet Take by mouth. 03/25/17   [provider]  pravastatin (PRAVACHOL) 10 MG tablet TAKE 1 TABLET BY MOUTH EVERY NIGHT AT BEDTIME 12/24/16   [provider]  Psyllium (METAMUCIL PO) Take 6-7 capsules by mouth daily.     [provider]  sertraline (ZOLOFT) 50 MG tablet Take by mouth. 11/15/16   [provider]  traMADol (ULTRAM) 50 MG tablet Take 1-2 tablets (50-100 mg total) by mouth every 4 (four) hours as needed (mild pain). 03/27/17   Tera Partridge, PA  traMADol (ULTRAM) 50 MG tablet Take by mouth. 03/25/17   [provider]  vitamin E 400 UNIT capsule Take 400 Units by mouth daily.     [provider]    Allergies Patient has no known allergies.  Family History  Problem Relation Age of Onset  . Arthritis Mother   . Arthritis Father   . Bladder Cancer Neg Hx   . Kidney cancer Neg Hx   . Prostate cancer Neg Hx     Social History Social History   Tobacco Use  . Smoking status: Never Smoker  . Smokeless tobacco: Never Used    Substance Use Topics  . Alcohol use: No    Comment: glass of wine, rare occas.   . Drug use: No    Review of Systems Constitutional: No fever/chills Eyes: No visual changes. ENT: No sore throat. Cardiovascular: Denies chest pain. Respiratory: Denies shortness of breath. Gastrointestinal: See HPI. No diarrhea.  No constipation. Genitourinary: Negative for dysuria. Musculoskeletal: Negative for back pain. Skin: Negative for rash. Neurological: Negative for headaches, focal weakness or numbness.    ____________________________________________   PHYSICAL EXAM:  VITAL SIGNS: ED Triage Vitals  Enc Vitals Group     BP 11/08/17 1130 (!) 168/70     Pulse Rate 11/08/17 1130 69     Resp 11/08/17  1130 18     Temp 11/08/17 1130 98 F (36.7 C)     Temp Source 11/08/17 1130 Oral     SpO2 11/08/17 1130 99 %     Weight 11/08/17 1126 210 lb (95.3 kg)     Height 11/08/17 1126 5\' 4"  (1.626 m)     Head Circumference --      Peak Flow --      Pain Score 11/08/17 1126 5     Pain Loc --      Pain Edu? --      Excl. in GC? --     Constitutional: Alert and oriented. Well appearing and in no acute distress. Eyes: Conjunctivae are normal. Head: Atraumatic. Nose: No congestion/rhinnorhea. Mouth/Throat: Mucous membranes are moist. Neck: No stridor.   Cardiovascular: Normal rate, regular rhythm. Grossly normal heart sounds.  Good peripheral circulation. Respiratory: Normal respiratory effort.  No retractions. Lungs CTAB. Gastrointestinal: Soft and nontender except for mild tenderness in the LLQ without rebound. No distention. No CVA tenderness bilateral.  Musculoskeletal: No lower extremity tenderness nor edema. Neurologic:  Normal speech and language. No gross focal neurologic deficits are appreciated.  Skin:  Skin is warm, dry and intact. No rash noted. Psychiatric: Mood and affect are normal. Speech and behavior are normal.  ____________________________________________   LABS (all  labs ordered are listed, but only abnormal results are displayed)  Labs Reviewed  LIPASE, BLOOD - Abnormal; Notable for the following components:      Result Value   Lipase 57 (*)    All other components within normal limits  COMPREHENSIVE METABOLIC PANEL - Abnormal; Notable for the following components:   Chloride 99 (*)    Glucose, Bld 116 (*)    BUN 26 (*)    Creatinine, Ser 1.03 (*)    GFR calc non Af Amer 47 (*)    GFR calc Af Amer 55 (*)    All other components within normal limits  CBC - Abnormal; Notable for the following components:   RDW 14.8 (*)    All other components within normal limits  URINALYSIS, COMPLETE (UACMP) WITH MICROSCOPIC - Abnormal; Notable for the following components:   Color, Urine YELLOW (*)    APPearance HAZY (*)    All other components within normal limits  GASTROINTESTINAL PANEL BY PCR, STOOL (REPLACES STOOL CULTURE)  C DIFFICILE QUICK SCREEN W PCR REFLEX  URINE CULTURE  TROPONIN I   ____________________________________________  EKG  Reviewed by me at 1130 Heart rate 79 QRS 140 QTC 500 Normal sinus rhythm, left bundle branch block without evidence of ischemia. ____________________________________________  RADIOLOGY    CT scan results reviewed by me, biliary ductal dilatation.  Please see full report.  Discussed and reviewed with Dr. Norma Fredrickson gastroenterology ____________________________________________   PROCEDURES  Procedure(s) performed: None  Procedures  Critical Care performed: No  ____________________________________________   INITIAL IMPRESSION / ASSESSMENT AND PLAN / ED COURSE  Pertinent labs & imaging results that were available during my care of the patient were reviewed by me and considered in my medical decision making (see chart for details).  Differential diagnosis includes but is not limited to, abdominal perforation, aortic dissection, cholecystitis, appendicitis, diverticulitis, colitis,  esophagitis/gastritis, kidney stone, pyelonephritis, urinary tract infection, aortic aneurysm. All are considered in decision and treatment plan. Based upon the patient's presentation and risk factors, primarily denotes left-sided abdominal pain.  No systemic symptoms.  No urinary symptoms.  No cardiac or pulmonary symptoms.  Reassuring clinical examination but does have  focal left sided tenderness.   Clinical Course as of Nov 09 1822  Sat Nov 08, 2017  1413 Patient had 2 small but loose, light brown bowel movements.  Will collect and send for culture and C. difficile.   [MQ]  1736 Case discussed with Dr. Norma Fredrickson (GI) advises no acute concerns on review of her labs (Lfts), lipase, and would recommend outpatient follow-up. No indication for admsision or obstructive concern at the present time. Consider outpatient follow-up with GI and PCP. Recommends initially just follow-up with PCP and GI later if concerns continue. Consider treatment with laxatives for possible constipation.    [MQ]    Clinical Course User Index [MQ] Sharyn Creamer, MD   ----------------------------------------- 6:24 PM on 11/08/2017 -----------------------------------------  Patient reports she had a bowel movement and passed gas and her pain is completely resolved now.  Her CT scan, plans for follow-up with primary care doctor. Return precautions and treatment recommendations and follow-up discussed with the patient who is agreeable with the plan.   ____________________________________________   FINAL CLINICAL IMPRESSION(S) / ED DIAGNOSES  Final diagnoses:  Left lateral abdominal pain      NEW MEDICATIONS STARTED DURING THIS VISIT:  New Prescriptions   No medications on file     Note:  This document was prepared using Dragon voice recognition software and may include unintentional dictation errors.     Sharyn Creamer, MD 11/08/17 [phone]

## 2017-11-08 NOTE — ED Triage Notes (Signed)
[facility] to ED [facility] c/o lower abd pain that started yesterday radiating to her back, upon palpation pt states pain more to epigastric area.  Denies n/v/d, denies urinary symptoms.  Last normal BM today.

## 2017-11-08 NOTE — Discharge Instructions (Addendum)
? ?  Please return to the emergency room right away if you are to develop a fever, severe nausea, your pain becomes severe or worsens, you are unable to keep food down, begin vomiting any dark or bloody fluid, you develop any dark or bloody stools, feel dehydrated, or other new concerns or symptoms arise. ? ?

## 2017-11-08 NOTE — ED Notes (Signed)
Visitor stepped out to nurses desk stating that pt has a ride coming so that she can get back home.

## 2017-11-09 LAB — URINE CULTURE
CULTURE: NO GROWTH
Special Requests: NORMAL

## 2017-11-10 DIAGNOSIS — R109 Unspecified abdominal pain: Secondary | ICD-10-CM | POA: Diagnosis not present

## 2017-11-10 DIAGNOSIS — F330 Major depressive disorder, recurrent, mild: Secondary | ICD-10-CM | POA: Diagnosis not present

## 2017-11-10 DIAGNOSIS — I10 Essential (primary) hypertension: Secondary | ICD-10-CM | POA: Diagnosis not present

## 2017-11-10 DIAGNOSIS — F0390 Unspecified dementia without behavioral disturbance: Secondary | ICD-10-CM | POA: Diagnosis not present

## 2017-11-11 DIAGNOSIS — M5136 Other intervertebral disc degeneration, lumbar region: Secondary | ICD-10-CM | POA: Diagnosis not present

## 2017-11-11 DIAGNOSIS — I129 Hypertensive chronic kidney disease with stage 1 through stage 4 chronic kidney disease, or unspecified chronic kidney disease: Secondary | ICD-10-CM | POA: Diagnosis not present

## 2017-11-11 DIAGNOSIS — E538 Deficiency of other specified B group vitamins: Secondary | ICD-10-CM | POA: Diagnosis not present

## 2017-11-11 DIAGNOSIS — E1142 Type 2 diabetes mellitus with diabetic polyneuropathy: Secondary | ICD-10-CM | POA: Diagnosis not present

## 2017-11-11 DIAGNOSIS — M1991 Primary osteoarthritis, unspecified site: Secondary | ICD-10-CM | POA: Diagnosis not present

## 2017-11-11 DIAGNOSIS — F0280 Dementia in other diseases classified elsewhere without behavioral disturbance: Secondary | ICD-10-CM | POA: Diagnosis not present

## 2017-11-17 DIAGNOSIS — F0390 Unspecified dementia without behavioral disturbance: Secondary | ICD-10-CM | POA: Diagnosis not present

## 2017-11-17 DIAGNOSIS — N3281 Overactive bladder: Secondary | ICD-10-CM | POA: Diagnosis not present

## 2017-11-17 DIAGNOSIS — F419 Anxiety disorder, unspecified: Secondary | ICD-10-CM | POA: Diagnosis not present

## 2017-11-20 DIAGNOSIS — L304 Erythema intertrigo: Secondary | ICD-10-CM | POA: Diagnosis not present

## 2017-11-20 DIAGNOSIS — R600 Localized edema: Secondary | ICD-10-CM | POA: Diagnosis not present

## 2017-11-20 DIAGNOSIS — W19XXXA Unspecified fall, initial encounter: Secondary | ICD-10-CM | POA: Diagnosis not present

## 2017-11-20 DIAGNOSIS — N3281 Overactive bladder: Secondary | ICD-10-CM | POA: Diagnosis not present

## 2017-11-25 DIAGNOSIS — I739 Peripheral vascular disease, unspecified: Secondary | ICD-10-CM | POA: Diagnosis not present

## 2017-11-25 DIAGNOSIS — B351 Tinea unguium: Secondary | ICD-10-CM | POA: Diagnosis not present

## 2017-11-26 DIAGNOSIS — M545 Low back pain: Secondary | ICD-10-CM | POA: Diagnosis not present

## 2017-11-26 DIAGNOSIS — J811 Chronic pulmonary edema: Secondary | ICD-10-CM | POA: Diagnosis not present

## 2017-11-26 DIAGNOSIS — R269 Unspecified abnormalities of gait and mobility: Secondary | ICD-10-CM | POA: Diagnosis not present

## 2017-12-05 DIAGNOSIS — F0390 Unspecified dementia without behavioral disturbance: Secondary | ICD-10-CM | POA: Diagnosis not present

## 2017-12-05 DIAGNOSIS — F419 Anxiety disorder, unspecified: Secondary | ICD-10-CM | POA: Diagnosis not present

## 2017-12-05 DIAGNOSIS — F339 Major depressive disorder, recurrent, unspecified: Secondary | ICD-10-CM | POA: Diagnosis not present

## 2017-12-15 DIAGNOSIS — G894 Chronic pain syndrome: Secondary | ICD-10-CM | POA: Diagnosis not present

## 2017-12-15 DIAGNOSIS — F419 Anxiety disorder, unspecified: Secondary | ICD-10-CM | POA: Diagnosis not present

## 2017-12-15 DIAGNOSIS — I890 Lymphedema, not elsewhere classified: Secondary | ICD-10-CM | POA: Diagnosis not present

## 2017-12-16 DIAGNOSIS — L570 Actinic keratosis: Secondary | ICD-10-CM | POA: Diagnosis not present

## 2017-12-16 DIAGNOSIS — Z85828 Personal history of other malignant neoplasm of skin: Secondary | ICD-10-CM | POA: Diagnosis not present

## 2017-12-16 DIAGNOSIS — X32XXXA Exposure to sunlight, initial encounter: Secondary | ICD-10-CM | POA: Diagnosis not present

## 2017-12-16 DIAGNOSIS — Z08 Encounter for follow-up examination after completed treatment for malignant neoplasm: Secondary | ICD-10-CM | POA: Diagnosis not present

## 2017-12-19 DIAGNOSIS — F339 Major depressive disorder, recurrent, unspecified: Secondary | ICD-10-CM | POA: Diagnosis not present

## 2017-12-19 DIAGNOSIS — F0390 Unspecified dementia without behavioral disturbance: Secondary | ICD-10-CM | POA: Diagnosis not present

## 2017-12-26 DIAGNOSIS — M545 Low back pain: Secondary | ICD-10-CM | POA: Diagnosis not present

## 2017-12-26 DIAGNOSIS — F0390 Unspecified dementia without behavioral disturbance: Secondary | ICD-10-CM | POA: Diagnosis not present

## 2017-12-26 DIAGNOSIS — F339 Major depressive disorder, recurrent, unspecified: Secondary | ICD-10-CM | POA: Diagnosis not present

## 2017-12-26 DIAGNOSIS — J811 Chronic pulmonary edema: Secondary | ICD-10-CM | POA: Diagnosis not present

## 2017-12-26 DIAGNOSIS — R269 Unspecified abnormalities of gait and mobility: Secondary | ICD-10-CM | POA: Diagnosis not present

## 2018-01-06 DIAGNOSIS — H353131 Nonexudative age-related macular degeneration, bilateral, early dry stage: Secondary | ICD-10-CM | POA: Diagnosis not present

## 2018-01-12 DIAGNOSIS — G894 Chronic pain syndrome: Secondary | ICD-10-CM | POA: Diagnosis not present

## 2018-01-12 DIAGNOSIS — I890 Lymphedema, not elsewhere classified: Secondary | ICD-10-CM | POA: Diagnosis not present

## 2018-01-12 DIAGNOSIS — F419 Anxiety disorder, unspecified: Secondary | ICD-10-CM | POA: Diagnosis not present

## 2018-01-16 DIAGNOSIS — F0390 Unspecified dementia without behavioral disturbance: Secondary | ICD-10-CM | POA: Diagnosis not present

## 2018-01-16 DIAGNOSIS — F339 Major depressive disorder, recurrent, unspecified: Secondary | ICD-10-CM | POA: Diagnosis not present

## 2018-01-22 ENCOUNTER — Emergency Department: Payer: Medicare Other

## 2018-01-22 ENCOUNTER — Emergency Department
Admission: EM | Admit: 2018-01-22 | Discharge: 2018-01-22 | Disposition: A | Discharge status: Home / Self Care | Payer: Medicare Other | Attending: Emergency Medicine | Admitting: Emergency Medicine

## 2018-01-22 ENCOUNTER — Other Ambulatory Visit: Payer: Self-pay

## 2018-01-22 DIAGNOSIS — F419 Anxiety disorder, unspecified: Secondary | ICD-10-CM | POA: Diagnosis not present

## 2018-01-22 DIAGNOSIS — M5441 Lumbago with sciatica, right side: Secondary | ICD-10-CM | POA: Diagnosis not present

## 2018-01-22 DIAGNOSIS — E1122 Type 2 diabetes mellitus with diabetic chronic kidney disease: Secondary | ICD-10-CM | POA: Insufficient documentation

## 2018-01-22 DIAGNOSIS — R531 Weakness: Secondary | ICD-10-CM

## 2018-01-22 DIAGNOSIS — N183 Chronic kidney disease, stage 3 (moderate): Secondary | ICD-10-CM | POA: Insufficient documentation

## 2018-01-22 DIAGNOSIS — G8929 Other chronic pain: Secondary | ICD-10-CM

## 2018-01-22 DIAGNOSIS — F329 Major depressive disorder, single episode, unspecified: Secondary | ICD-10-CM | POA: Diagnosis not present

## 2018-01-22 DIAGNOSIS — M5442 Lumbago with sciatica, left side: Secondary | ICD-10-CM

## 2018-01-22 DIAGNOSIS — Z96653 Presence of artificial knee joint, bilateral: Secondary | ICD-10-CM | POA: Insufficient documentation

## 2018-01-22 DIAGNOSIS — M545 Low back pain: Secondary | ICD-10-CM | POA: Diagnosis not present

## 2018-01-22 DIAGNOSIS — I129 Hypertensive chronic kidney disease with stage 1 through stage 4 chronic kidney disease, or unspecified chronic kidney disease: Secondary | ICD-10-CM | POA: Insufficient documentation

## 2018-01-22 LAB — COMPREHENSIVE METABOLIC PANEL
ALBUMIN: 3.8 g/dL (ref 3.5–5.0)
ALK PHOS: 62 U/L (ref 38–126)
ALT: 16 U/L (ref 0–44)
AST: 24 U/L (ref 15–41)
Anion gap: 7 (ref 5–15)
BILIRUBIN TOTAL: 0.6 mg/dL (ref 0.3–1.2)
BUN: 23 mg/dL (ref 8–23)
CO2: 29 mmol/L (ref 22–32)
CREATININE: 0.89 mg/dL (ref 0.44–1.00)
Calcium: 9.5 mg/dL (ref 8.9–10.3)
Chloride: 100 mmol/L (ref 98–111)
GFR calc Af Amer: >60 mL/min (ref >60)
GFR calc non Af Amer: 57 mL/min — ABNORMAL LOW (ref >60)
GLUCOSE: 113 mg/dL — AB (ref 70–99)
Potassium: 4.4 mmol/L (ref 3.5–5.1)
Sodium: 136 mmol/L (ref 135–145)
TOTAL PROTEIN: 6.8 g/dL (ref 6.5–8.1)

## 2018-01-22 LAB — CBC WITH DIFFERENTIAL/PLATELET
BASOS ABS: 0 10*3/uL (ref 0–0.1)
BASOS PCT: 0 %
Eosinophils Absolute: 0 10*3/uL (ref 0–0.7)
Eosinophils Relative: 1 %
HEMATOCRIT: 38.5 % (ref 35.0–47.0)
HEMOGLOBIN: 13.2 g/dL (ref 12.0–16.0)
Lymphocytes Relative: 28 %
Lymphs Abs: 1.5 10*3/uL (ref 1.0–3.6)
MCH: 30.4 pg (ref 26.0–34.0)
MCHC: 34.4 g/dL (ref 32.0–36.0)
MCV: 88.5 fL (ref 80.0–100.0)
MONOS PCT: 9 %
Monocytes Absolute: 0.5 10*3/uL (ref 0.2–0.9)
NEUTROS ABS: 3.3 10*3/uL (ref 1.4–6.5)
NEUTROS PCT: 62 %
Platelets: 278 10*3/uL (ref 150–440)
RBC: 4.35 MIL/uL (ref 3.80–5.20)
RDW: 14.1 % (ref 11.5–14.5)
WBC: 5.3 10*3/uL (ref 3.6–11.0)

## 2018-01-22 LAB — GLUCOSE, CAPILLARY: Glucose-Capillary: 87 mg/dL (ref 70–99)

## 2018-01-22 LAB — TROPONIN I: Troponin I: <0.03 ng/mL (ref <0.03)

## 2018-01-22 MED ORDER — OXYCODONE-ACETAMINOPHEN 5-325 MG PO TABS: 1.0000 | ORAL_TABLET | Freq: Two times a day (BID) | ORAL | 0 refills | Status: DC

## 2018-01-22 MED ORDER — PREDNISONE 10 MG PO TABS: 40.0000 mg | ORAL_TABLET | Freq: Every day | ORAL | 0 refills | Status: AC

## 2018-01-22 MED ORDER — PREDNISONE 20 MG PO TABS
40.0000 mg | ORAL_TABLET | Freq: Once | ORAL | Status: AC
  Administered 2018-01-22: 40 mg via ORAL
  Filled 2018-01-22: qty 2

## 2018-01-22 MED ORDER — MORPHINE SULFATE (PF) 4 MG/ML IV SOLN
4.0000 mg | Freq: Once | INTRAVENOUS | Status: DC
  Filled 2018-01-22: qty 1

## 2018-01-22 NOTE — ED Notes (Signed)
This nurse [facility] and ultimately was hung up on. Family states they will attempt to drive pt back there themselves. Unable to speak with anyone at the facility.

## 2018-01-22 NOTE — ED Provider Notes (Signed)
[facility] Emergency Department Provider Note       Time seen: ----------------------------------------- 10:21 AM on 01/22/2018 -----------------------------------------   I have reviewed the triage vital signs and the nursing notes.  HISTORY   Chief Complaint Back Pain    HPI Sherry Brooks is a 84 y.o. female with a history of anxiety, arthritis, diabetes, depression, GERD, hyperlipidemia, hypertension who presents to the ED for low back pain difficulty walking.  Patient states the pain is so severe that she can no longer walk in the staff at [facility] do not want to take care of her.  Patient describes a chronic history of back pain but it is steadily worsening.  She denies fevers, chills or other complaints.  Past Medical History:  Diagnosis Date  . Anxiety   . Arthritis    knees  . Borderline diabetic   . Complication of anesthesia    wakes up slowly, takes longer than usual to wake  . Depression   . Diabetes (HCC)   . GERD (gastroesophageal reflux disease)    on occas. uses TUMS for indigestion   . Heart murmur   . History of kidney stones   . History of stress test    10 yrs. ago, states it was normal , had been ordered for pt. due to stress related to husband's illness,, by Dr. Vic Ripper  . Hyperlipemia   . Hypertension   . Macular degeneration   . Peripheral vascular disease (HCC)    blood clots in legs mid 2000's   . Renal calculus    history of > 50 yrs. ago    Patient Active Problem List   Diagnosis Date Noted  . S/P total knee arthroplasty 03/24/2017  . UTI (urinary tract infection) 03/09/2017  . H/O diverticulitis of colon 07/29/2016  . Broken arm 07/29/2016  . Chronic venous insufficiency 04/22/2016  . Pain in limb 04/22/2016  . Abnormal finding on mammography 03/28/2015  . Adult BMI 30+ 03/28/2015  . Chronic kidney disease (CKD), stage III (moderate) (HCC) 03/28/2015  . Chronic LBP 03/28/2015  . Can't get  food down 03/28/2015  . Lymphedema 03/28/2015  . Degeneration macular 03/28/2015  . Amnesia 03/28/2015  . Subclinical hypothyroidism 03/28/2015  . Hypercholesterolemia 03/28/2015  . Pain in the chest 02/14/2015  . SOB (shortness of breath) 02/14/2015  . Morbid obesity (HCC) 02/14/2015  . Unsteady gait 02/14/2015  . Primary osteoarthritis of both knees 02/14/2015  . Essential hypertension 02/14/2015  . Anxiety and depression 02/14/2015  . Joint pain 01/04/2014  . Bursitis, hip 10/19/2013  . Spondylolisthesis 10/28/2012  . Arthritis, degenerative 08/25/2009  . Type 2 diabetes mellitus (HCC) 07/03/2007  . Colon, diverticulosis 02/12/2001  . OP (osteoporosis) 02/12/2001    Past Surgical History:  Procedure Laterality Date  . APPENDECTOMY     as a teenager   . BIOPSY THYROID  06/2008  . COLON SURGERY     due to diverticulitis- had colostomy, then takedown   . EYE SURGERY     cataracts removed - /w IOL  . FRACTURE SURGERY     L wrist, plate in place  . JOINT REPLACEMENT     both knees   . SPINE SURGERY  10/23/2012   Spinal Fusion, lower back  . TOTAL KNEE REVISION Left 03/24/2017   Procedure: TOTAL KNEE REVISION;  Surgeon: Donato Heinz, MD;  Location: [facility] ORS;  Service: Orthopedics;  Laterality: Left;    Allergies Patient has no known allergies.  Social History Social History   Tobacco Use  . Smoking status: Never Smoker  . Smokeless tobacco: Never Used  Substance Use Topics  . Alcohol use: No    Comment: glass of wine, rare occas.   . Drug use: No   Review of Systems Constitutional: Negative for fever. Cardiovascular: Negative for chest pain. Respiratory: Negative for shortness of breath. Gastrointestinal: Negative for abdominal pain, vomiting and diarrhea. Genitourinary: Negative for dysuria. Musculoskeletal: Positive for back pain Skin: Negative for rash. Neurological: Positive for weakness  All systems negative/normal/unremarkable except as stated in  the HPI  ____________________________________________   PHYSICAL EXAM:  VITAL SIGNS: ED Triage Vitals  Enc Vitals Group     BP      Pulse      Resp      Temp      Temp src      SpO2      Weight      Height      Head Circumference      Peak Flow      Pain Score      Pain Loc      Pain Edu?      Excl. in GC?    Constitutional: Alert and oriented. Well appearing and in no distress. Eyes: Conjunctivae are normal. Normal extraocular movements. Cardiovascular: Normal rate, regular rhythm. No murmurs, rubs, or gallops. Respiratory: Normal respiratory effort without tachypnea nor retractions. Breath sounds are clear and equal bilaterally. No wheezes/rales/rhonchi. Gastrointestinal: Soft and nontender. Normal bowel sounds Musculoskeletal: Nontender with normal range of motion in extremities. No lower extremity tenderness nor edema.  Mild tenderness in the lumbosacral spine Neurologic:  Normal speech and language. No gross focal neurologic deficits are appreciated.  Skin:  Skin is warm, dry and intact. No rash noted. Psychiatric: Depressed mood and affect ___________________________________________  ED COURSE:  As part of my medical decision making, I reviewed the following data within the electronic MEDICAL RECORD NUMBER History obtained from family if available, nursing notes, old chart and ekg, as well as notes from prior ED visits. Patient presented for low back pain, we will assess with labs and imaging as indicated at this time.   Procedures ____________________________________________   LABS (pertinent positives/negatives)  Labs Reviewed  COMPREHENSIVE METABOLIC PANEL - Abnormal; Notable for the following components:      Result Value   Glucose, Bld 113 (*)    GFR calc non Af Amer 57 (*)    All other components within normal limits  CBC WITH DIFFERENTIAL/PLATELET  TROPONIN I  GLUCOSE, CAPILLARY  URINALYSIS, COMPLETE (UACMP) WITH MICROSCOPIC  CBG MONITORING, ED     RADIOLOGY  Lumbar spine x-rays  IMPRESSION: Postoperative change in the lower lumbar region with support hardware intact. Multilevel arthropathy. No fracture or spondylolisthesis. There is aortoiliac atherosclerosis.  Aortic Atherosclerosis (ICD10-I70.0). ____________________________________________  DIFFERENTIAL DIAGNOSIS   Acute on chronic pain, weakness, depression, dehydration, electrolyte abnormality, occult infection  FINAL ASSESSMENT AND PLAN  Low back pain, weakness   Plan: The patient had presented for worsening low back pain. Patient's labs reveal any acute process. Patient's imaging reveals previous hardware which is unremarkable with previous arthropathy.  The family showed up and wants the patient to go back to the nursing home and they will arrange transport back to [place] where she can get a higher level of care.  We will try steroids and pain medicine as needed for her back.   Ulice DashJohnathan E Williams, MD   Note: This note was  generated in part or whole with voice recognition software. Voice recognition is usually quite accurate but there are transcription errors that can and very often do occur. I apologize for any typographical errors that were not detected and corrected.     Emily Filbert, MD 01/22/18 [phone]

## 2018-01-22 NOTE — ED Triage Notes (Signed)
[facility] with reports of low back pain that started this morning. Pt has hx of back issues.  Per EMS pt c/o back pain when standing and walking.  Pt alert to self.

## 2018-01-22 NOTE — Evaluation (Signed)
Physical Therapy Evaluation Patient Details Name: Sherry Brooks MRN: [id number] DOB: 1929-01-02 Today's Date: 01/22/2018   History of Present Illness  84 y/o female here with severe back pain.  Clinical Impression  Pt with low back pain at rest that increased with any activity. She showed good effort and motivation to do what she could, but ultimately showed very poor tolerance with ambulation (forward flexed posture, dragging feet (R>L), quickly fatigued, increased pain.  Overall pt is very limited and likely would have a hard time managing in her current ALF, PT recommending STR with possible transition to higher level of care given her acute on chronic limitations.     Follow Up Recommendations SNF    Equipment Recommendations       Recommendations for Other Services       Precautions / Restrictions Precautions Precautions: Fall Restrictions Weight Bearing Restrictions: No      Mobility  Bed Mobility Overal bed mobility: Needs Assistance Bed Mobility: Supine to Sit;Sit to Supine     Supine to sit: Mod assist Sit to supine: Max assist   General bed mobility comments: Pt struggled to assist with getting to sitting, she appeared to be limited by weakness as much as pain  Transfers Overall transfer level: Needs assistance Equipment used: Rolling walker (2 wheeled) Transfers: Sit to/from Stand Sit to Stand: Min assist;Mod assist         General transfer comment: Some assist to get to standing from (elevated) bed height, able to use UEs relatively well  Ambulation/Gait Ambulation/Gait assistance: Mod assist;Min assist Gait Distance (Feet): 16 Feet Assistive device: Rolling walker (2 wheeled)       General Gait Details: Pt with very slow, hesitant, shuffling gait. She struggled to clear both toes, but especially so with the R (dragging it behind much of the time).  Apparently she is stooped, slow and with no foot clearance at baseline, but is even worse than  her normal now and only able to tolerate very minimal distances secondary to LBP  Stairs            Wheelchair Mobility    Modified Rankin (Stroke Patients Only)       Balance Overall balance assessment: Needs assistance Sitting-balance support: Feet supported;Bilateral upper extremity supported Sitting balance-Leahy Scale: Fair       Standing balance-Leahy Scale: Poor Standing balance comment: forward flexed, leaning on walker, generally poor confidence                             Pertinent Vitals/Pain Pain Assessment: (pt did not rate it, but c/o increasing LBP w/ increased mvt)    Home Living Family/patient expects to be discharged to:: Skilled nursing facility                 Additional Comments: pt is a [facility]    Prior Function Level of Independence: Independent with assistive device(s)         Comments: Pt generally has been able to walk to meals 3x/day with rollator but does need assist with most ADLs     Hand Dominance        Extremity/Trunk Assessment   Upper Extremity Assessment Upper Extremity Assessment: Generalized weakness    Lower Extremity Assessment Lower Extremity Assessment: Generalized weakness       Communication   Communication: No difficulties  Cognition Arousal/Alertness: Awake/alert Behavior During Therapy: WFL for tasks assessed/performed Overall Cognitive Status:  Within Functional Limits for tasks assessed                                        General Comments      Exercises     Assessment/Plan    PT Assessment Patient needs continued PT services  PT Problem List Decreased strength;Decreased range of motion;Decreased activity tolerance;Decreased balance;Decreased mobility;Decreased coordination;Decreased knowledge of use of DME;Decreased safety awareness;Pain;Cardiopulmonary status limiting activity       PT Treatment Interventions DME instruction;Gait  training;Stair training;Functional mobility training;Therapeutic activities;Balance training;Therapeutic exercise;Neuromuscular re-education;Patient/family education    PT Goals (Current goals can be found in the Care Plan section)  Acute Rehab PT Goals Patient Stated Goal: less back pain PT Goal Formulation: With patient Time For Goal Achievement: 02/05/18 Potential to Achieve Goals: Fair    Frequency Min 2X/week   Barriers to discharge        Co-evaluation               AM-PAC PT "6 Clicks" Daily Activity  Outcome Measure Difficulty turning over in bed (including adjusting bedclothes, sheets and blankets)?: Unable Difficulty moving from lying on back to sitting on the side of the bed? : Unable Difficulty sitting down on and standing up from a chair with arms (e.g., wheelchair, bedside commode, etc,.)?: Unable Help needed moving to and from a bed to chair (including a wheelchair)?: Total Help needed walking in hospital room?: Total Help needed climbing 3-5 steps with a railing? : Total 6 Click Score: 6    End of Session Equipment Utilized During Treatment: Gait belt Activity Tolerance: Patient limited by pain;Patient limited by fatigue Patient left: in bed;with call bell/phone within reach;with family/visitor present   PT Visit Diagnosis: Muscle weakness (generalized) (M62.81);Difficulty in walking, not elsewhere classified (R26.2);Pain Pain - part of body: (lumbago)    Time: 1205-1229 PT Time Calculation (min) (ACUTE ONLY): 24 min   Charges:   PT Evaluation $PT Eval Low Complexity: 1 Low          Malachi Pro, DPT 01/22/2018, 1:46 PM

## 2018-01-22 NOTE — ED Notes (Signed)
Pt verbaized discharge instructions and has no questions at this time. Pt unable to e sign because of visual impairment.

## 2018-01-22 NOTE — Clinical Social Work Note (Signed)
CSW received consult for "Can't walk." Per EDP Dr. Mayford KnifeWilliams, patient's family taking her back [facility] and will look into long term care skilled placement at that time. CSW signing off as no further needs identified.   Corlis HoveJeneya Kaleel Schmieder, Theresia MajorsLCSWA, [facility] Clinical Social Worker-Emergency Department [phone]

## 2018-01-23 DIAGNOSIS — F339 Major depressive disorder, recurrent, unspecified: Secondary | ICD-10-CM | POA: Diagnosis not present

## 2018-01-23 DIAGNOSIS — F0390 Unspecified dementia without behavioral disturbance: Secondary | ICD-10-CM | POA: Diagnosis not present

## 2018-01-26 DIAGNOSIS — R269 Unspecified abnormalities of gait and mobility: Secondary | ICD-10-CM | POA: Diagnosis not present

## 2018-01-26 DIAGNOSIS — J811 Chronic pulmonary edema: Secondary | ICD-10-CM | POA: Diagnosis not present

## 2018-01-26 DIAGNOSIS — M545 Low back pain: Secondary | ICD-10-CM | POA: Diagnosis not present

## 2018-01-27 DIAGNOSIS — I739 Peripheral vascular disease, unspecified: Secondary | ICD-10-CM | POA: Diagnosis not present

## 2018-01-27 DIAGNOSIS — E0822 Diabetes mellitus due to underlying condition with diabetic chronic kidney disease: Secondary | ICD-10-CM | POA: Diagnosis not present

## 2018-01-27 DIAGNOSIS — B351 Tinea unguium: Secondary | ICD-10-CM | POA: Diagnosis not present

## 2018-01-27 DIAGNOSIS — E538 Deficiency of other specified B group vitamins: Secondary | ICD-10-CM | POA: Diagnosis not present

## 2018-01-29 DIAGNOSIS — I890 Lymphedema, not elsewhere classified: Secondary | ICD-10-CM | POA: Diagnosis not present

## 2018-01-29 DIAGNOSIS — F339 Major depressive disorder, recurrent, unspecified: Secondary | ICD-10-CM | POA: Diagnosis not present

## 2018-01-29 DIAGNOSIS — N3281 Overactive bladder: Secondary | ICD-10-CM | POA: Diagnosis not present

## 2018-01-30 DIAGNOSIS — N3281 Overactive bladder: Secondary | ICD-10-CM | POA: Diagnosis not present

## 2018-02-01 ENCOUNTER — Emergency Department: Payer: Medicare Other

## 2018-02-01 ENCOUNTER — Encounter: Payer: Self-pay | Admitting: Emergency Medicine

## 2018-02-01 ENCOUNTER — Emergency Department
Admission: EM | Admit: 2018-02-01 | Discharge: 2018-02-01 | Disposition: A | Discharge status: Home / Self Care | Payer: Medicare Other | Attending: Emergency Medicine | Admitting: Emergency Medicine

## 2018-02-01 DIAGNOSIS — I129 Hypertensive chronic kidney disease with stage 1 through stage 4 chronic kidney disease, or unspecified chronic kidney disease: Secondary | ICD-10-CM | POA: Insufficient documentation

## 2018-02-01 DIAGNOSIS — Z96653 Presence of artificial knee joint, bilateral: Secondary | ICD-10-CM | POA: Diagnosis not present

## 2018-02-01 DIAGNOSIS — Z79899 Other long term (current) drug therapy: Secondary | ICD-10-CM | POA: Diagnosis not present

## 2018-02-01 DIAGNOSIS — W01198A Fall on same level from slipping, tripping and stumbling with subsequent striking against other object, initial encounter: Secondary | ICD-10-CM | POA: Diagnosis not present

## 2018-02-01 DIAGNOSIS — M6281 Muscle weakness (generalized): Secondary | ICD-10-CM | POA: Diagnosis not present

## 2018-02-01 DIAGNOSIS — N183 Chronic kidney disease, stage 3 (moderate): Secondary | ICD-10-CM | POA: Diagnosis not present

## 2018-02-01 DIAGNOSIS — S3992XA Unspecified injury of lower back, initial encounter: Secondary | ICD-10-CM | POA: Diagnosis not present

## 2018-02-01 DIAGNOSIS — E1122 Type 2 diabetes mellitus with diabetic chronic kidney disease: Secondary | ICD-10-CM | POA: Diagnosis not present

## 2018-02-01 DIAGNOSIS — S20319A Abrasion of unspecified front wall of thorax, initial encounter: Secondary | ICD-10-CM | POA: Diagnosis not present

## 2018-02-01 DIAGNOSIS — Y939 Activity, unspecified: Secondary | ICD-10-CM | POA: Insufficient documentation

## 2018-02-01 DIAGNOSIS — S79912A Unspecified injury of left hip, initial encounter: Secondary | ICD-10-CM | POA: Diagnosis not present

## 2018-02-01 DIAGNOSIS — Y92009 Unspecified place in unspecified non-institutional (private) residence as the place of occurrence of the external cause: Secondary | ICD-10-CM | POA: Diagnosis not present

## 2018-02-01 DIAGNOSIS — R260 Ataxic gait: Secondary | ICD-10-CM | POA: Diagnosis not present

## 2018-02-01 DIAGNOSIS — Y999 Unspecified external cause status: Secondary | ICD-10-CM | POA: Diagnosis not present

## 2018-02-01 DIAGNOSIS — T148XXA Other injury of unspecified body region, initial encounter: Secondary | ICD-10-CM

## 2018-02-01 DIAGNOSIS — W19XXXA Unspecified fall, initial encounter: Secondary | ICD-10-CM | POA: Diagnosis not present

## 2018-02-01 DIAGNOSIS — M549 Dorsalgia, unspecified: Secondary | ICD-10-CM | POA: Diagnosis not present

## 2018-02-01 DIAGNOSIS — S0990XA Unspecified injury of head, initial encounter: Secondary | ICD-10-CM | POA: Diagnosis not present

## 2018-02-01 DIAGNOSIS — R270 Ataxia, unspecified: Secondary | ICD-10-CM | POA: Diagnosis not present

## 2018-02-01 DIAGNOSIS — S1091XA Abrasion of unspecified part of neck, initial encounter: Secondary | ICD-10-CM | POA: Insufficient documentation

## 2018-02-01 LAB — GLUCOSE, CAPILLARY: Glucose-Capillary: 162 mg/dL — ABNORMAL HIGH (ref 70–99)

## 2018-02-01 MED ORDER — ACETAMINOPHEN 500 MG PO TABS
500.0000 mg | ORAL_TABLET | Freq: Once | ORAL | Status: AC
  Administered 2018-02-01: 500 mg via ORAL
  Filled 2018-02-01: qty 1

## 2018-02-01 NOTE — ED Provider Notes (Signed)
[facility] Emergency Department Provider Note   ____________________________________________   First MD Initiated Contact with Patient 02/01/18 [phone]     (approximate)  I have reviewed the triage vital signs and the nursing notes.   HISTORY  Chief Complaint Fall   HPI Sherry Brooks is a 84 y.o. female with history of mild dementia, depression peripheral vascular disease and diabetes  Patient reports that this morning she got up and was using her walker, when she fell forward.  She reports she stumbled with her legs underneath her falling forward landing "face down in the carpet".  She feels a little bit sore across the lower part of her neck.  Denies headache or neck pain.  Reports she has pain in her lower back, but this is an every day pain and not new.  A little bit sore over the left hip, but denies that its extremely painful and feels more like she is a bit bruised up.  No chest pain or trouble breathing.  No nausea vomiting.  She reports that she stumbled forward causing her to fall over the walker.  Did not lose consciousness.    Past Medical History:  Diagnosis Date  . Anxiety   . Arthritis    knees  . Borderline diabetic   . Complication of anesthesia    wakes up slowly, takes longer than usual to wake  . Depression   . Diabetes (HCC)   . GERD (gastroesophageal reflux disease)    on occas. uses TUMS for indigestion   . Heart murmur   . History of kidney stones   . History of stress test    10 yrs. ago, states it was normal , had been ordered for pt. due to stress related to husband's illness,, by Dr. Vic Ripper  . Hyperlipemia   . Hypertension   . Macular degeneration   . Peripheral vascular disease (HCC)    blood clots in legs mid 2000's   . Renal calculus    history of > 50 yrs. ago    Patient Active Problem List   Diagnosis Date Noted  . S/P total knee arthroplasty 03/24/2017  . UTI (urinary tract infection)  03/09/2017  . H/O diverticulitis of colon 07/29/2016  . Broken arm 07/29/2016  . Chronic venous insufficiency 04/22/2016  . Pain in limb 04/22/2016  . Abnormal finding on mammography 03/28/2015  . Adult BMI 30+ 03/28/2015  . Chronic kidney disease (CKD), stage III (moderate) (HCC) 03/28/2015  . Chronic LBP 03/28/2015  . Can't get food down 03/28/2015  . Lymphedema 03/28/2015  . Degeneration macular 03/28/2015  . Amnesia 03/28/2015  . Subclinical hypothyroidism 03/28/2015  . Hypercholesterolemia 03/28/2015  . Pain in the chest 02/14/2015  . SOB (shortness of breath) 02/14/2015  . Morbid obesity (HCC) 02/14/2015  . Unsteady gait 02/14/2015  . Primary osteoarthritis of both knees 02/14/2015  . Essential hypertension 02/14/2015  . Anxiety and depression 02/14/2015  . Joint pain 01/04/2014  . Bursitis, hip 10/19/2013  . Spondylolisthesis 10/28/2012  . Arthritis, degenerative 08/25/2009  . Type 2 diabetes mellitus (HCC) 07/03/2007  . Colon, diverticulosis 02/12/2001  . OP (osteoporosis) 02/12/2001    Past Surgical History:  Procedure Laterality Date  . APPENDECTOMY     as a teenager   . BIOPSY THYROID  06/2008  . COLON SURGERY     due to diverticulitis- had colostomy, then takedown   . EYE SURGERY     cataracts removed - /w IOL  .  FRACTURE SURGERY     L wrist, plate in place  . JOINT REPLACEMENT     both knees   . SPINE SURGERY  10/23/2012   Spinal Fusion, lower back  . TOTAL KNEE REVISION Left 03/24/2017   Procedure: TOTAL KNEE REVISION;  Surgeon: Donato Heinz, MD;  Location: [facility] ORS;  Service: Orthopedics;  Laterality: Left;    Prior to Admission medications   Medication Sig Start Date End Date Taking? Authorizing Provider  acetaminophen (TYLENOL) 650 MG CR tablet Take by mouth.    [provider]  ARTIFICIAL TEAR SOLUTION OP Apply 1 drop to eye daily.     [provider]  B Complex Vitamins (VITAMIN B COMPLEX PO) Take 1 tablet by mouth daily.      [provider]  donepezil (ARICEPT) 5 MG tablet Take by mouth. 11/15/16   [provider]  enoxaparin (LOVENOX) 40 MG/0.4ML injection Inject 0.4 mLs (40 mg total) into the skin daily. 03/25/17 04/08/17  Tera Partridge, PA  lisinopril-hydrochlorothiazide (PRINZIDE,ZESTORETIC) 20-25 MG tablet Take by mouth. 02/01/13   [provider]  Multiple Minerals-Vitamins (CALCIUM-MAGNESIUM-ZINC-D3 PO) Take 1 tablet by mouth daily.    [provider]  Multiple Vitamins-Minerals (PRESERVISION AREDS) CAPS Take 2 capsules by mouth daily.     [provider]  Olopatadine HCl (PATADAY) 0.2 % SOLN Apply to eye.    [provider]  oxyCODONE (OXY IR/ROXICODONE) 5 MG immediate release tablet Take by mouth. 03/25/17   [provider]  oxyCODONE-acetaminophen (PERCOCET) 5-325 MG tablet Take 1 tablet by mouth 2 (two) times daily with a meal. 01/22/18   Emily Filbert, MD  pravastatin (PRAVACHOL) 10 MG tablet TAKE 1 TABLET BY MOUTH EVERY NIGHT AT BEDTIME 12/24/16   [provider]  Psyllium (METAMUCIL PO) Take 6-7 capsules by mouth daily.     [provider]  sertraline (ZOLOFT) 50 MG tablet Take by mouth. 11/15/16   [provider]  traMADol (ULTRAM) 50 MG tablet Take 1-2 tablets (50-100 mg total) by mouth every 4 (four) hours as needed (mild pain). 03/27/17   Tera Partridge, PA  traMADol (ULTRAM) 50 MG tablet Take by mouth. 03/25/17   [provider]  vitamin E 400 UNIT capsule Take 400 Units by mouth daily.     [provider]    Allergies Patient has no known allergies.  Family History  Problem Relation Age of Onset  . Arthritis Mother   . Arthritis Father   . Bladder Cancer Neg Hx   . Kidney cancer Neg Hx   . Prostate cancer Neg Hx     Social History Social History   Tobacco Use  . Smoking status: Never Smoker  . Smokeless tobacco: Never Used  Substance Use Topics  . Alcohol use: No    Comment:  glass of wine, rare occas.   . Drug use: No    Review of Systems Constitutional: No fever/chills ENT: No neck pain or trouble breathing Cardiovascular: Denies chest pain. Respiratory: Denies shortness of breath. Gastrointestinal: No abdominal pain.   Musculoskeletal: Negative for back pain. Skin: Negative for rash. Neurological: Negative for headaches, focal weakness or numbness.    ____________________________________________   PHYSICAL EXAM:  VITAL SIGNS: ED Triage Vitals  Enc Vitals Group     BP 02/01/18 0647 (!) 146/67     Pulse Rate 02/01/18 0647 75     Resp 02/01/18 0647 15     Temp 02/01/18 0647 97.9 F (36.6  C)     Temp Source 02/01/18 0647 Oral     SpO2 02/01/18 0647 92 %     Weight 02/01/18 0648 205 lb 0.4 oz (93 kg)     Height 02/01/18 0648 5\' 2"  (1.575 m)     Head Circumference --      Peak Flow --      Pain Score 02/01/18 0648 8     Pain Loc --      Pain Edu? --      Excl. in GC? --     Constitutional: Alert and oriented. Well appearing and in no acute distress.  He has a history of noted dementia, but she is alert well oriented and able to give consistent history.  She is very pleasant.  Eyes: Conjunctivae are normal. Head: Atraumatic. Nose: No congestion/rhinnorhea. Mouth/Throat: Mucous membranes are moist. Neck: No stridor.  Logrolled with nurse, no cervical or thoracic tenderness.  Reports some tenderness to palpation in the very lower portions of the lumbar spine, but does report this is chronic in nature.  No step-offs or deformities.  No lesions on the back. Cardiovascular: Normal rate, regular rhythm. Grossly normal heart sounds.  Good peripheral circulation. Respiratory: Normal respiratory effort.  No retractions. Lungs CTAB. Gastrointestinal: Soft and nontender. No distention. Musculoskeletal: No lower extremity tenderness nor edema.  Able to move all of her extremities well to command except she reports some feeling of slight discomfort over  the left lateral hip region without bruising.  Strong peripheral pulses lower extremities bilateral.  Normal cap refill. Neurologic:  Normal speech and language. No gross focal neurologic deficits are appreciated.  Skin:  Skin is warm, dry and intact. No rash noted except there is an abrasion consistent with a "carpet burn" about the size of a palm located at the base of her anterior neck and over her upper chest without bleeding or laceration. Psychiatric: Mood and affect are normal. Speech and behavior are normal.  ____________________________________________   LABS (all labs ordered are listed, but only abnormal results are displayed)  Labs Reviewed  GLUCOSE, CAPILLARY - Abnormal; Notable for the following components:      Result Value   Glucose-Capillary 162 (*)    All other components within normal limits  CBG MONITORING, ED   ____________________________________________  EKG  Reviewed and interpreted by me at 745 Heart rate 70 QRS 140 QTc 500 Normal sinus rhythm, left bundle branch block. ____________________________________________  RADIOLOGY  CT scans reviewed, no acute injuries to noted.  Left hip film negative for fracture. ____________________________________________   PROCEDURES  Procedure(s) performed: None  Procedures  Critical Care performed: No  ____________________________________________   INITIAL IMPRESSION / ASSESSMENT AND PLAN / ED COURSE  Pertinent labs & imaging results that were available during my care of the patient were reviewed by me and considered in my medical decision making (see chart for details).  Patient presents for evaluation of a fall, clinical history is suspicious for a mechanical fall.  Denies any associated preceding symptoms or ongoing symptoms other than primarily lower back discomfort which appears to be chronic in nature with reassuring imaging studies of the head neck and lumbar region.  Additionally some slight  discomfort of the left hip but able to range her hip well without shortening deformity or rotation.  Suspect low risk for clinical fracture, and will treat with Tylenol and is reevaluate ambulation thereafter if imaging studies do not show a fracture.  Based on examination I would find this low risk  for left hip fracture.   ----------------------------------------- 9:35 AM on 02/01/2018 -----------------------------------------  Patient's family including daughter-in-law at the bedside.  She reports that she is acting appropriately, patient with no complaints at present time and was able to get up and ambulate with a walker without difficulty imbalance or feeling as though she was to fall.  Reports feeling improved after Tylenol.  Return precautions and treatment recommendations and follow-up discussed with the patient who is agreeable with the plan.      ____________________________________________   FINAL CLINICAL IMPRESSION(S) / ED DIAGNOSES  Final diagnoses:  Fall, initial encounter  Abrasion  Accident due to mechanical fall without injury, initial encounter      NEW MEDICATIONS STARTED DURING THIS VISIT:  New Prescriptions   No medications on file     Note:  This document was prepared using Dragon voice recognition software and may include unintentional dictation errors.     Sharyn Creamer, MD 02/01/18 [phone]

## 2018-02-01 NOTE — ED Notes (Signed)
Pt to CT at this time.

## 2018-02-01 NOTE — Discharge Instructions (Addendum)

## 2018-02-01 NOTE — ED Triage Notes (Signed)
Pt arrives via [facility] with c/o fall. Pt comes from [facility] and was ambulating with her walker when she experienced a mechanical fall. Pt reports no LOC or anticoagulants. Pt is in NAD.

## 2018-02-02 DIAGNOSIS — G894 Chronic pain syndrome: Secondary | ICD-10-CM | POA: Diagnosis not present

## 2018-02-02 DIAGNOSIS — F419 Anxiety disorder, unspecified: Secondary | ICD-10-CM | POA: Diagnosis not present

## 2018-02-02 DIAGNOSIS — I890 Lymphedema, not elsewhere classified: Secondary | ICD-10-CM | POA: Diagnosis not present

## 2018-02-03 DIAGNOSIS — R278 Other lack of coordination: Secondary | ICD-10-CM | POA: Diagnosis not present

## 2018-02-03 DIAGNOSIS — M1991 Primary osteoarthritis, unspecified site: Secondary | ICD-10-CM | POA: Diagnosis not present

## 2018-02-03 DIAGNOSIS — I10 Essential (primary) hypertension: Secondary | ICD-10-CM | POA: Diagnosis not present

## 2018-02-03 DIAGNOSIS — M6281 Muscle weakness (generalized): Secondary | ICD-10-CM | POA: Diagnosis not present

## 2018-02-03 DIAGNOSIS — R2689 Other abnormalities of gait and mobility: Secondary | ICD-10-CM | POA: Diagnosis not present

## 2018-02-04 DIAGNOSIS — R2689 Other abnormalities of gait and mobility: Secondary | ICD-10-CM | POA: Diagnosis not present

## 2018-02-04 DIAGNOSIS — I10 Essential (primary) hypertension: Secondary | ICD-10-CM | POA: Diagnosis not present

## 2018-02-04 DIAGNOSIS — R278 Other lack of coordination: Secondary | ICD-10-CM | POA: Diagnosis not present

## 2018-02-04 DIAGNOSIS — M1991 Primary osteoarthritis, unspecified site: Secondary | ICD-10-CM | POA: Diagnosis not present

## 2018-02-04 DIAGNOSIS — M6281 Muscle weakness (generalized): Secondary | ICD-10-CM | POA: Diagnosis not present

## 2018-02-06 DIAGNOSIS — M6281 Muscle weakness (generalized): Secondary | ICD-10-CM | POA: Diagnosis not present

## 2018-02-06 DIAGNOSIS — M1991 Primary osteoarthritis, unspecified site: Secondary | ICD-10-CM | POA: Diagnosis not present

## 2018-02-06 DIAGNOSIS — I10 Essential (primary) hypertension: Secondary | ICD-10-CM | POA: Diagnosis not present

## 2018-02-06 DIAGNOSIS — R278 Other lack of coordination: Secondary | ICD-10-CM | POA: Diagnosis not present

## 2018-02-06 DIAGNOSIS — R2689 Other abnormalities of gait and mobility: Secondary | ICD-10-CM | POA: Diagnosis not present

## 2018-02-07 DIAGNOSIS — F339 Major depressive disorder, recurrent, unspecified: Secondary | ICD-10-CM | POA: Diagnosis not present

## 2018-02-07 DIAGNOSIS — F0390 Unspecified dementia without behavioral disturbance: Secondary | ICD-10-CM | POA: Diagnosis not present

## 2018-02-09 DIAGNOSIS — R2689 Other abnormalities of gait and mobility: Secondary | ICD-10-CM | POA: Diagnosis not present

## 2018-02-09 DIAGNOSIS — R278 Other lack of coordination: Secondary | ICD-10-CM | POA: Diagnosis not present

## 2018-02-09 DIAGNOSIS — I10 Essential (primary) hypertension: Secondary | ICD-10-CM | POA: Diagnosis not present

## 2018-02-09 DIAGNOSIS — M1991 Primary osteoarthritis, unspecified site: Secondary | ICD-10-CM | POA: Diagnosis not present

## 2018-02-09 DIAGNOSIS — M6281 Muscle weakness (generalized): Secondary | ICD-10-CM | POA: Diagnosis not present

## 2018-02-10 DIAGNOSIS — I10 Essential (primary) hypertension: Secondary | ICD-10-CM | POA: Diagnosis not present

## 2018-02-10 DIAGNOSIS — M1991 Primary osteoarthritis, unspecified site: Secondary | ICD-10-CM | POA: Diagnosis not present

## 2018-02-10 DIAGNOSIS — R278 Other lack of coordination: Secondary | ICD-10-CM | POA: Diagnosis not present

## 2018-02-10 DIAGNOSIS — R2689 Other abnormalities of gait and mobility: Secondary | ICD-10-CM | POA: Diagnosis not present

## 2018-02-10 DIAGNOSIS — M6281 Muscle weakness (generalized): Secondary | ICD-10-CM | POA: Diagnosis not present

## 2018-02-11 DIAGNOSIS — M1991 Primary osteoarthritis, unspecified site: Secondary | ICD-10-CM | POA: Diagnosis not present

## 2018-02-11 DIAGNOSIS — R278 Other lack of coordination: Secondary | ICD-10-CM | POA: Diagnosis not present

## 2018-02-11 DIAGNOSIS — M6281 Muscle weakness (generalized): Secondary | ICD-10-CM | POA: Diagnosis not present

## 2018-02-11 DIAGNOSIS — R2689 Other abnormalities of gait and mobility: Secondary | ICD-10-CM | POA: Diagnosis not present

## 2018-02-11 DIAGNOSIS — I10 Essential (primary) hypertension: Secondary | ICD-10-CM | POA: Diagnosis not present

## 2018-02-12 DIAGNOSIS — R2689 Other abnormalities of gait and mobility: Secondary | ICD-10-CM | POA: Diagnosis not present

## 2018-02-12 DIAGNOSIS — M6281 Muscle weakness (generalized): Secondary | ICD-10-CM | POA: Diagnosis not present

## 2018-02-12 DIAGNOSIS — M1991 Primary osteoarthritis, unspecified site: Secondary | ICD-10-CM | POA: Diagnosis not present

## 2018-02-12 DIAGNOSIS — R278 Other lack of coordination: Secondary | ICD-10-CM | POA: Diagnosis not present

## 2018-02-12 DIAGNOSIS — I10 Essential (primary) hypertension: Secondary | ICD-10-CM | POA: Diagnosis not present

## 2018-02-13 DIAGNOSIS — F339 Major depressive disorder, recurrent, unspecified: Secondary | ICD-10-CM | POA: Diagnosis not present

## 2018-02-13 DIAGNOSIS — F0390 Unspecified dementia without behavioral disturbance: Secondary | ICD-10-CM | POA: Diagnosis not present

## 2018-02-16 DIAGNOSIS — M48062 Spinal stenosis, lumbar region with neurogenic claudication: Secondary | ICD-10-CM | POA: Diagnosis not present

## 2018-02-16 DIAGNOSIS — I10 Essential (primary) hypertension: Secondary | ICD-10-CM | POA: Diagnosis not present

## 2018-02-16 DIAGNOSIS — R278 Other lack of coordination: Secondary | ICD-10-CM | POA: Diagnosis not present

## 2018-02-16 DIAGNOSIS — M5136 Other intervertebral disc degeneration, lumbar region: Secondary | ICD-10-CM | POA: Diagnosis not present

## 2018-02-16 DIAGNOSIS — M1991 Primary osteoarthritis, unspecified site: Secondary | ICD-10-CM | POA: Diagnosis not present

## 2018-02-16 DIAGNOSIS — M5416 Radiculopathy, lumbar region: Secondary | ICD-10-CM | POA: Diagnosis not present

## 2018-02-16 DIAGNOSIS — M6281 Muscle weakness (generalized): Secondary | ICD-10-CM | POA: Diagnosis not present

## 2018-02-16 DIAGNOSIS — R2689 Other abnormalities of gait and mobility: Secondary | ICD-10-CM | POA: Diagnosis not present

## 2018-02-17 DIAGNOSIS — I10 Essential (primary) hypertension: Secondary | ICD-10-CM | POA: Diagnosis not present

## 2018-02-17 DIAGNOSIS — M6281 Muscle weakness (generalized): Secondary | ICD-10-CM | POA: Diagnosis not present

## 2018-02-17 DIAGNOSIS — R2689 Other abnormalities of gait and mobility: Secondary | ICD-10-CM | POA: Diagnosis not present

## 2018-02-17 DIAGNOSIS — M1991 Primary osteoarthritis, unspecified site: Secondary | ICD-10-CM | POA: Diagnosis not present

## 2018-02-17 DIAGNOSIS — R278 Other lack of coordination: Secondary | ICD-10-CM | POA: Diagnosis not present

## 2018-02-18 DIAGNOSIS — R278 Other lack of coordination: Secondary | ICD-10-CM | POA: Diagnosis not present

## 2018-02-18 DIAGNOSIS — R2689 Other abnormalities of gait and mobility: Secondary | ICD-10-CM | POA: Diagnosis not present

## 2018-02-18 DIAGNOSIS — I10 Essential (primary) hypertension: Secondary | ICD-10-CM | POA: Diagnosis not present

## 2018-02-18 DIAGNOSIS — M1991 Primary osteoarthritis, unspecified site: Secondary | ICD-10-CM | POA: Diagnosis not present

## 2018-02-18 DIAGNOSIS — M6281 Muscle weakness (generalized): Secondary | ICD-10-CM | POA: Diagnosis not present

## 2018-02-19 DIAGNOSIS — I890 Lymphedema, not elsewhere classified: Secondary | ICD-10-CM | POA: Diagnosis not present

## 2018-02-19 DIAGNOSIS — F339 Major depressive disorder, recurrent, unspecified: Secondary | ICD-10-CM | POA: Diagnosis not present

## 2018-02-19 DIAGNOSIS — G894 Chronic pain syndrome: Secondary | ICD-10-CM | POA: Diagnosis not present

## 2018-02-19 DIAGNOSIS — F0390 Unspecified dementia without behavioral disturbance: Secondary | ICD-10-CM | POA: Diagnosis not present

## 2018-02-20 DIAGNOSIS — I10 Essential (primary) hypertension: Secondary | ICD-10-CM | POA: Diagnosis not present

## 2018-02-20 DIAGNOSIS — M1991 Primary osteoarthritis, unspecified site: Secondary | ICD-10-CM | POA: Diagnosis not present

## 2018-02-20 DIAGNOSIS — R278 Other lack of coordination: Secondary | ICD-10-CM | POA: Diagnosis not present

## 2018-02-20 DIAGNOSIS — R2689 Other abnormalities of gait and mobility: Secondary | ICD-10-CM | POA: Diagnosis not present

## 2018-02-20 DIAGNOSIS — M6281 Muscle weakness (generalized): Secondary | ICD-10-CM | POA: Diagnosis not present

## 2018-02-21 DIAGNOSIS — F339 Major depressive disorder, recurrent, unspecified: Secondary | ICD-10-CM | POA: Diagnosis not present

## 2018-02-21 DIAGNOSIS — F0390 Unspecified dementia without behavioral disturbance: Secondary | ICD-10-CM | POA: Diagnosis not present

## 2018-02-23 DIAGNOSIS — I10 Essential (primary) hypertension: Secondary | ICD-10-CM | POA: Diagnosis not present

## 2018-02-23 DIAGNOSIS — R2689 Other abnormalities of gait and mobility: Secondary | ICD-10-CM | POA: Diagnosis not present

## 2018-02-23 DIAGNOSIS — M1991 Primary osteoarthritis, unspecified site: Secondary | ICD-10-CM | POA: Diagnosis not present

## 2018-02-23 DIAGNOSIS — M6281 Muscle weakness (generalized): Secondary | ICD-10-CM | POA: Diagnosis not present

## 2018-02-23 DIAGNOSIS — R278 Other lack of coordination: Secondary | ICD-10-CM | POA: Diagnosis not present

## 2018-02-24 DIAGNOSIS — M6281 Muscle weakness (generalized): Secondary | ICD-10-CM | POA: Diagnosis not present

## 2018-02-24 DIAGNOSIS — R2689 Other abnormalities of gait and mobility: Secondary | ICD-10-CM | POA: Diagnosis not present

## 2018-02-24 DIAGNOSIS — M1991 Primary osteoarthritis, unspecified site: Secondary | ICD-10-CM | POA: Diagnosis not present

## 2018-02-24 DIAGNOSIS — I10 Essential (primary) hypertension: Secondary | ICD-10-CM | POA: Diagnosis not present

## 2018-02-24 DIAGNOSIS — R278 Other lack of coordination: Secondary | ICD-10-CM | POA: Diagnosis not present

## 2018-02-26 DIAGNOSIS — R2689 Other abnormalities of gait and mobility: Secondary | ICD-10-CM | POA: Diagnosis not present

## 2018-02-26 DIAGNOSIS — I10 Essential (primary) hypertension: Secondary | ICD-10-CM | POA: Diagnosis not present

## 2018-02-26 DIAGNOSIS — M1991 Primary osteoarthritis, unspecified site: Secondary | ICD-10-CM | POA: Diagnosis not present

## 2018-02-26 DIAGNOSIS — J811 Chronic pulmonary edema: Secondary | ICD-10-CM | POA: Diagnosis not present

## 2018-02-26 DIAGNOSIS — R278 Other lack of coordination: Secondary | ICD-10-CM | POA: Diagnosis not present

## 2018-02-26 DIAGNOSIS — M6281 Muscle weakness (generalized): Secondary | ICD-10-CM | POA: Diagnosis not present

## 2018-02-26 DIAGNOSIS — M545 Low back pain: Secondary | ICD-10-CM | POA: Diagnosis not present

## 2018-02-26 DIAGNOSIS — R269 Unspecified abnormalities of gait and mobility: Secondary | ICD-10-CM | POA: Diagnosis not present

## 2018-02-27 DIAGNOSIS — F419 Anxiety disorder, unspecified: Secondary | ICD-10-CM | POA: Diagnosis not present

## 2018-02-27 DIAGNOSIS — M1991 Primary osteoarthritis, unspecified site: Secondary | ICD-10-CM | POA: Diagnosis not present

## 2018-02-27 DIAGNOSIS — F339 Major depressive disorder, recurrent, unspecified: Secondary | ICD-10-CM | POA: Diagnosis not present

## 2018-02-27 DIAGNOSIS — R2689 Other abnormalities of gait and mobility: Secondary | ICD-10-CM | POA: Diagnosis not present

## 2018-02-27 DIAGNOSIS — I10 Essential (primary) hypertension: Secondary | ICD-10-CM | POA: Diagnosis not present

## 2018-02-27 DIAGNOSIS — M6281 Muscle weakness (generalized): Secondary | ICD-10-CM | POA: Diagnosis not present

## 2018-02-27 DIAGNOSIS — R278 Other lack of coordination: Secondary | ICD-10-CM | POA: Diagnosis not present

## 2018-02-27 DIAGNOSIS — F0390 Unspecified dementia without behavioral disturbance: Secondary | ICD-10-CM | POA: Diagnosis not present

## 2018-03-02 DIAGNOSIS — R2689 Other abnormalities of gait and mobility: Secondary | ICD-10-CM | POA: Diagnosis not present

## 2018-03-02 DIAGNOSIS — M6281 Muscle weakness (generalized): Secondary | ICD-10-CM | POA: Diagnosis not present

## 2018-03-02 DIAGNOSIS — I10 Essential (primary) hypertension: Secondary | ICD-10-CM | POA: Diagnosis not present

## 2018-03-02 DIAGNOSIS — M1991 Primary osteoarthritis, unspecified site: Secondary | ICD-10-CM | POA: Diagnosis not present

## 2018-03-02 DIAGNOSIS — R278 Other lack of coordination: Secondary | ICD-10-CM | POA: Diagnosis not present

## 2018-03-16 DIAGNOSIS — F419 Anxiety disorder, unspecified: Secondary | ICD-10-CM | POA: Diagnosis not present

## 2018-03-16 DIAGNOSIS — F339 Major depressive disorder, recurrent, unspecified: Secondary | ICD-10-CM | POA: Diagnosis not present

## 2018-03-16 DIAGNOSIS — F0390 Unspecified dementia without behavioral disturbance: Secondary | ICD-10-CM | POA: Diagnosis not present

## 2018-03-23 DIAGNOSIS — F0390 Unspecified dementia without behavioral disturbance: Secondary | ICD-10-CM | POA: Diagnosis not present

## 2018-03-23 DIAGNOSIS — F339 Major depressive disorder, recurrent, unspecified: Secondary | ICD-10-CM | POA: Diagnosis not present

## 2018-03-23 DIAGNOSIS — G894 Chronic pain syndrome: Secondary | ICD-10-CM | POA: Diagnosis not present

## 2018-03-23 DIAGNOSIS — I890 Lymphedema, not elsewhere classified: Secondary | ICD-10-CM | POA: Diagnosis not present

## 2018-03-27 DIAGNOSIS — F419 Anxiety disorder, unspecified: Secondary | ICD-10-CM | POA: Diagnosis not present

## 2018-03-27 DIAGNOSIS — F0390 Unspecified dementia without behavioral disturbance: Secondary | ICD-10-CM | POA: Diagnosis not present

## 2018-03-27 DIAGNOSIS — F339 Major depressive disorder, recurrent, unspecified: Secondary | ICD-10-CM | POA: Diagnosis not present

## 2018-03-28 DIAGNOSIS — R269 Unspecified abnormalities of gait and mobility: Secondary | ICD-10-CM | POA: Diagnosis not present

## 2018-03-28 DIAGNOSIS — M545 Low back pain: Secondary | ICD-10-CM | POA: Diagnosis not present

## 2018-03-28 DIAGNOSIS — J811 Chronic pulmonary edema: Secondary | ICD-10-CM | POA: Diagnosis not present

## 2018-03-30 DIAGNOSIS — Z23 Encounter for immunization: Secondary | ICD-10-CM | POA: Diagnosis not present

## 2018-04-03 DIAGNOSIS — F339 Major depressive disorder, recurrent, unspecified: Secondary | ICD-10-CM | POA: Diagnosis not present

## 2018-04-03 DIAGNOSIS — R2689 Other abnormalities of gait and mobility: Secondary | ICD-10-CM | POA: Diagnosis not present

## 2018-04-03 DIAGNOSIS — M6281 Muscle weakness (generalized): Secondary | ICD-10-CM | POA: Diagnosis not present

## 2018-04-03 DIAGNOSIS — R278 Other lack of coordination: Secondary | ICD-10-CM | POA: Diagnosis not present

## 2018-04-03 DIAGNOSIS — I10 Essential (primary) hypertension: Secondary | ICD-10-CM | POA: Diagnosis not present

## 2018-04-03 DIAGNOSIS — F419 Anxiety disorder, unspecified: Secondary | ICD-10-CM | POA: Diagnosis not present

## 2018-04-03 DIAGNOSIS — M1991 Primary osteoarthritis, unspecified site: Secondary | ICD-10-CM | POA: Diagnosis not present

## 2018-04-03 DIAGNOSIS — F0390 Unspecified dementia without behavioral disturbance: Secondary | ICD-10-CM | POA: Diagnosis not present

## 2018-04-06 DIAGNOSIS — R2689 Other abnormalities of gait and mobility: Secondary | ICD-10-CM | POA: Diagnosis not present

## 2018-04-06 DIAGNOSIS — M1991 Primary osteoarthritis, unspecified site: Secondary | ICD-10-CM | POA: Diagnosis not present

## 2018-04-06 DIAGNOSIS — I10 Essential (primary) hypertension: Secondary | ICD-10-CM | POA: Diagnosis not present

## 2018-04-06 DIAGNOSIS — R278 Other lack of coordination: Secondary | ICD-10-CM | POA: Diagnosis not present

## 2018-04-06 DIAGNOSIS — M6281 Muscle weakness (generalized): Secondary | ICD-10-CM | POA: Diagnosis not present

## 2018-04-07 DIAGNOSIS — I10 Essential (primary) hypertension: Secondary | ICD-10-CM | POA: Diagnosis not present

## 2018-04-07 DIAGNOSIS — M6281 Muscle weakness (generalized): Secondary | ICD-10-CM | POA: Diagnosis not present

## 2018-04-07 DIAGNOSIS — M1991 Primary osteoarthritis, unspecified site: Secondary | ICD-10-CM | POA: Diagnosis not present

## 2018-04-07 DIAGNOSIS — R2689 Other abnormalities of gait and mobility: Secondary | ICD-10-CM | POA: Diagnosis not present

## 2018-04-07 DIAGNOSIS — R278 Other lack of coordination: Secondary | ICD-10-CM | POA: Diagnosis not present

## 2018-04-08 DIAGNOSIS — M1991 Primary osteoarthritis, unspecified site: Secondary | ICD-10-CM | POA: Diagnosis not present

## 2018-04-08 DIAGNOSIS — M6281 Muscle weakness (generalized): Secondary | ICD-10-CM | POA: Diagnosis not present

## 2018-04-08 DIAGNOSIS — I10 Essential (primary) hypertension: Secondary | ICD-10-CM | POA: Diagnosis not present

## 2018-04-08 DIAGNOSIS — R2689 Other abnormalities of gait and mobility: Secondary | ICD-10-CM | POA: Diagnosis not present

## 2018-04-08 DIAGNOSIS — R278 Other lack of coordination: Secondary | ICD-10-CM | POA: Diagnosis not present

## 2018-04-09 DIAGNOSIS — R2689 Other abnormalities of gait and mobility: Secondary | ICD-10-CM | POA: Diagnosis not present

## 2018-04-09 DIAGNOSIS — I10 Essential (primary) hypertension: Secondary | ICD-10-CM | POA: Diagnosis not present

## 2018-04-09 DIAGNOSIS — M1991 Primary osteoarthritis, unspecified site: Secondary | ICD-10-CM | POA: Diagnosis not present

## 2018-04-09 DIAGNOSIS — R278 Other lack of coordination: Secondary | ICD-10-CM | POA: Diagnosis not present

## 2018-04-09 DIAGNOSIS — M6281 Muscle weakness (generalized): Secondary | ICD-10-CM | POA: Diagnosis not present

## 2018-04-10 DIAGNOSIS — M48062 Spinal stenosis, lumbar region with neurogenic claudication: Secondary | ICD-10-CM | POA: Diagnosis not present

## 2018-04-10 DIAGNOSIS — R2689 Other abnormalities of gait and mobility: Secondary | ICD-10-CM | POA: Diagnosis not present

## 2018-04-10 DIAGNOSIS — M6281 Muscle weakness (generalized): Secondary | ICD-10-CM | POA: Diagnosis not present

## 2018-04-10 DIAGNOSIS — M5136 Other intervertebral disc degeneration, lumbar region: Secondary | ICD-10-CM | POA: Diagnosis not present

## 2018-04-10 DIAGNOSIS — M5416 Radiculopathy, lumbar region: Secondary | ICD-10-CM | POA: Diagnosis not present

## 2018-04-10 DIAGNOSIS — I10 Essential (primary) hypertension: Secondary | ICD-10-CM | POA: Diagnosis not present

## 2018-04-10 DIAGNOSIS — M1991 Primary osteoarthritis, unspecified site: Secondary | ICD-10-CM | POA: Diagnosis not present

## 2018-04-10 DIAGNOSIS — F419 Anxiety disorder, unspecified: Secondary | ICD-10-CM | POA: Diagnosis not present

## 2018-04-10 DIAGNOSIS — F339 Major depressive disorder, recurrent, unspecified: Secondary | ICD-10-CM | POA: Diagnosis not present

## 2018-04-10 DIAGNOSIS — F0390 Unspecified dementia without behavioral disturbance: Secondary | ICD-10-CM | POA: Diagnosis not present

## 2018-04-10 DIAGNOSIS — R278 Other lack of coordination: Secondary | ICD-10-CM | POA: Diagnosis not present

## 2018-04-13 DIAGNOSIS — M6281 Muscle weakness (generalized): Secondary | ICD-10-CM | POA: Diagnosis not present

## 2018-04-13 DIAGNOSIS — M1991 Primary osteoarthritis, unspecified site: Secondary | ICD-10-CM | POA: Diagnosis not present

## 2018-04-13 DIAGNOSIS — F419 Anxiety disorder, unspecified: Secondary | ICD-10-CM | POA: Diagnosis not present

## 2018-04-13 DIAGNOSIS — R278 Other lack of coordination: Secondary | ICD-10-CM | POA: Diagnosis not present

## 2018-04-13 DIAGNOSIS — F339 Major depressive disorder, recurrent, unspecified: Secondary | ICD-10-CM | POA: Diagnosis not present

## 2018-04-13 DIAGNOSIS — I10 Essential (primary) hypertension: Secondary | ICD-10-CM | POA: Diagnosis not present

## 2018-04-13 DIAGNOSIS — F0390 Unspecified dementia without behavioral disturbance: Secondary | ICD-10-CM | POA: Diagnosis not present

## 2018-04-13 DIAGNOSIS — R2689 Other abnormalities of gait and mobility: Secondary | ICD-10-CM | POA: Diagnosis not present

## 2018-04-14 DIAGNOSIS — M6281 Muscle weakness (generalized): Secondary | ICD-10-CM | POA: Diagnosis not present

## 2018-04-14 DIAGNOSIS — R278 Other lack of coordination: Secondary | ICD-10-CM | POA: Diagnosis not present

## 2018-04-14 DIAGNOSIS — M1991 Primary osteoarthritis, unspecified site: Secondary | ICD-10-CM | POA: Diagnosis not present

## 2018-04-14 DIAGNOSIS — R2689 Other abnormalities of gait and mobility: Secondary | ICD-10-CM | POA: Diagnosis not present

## 2018-04-14 DIAGNOSIS — I10 Essential (primary) hypertension: Secondary | ICD-10-CM | POA: Diagnosis not present

## 2018-04-15 DIAGNOSIS — R2689 Other abnormalities of gait and mobility: Secondary | ICD-10-CM | POA: Diagnosis not present

## 2018-04-15 DIAGNOSIS — R278 Other lack of coordination: Secondary | ICD-10-CM | POA: Diagnosis not present

## 2018-04-15 DIAGNOSIS — I10 Essential (primary) hypertension: Secondary | ICD-10-CM | POA: Diagnosis not present

## 2018-04-15 DIAGNOSIS — M6281 Muscle weakness (generalized): Secondary | ICD-10-CM | POA: Diagnosis not present

## 2018-04-15 DIAGNOSIS — F0150 Vascular dementia without behavioral disturbance: Secondary | ICD-10-CM | POA: Diagnosis not present

## 2018-04-15 DIAGNOSIS — M1991 Primary osteoarthritis, unspecified site: Secondary | ICD-10-CM | POA: Diagnosis not present

## 2018-04-17 DIAGNOSIS — M6281 Muscle weakness (generalized): Secondary | ICD-10-CM | POA: Diagnosis not present

## 2018-04-17 DIAGNOSIS — R2689 Other abnormalities of gait and mobility: Secondary | ICD-10-CM | POA: Diagnosis not present

## 2018-04-17 DIAGNOSIS — I10 Essential (primary) hypertension: Secondary | ICD-10-CM | POA: Diagnosis not present

## 2018-04-17 DIAGNOSIS — R278 Other lack of coordination: Secondary | ICD-10-CM | POA: Diagnosis not present

## 2018-04-17 DIAGNOSIS — M1991 Primary osteoarthritis, unspecified site: Secondary | ICD-10-CM | POA: Diagnosis not present

## 2018-04-20 DIAGNOSIS — I10 Essential (primary) hypertension: Secondary | ICD-10-CM | POA: Diagnosis not present

## 2018-04-20 DIAGNOSIS — R2689 Other abnormalities of gait and mobility: Secondary | ICD-10-CM | POA: Diagnosis not present

## 2018-04-20 DIAGNOSIS — R278 Other lack of coordination: Secondary | ICD-10-CM | POA: Diagnosis not present

## 2018-04-20 DIAGNOSIS — M6281 Muscle weakness (generalized): Secondary | ICD-10-CM | POA: Diagnosis not present

## 2018-04-20 DIAGNOSIS — M1991 Primary osteoarthritis, unspecified site: Secondary | ICD-10-CM | POA: Diagnosis not present

## 2018-04-21 DIAGNOSIS — B351 Tinea unguium: Secondary | ICD-10-CM | POA: Diagnosis not present

## 2018-04-21 DIAGNOSIS — R609 Edema, unspecified: Secondary | ICD-10-CM | POA: Diagnosis not present

## 2018-04-21 DIAGNOSIS — I739 Peripheral vascular disease, unspecified: Secondary | ICD-10-CM | POA: Diagnosis not present

## 2018-04-22 DIAGNOSIS — R2689 Other abnormalities of gait and mobility: Secondary | ICD-10-CM | POA: Diagnosis not present

## 2018-04-22 DIAGNOSIS — I10 Essential (primary) hypertension: Secondary | ICD-10-CM | POA: Diagnosis not present

## 2018-04-22 DIAGNOSIS — M1991 Primary osteoarthritis, unspecified site: Secondary | ICD-10-CM | POA: Diagnosis not present

## 2018-04-22 DIAGNOSIS — M6281 Muscle weakness (generalized): Secondary | ICD-10-CM | POA: Diagnosis not present

## 2018-04-22 DIAGNOSIS — R278 Other lack of coordination: Secondary | ICD-10-CM | POA: Diagnosis not present

## 2018-04-24 DIAGNOSIS — I10 Essential (primary) hypertension: Secondary | ICD-10-CM | POA: Diagnosis not present

## 2018-04-24 DIAGNOSIS — R278 Other lack of coordination: Secondary | ICD-10-CM | POA: Diagnosis not present

## 2018-04-24 DIAGNOSIS — M1991 Primary osteoarthritis, unspecified site: Secondary | ICD-10-CM | POA: Diagnosis not present

## 2018-04-24 DIAGNOSIS — M6281 Muscle weakness (generalized): Secondary | ICD-10-CM | POA: Diagnosis not present

## 2018-04-24 DIAGNOSIS — R2689 Other abnormalities of gait and mobility: Secondary | ICD-10-CM | POA: Diagnosis not present

## 2018-04-27 DIAGNOSIS — R278 Other lack of coordination: Secondary | ICD-10-CM | POA: Diagnosis not present

## 2018-04-27 DIAGNOSIS — F339 Major depressive disorder, recurrent, unspecified: Secondary | ICD-10-CM | POA: Diagnosis not present

## 2018-04-27 DIAGNOSIS — F0390 Unspecified dementia without behavioral disturbance: Secondary | ICD-10-CM | POA: Diagnosis not present

## 2018-04-27 DIAGNOSIS — I10 Essential (primary) hypertension: Secondary | ICD-10-CM | POA: Diagnosis not present

## 2018-04-27 DIAGNOSIS — M1991 Primary osteoarthritis, unspecified site: Secondary | ICD-10-CM | POA: Diagnosis not present

## 2018-04-27 DIAGNOSIS — R2689 Other abnormalities of gait and mobility: Secondary | ICD-10-CM | POA: Diagnosis not present

## 2018-04-27 DIAGNOSIS — I890 Lymphedema, not elsewhere classified: Secondary | ICD-10-CM | POA: Diagnosis not present

## 2018-04-27 DIAGNOSIS — N3281 Overactive bladder: Secondary | ICD-10-CM | POA: Diagnosis not present

## 2018-04-27 DIAGNOSIS — M6281 Muscle weakness (generalized): Secondary | ICD-10-CM | POA: Diagnosis not present

## 2018-04-28 DIAGNOSIS — I10 Essential (primary) hypertension: Secondary | ICD-10-CM | POA: Diagnosis not present

## 2018-04-28 DIAGNOSIS — M545 Low back pain: Secondary | ICD-10-CM | POA: Diagnosis not present

## 2018-04-28 DIAGNOSIS — M1991 Primary osteoarthritis, unspecified site: Secondary | ICD-10-CM | POA: Diagnosis not present

## 2018-04-28 DIAGNOSIS — M6281 Muscle weakness (generalized): Secondary | ICD-10-CM | POA: Diagnosis not present

## 2018-04-28 DIAGNOSIS — R269 Unspecified abnormalities of gait and mobility: Secondary | ICD-10-CM | POA: Diagnosis not present

## 2018-04-28 DIAGNOSIS — R2689 Other abnormalities of gait and mobility: Secondary | ICD-10-CM | POA: Diagnosis not present

## 2018-04-28 DIAGNOSIS — R278 Other lack of coordination: Secondary | ICD-10-CM | POA: Diagnosis not present

## 2018-04-28 DIAGNOSIS — J811 Chronic pulmonary edema: Secondary | ICD-10-CM | POA: Diagnosis not present

## 2018-04-29 DIAGNOSIS — M6281 Muscle weakness (generalized): Secondary | ICD-10-CM | POA: Diagnosis not present

## 2018-04-29 DIAGNOSIS — M1991 Primary osteoarthritis, unspecified site: Secondary | ICD-10-CM | POA: Diagnosis not present

## 2018-04-29 DIAGNOSIS — I10 Essential (primary) hypertension: Secondary | ICD-10-CM | POA: Diagnosis not present

## 2018-04-29 DIAGNOSIS — R278 Other lack of coordination: Secondary | ICD-10-CM | POA: Diagnosis not present

## 2018-04-29 DIAGNOSIS — R2689 Other abnormalities of gait and mobility: Secondary | ICD-10-CM | POA: Diagnosis not present

## 2018-05-02 DIAGNOSIS — M1991 Primary osteoarthritis, unspecified site: Secondary | ICD-10-CM | POA: Diagnosis not present

## 2018-05-02 DIAGNOSIS — R2689 Other abnormalities of gait and mobility: Secondary | ICD-10-CM | POA: Diagnosis not present

## 2018-05-02 DIAGNOSIS — M6281 Muscle weakness (generalized): Secondary | ICD-10-CM | POA: Diagnosis not present

## 2018-05-02 DIAGNOSIS — I10 Essential (primary) hypertension: Secondary | ICD-10-CM | POA: Diagnosis not present

## 2018-05-02 DIAGNOSIS — R278 Other lack of coordination: Secondary | ICD-10-CM | POA: Diagnosis not present

## 2018-05-08 DIAGNOSIS — F339 Major depressive disorder, recurrent, unspecified: Secondary | ICD-10-CM | POA: Diagnosis not present

## 2018-05-08 DIAGNOSIS — F0390 Unspecified dementia without behavioral disturbance: Secondary | ICD-10-CM | POA: Diagnosis not present

## 2018-05-08 DIAGNOSIS — F419 Anxiety disorder, unspecified: Secondary | ICD-10-CM | POA: Diagnosis not present

## 2018-05-12 ENCOUNTER — Emergency Department
Admission: EM | Admit: 2018-05-12 | Discharge: 2018-05-13 | Disposition: A | Discharge status: Home / Self Care | Payer: Medicare Other | Attending: Emergency Medicine | Admitting: Emergency Medicine

## 2018-05-12 ENCOUNTER — Other Ambulatory Visit: Payer: Self-pay

## 2018-05-12 ENCOUNTER — Emergency Department: Payer: Medicare Other

## 2018-05-12 DIAGNOSIS — I10 Essential (primary) hypertension: Secondary | ICD-10-CM | POA: Diagnosis not present

## 2018-05-12 DIAGNOSIS — I129 Hypertensive chronic kidney disease with stage 1 through stage 4 chronic kidney disease, or unspecified chronic kidney disease: Secondary | ICD-10-CM | POA: Insufficient documentation

## 2018-05-12 DIAGNOSIS — S299XXA Unspecified injury of thorax, initial encounter: Secondary | ICD-10-CM | POA: Diagnosis present

## 2018-05-12 DIAGNOSIS — M545 Low back pain: Secondary | ICD-10-CM | POA: Diagnosis not present

## 2018-05-12 DIAGNOSIS — Y999 Unspecified external cause status: Secondary | ICD-10-CM | POA: Insufficient documentation

## 2018-05-12 DIAGNOSIS — E039 Hypothyroidism, unspecified: Secondary | ICD-10-CM | POA: Insufficient documentation

## 2018-05-12 DIAGNOSIS — Y9301 Activity, walking, marching and hiking: Secondary | ICD-10-CM | POA: Insufficient documentation

## 2018-05-12 DIAGNOSIS — S22080A Wedge compression fracture of T11-T12 vertebra, initial encounter for closed fracture: Secondary | ICD-10-CM | POA: Insufficient documentation

## 2018-05-12 DIAGNOSIS — N183 Chronic kidney disease, stage 3 (moderate): Secondary | ICD-10-CM | POA: Diagnosis not present

## 2018-05-12 DIAGNOSIS — Z79899 Other long term (current) drug therapy: Secondary | ICD-10-CM | POA: Insufficient documentation

## 2018-05-12 DIAGNOSIS — R52 Pain, unspecified: Secondary | ICD-10-CM | POA: Diagnosis not present

## 2018-05-12 DIAGNOSIS — E1122 Type 2 diabetes mellitus with diabetic chronic kidney disease: Secondary | ICD-10-CM | POA: Diagnosis not present

## 2018-05-12 DIAGNOSIS — M5489 Other dorsalgia: Secondary | ICD-10-CM | POA: Diagnosis not present

## 2018-05-12 DIAGNOSIS — S3993XA Unspecified injury of pelvis, initial encounter: Secondary | ICD-10-CM | POA: Diagnosis not present

## 2018-05-12 DIAGNOSIS — Y92129 Unspecified place in nursing home as the place of occurrence of the external cause: Secondary | ICD-10-CM | POA: Diagnosis not present

## 2018-05-12 DIAGNOSIS — Z96653 Presence of artificial knee joint, bilateral: Secondary | ICD-10-CM | POA: Diagnosis not present

## 2018-05-12 DIAGNOSIS — S3992XA Unspecified injury of lower back, initial encounter: Secondary | ICD-10-CM | POA: Diagnosis not present

## 2018-05-12 DIAGNOSIS — R102 Pelvic and perineal pain: Secondary | ICD-10-CM | POA: Diagnosis not present

## 2018-05-12 DIAGNOSIS — W19XXXA Unspecified fall, initial encounter: Secondary | ICD-10-CM

## 2018-05-12 DIAGNOSIS — W0110XA Fall on same level from slipping, tripping and stumbling with subsequent striking against unspecified object, initial encounter: Secondary | ICD-10-CM | POA: Diagnosis not present

## 2018-05-12 MED ORDER — OLOPATADINE HCL 0.1 % OP SOLN
1.0000 [drp] | Freq: Two times a day (BID) | OPHTHALMIC | Status: DC
  Administered 2018-05-12 – 2018-05-13 (×2): 1 [drp] via OPHTHALMIC
  Filled 2018-05-12: qty 5

## 2018-05-12 MED ORDER — DONEPEZIL HCL 5 MG PO TABS
10.0000 mg | ORAL_TABLET | Freq: Two times a day (BID) | ORAL | Status: DC
  Administered 2018-05-12 – 2018-05-13 (×2): 10 mg via ORAL
  Filled 2018-05-12 (×2): qty 2

## 2018-05-12 MED ORDER — TRAMADOL HCL 50 MG PO TABS: ORAL_TABLET | ORAL | 0 refills | Status: DC

## 2018-05-12 MED ORDER — MIRABEGRON ER 50 MG PO TB24
50.0000 mg | ORAL_TABLET | Freq: Every day | ORAL | Status: DC
  Administered 2018-05-12 – 2018-05-13 (×2): 50 mg via ORAL
  Filled 2018-05-12 (×2): qty 1

## 2018-05-12 MED ORDER — TRAMADOL HCL 50 MG PO TABS
50.0000 mg | ORAL_TABLET | Freq: Once | ORAL | Status: AC
  Administered 2018-05-12: 50 mg via ORAL
  Filled 2018-05-12: qty 1

## 2018-05-12 MED ORDER — HYDROCHLOROTHIAZIDE 25 MG PO TABS
25.0000 mg | ORAL_TABLET | Freq: Every day | ORAL | Status: DC
  Administered 2018-05-12 – 2018-05-13 (×2): 25 mg via ORAL
  Filled 2018-05-12 (×2): qty 1

## 2018-05-12 MED ORDER — ACETAMINOPHEN 325 MG PO TABS: 650.0000 mg | ORAL_TABLET | Freq: Four times a day (QID) | ORAL | Status: DC | PRN

## 2018-05-12 MED ORDER — PRAVASTATIN SODIUM 10 MG PO TABS
10.0000 mg | ORAL_TABLET | Freq: Every day | ORAL | Status: DC
  Administered 2018-05-12: 10 mg via ORAL
  Filled 2018-05-12 (×2): qty 1

## 2018-05-12 MED ORDER — LISINOPRIL 10 MG PO TABS
20.0000 mg | ORAL_TABLET | Freq: Every day | ORAL | Status: DC
  Administered 2018-05-12 – 2018-05-13 (×2): 20 mg via ORAL
  Filled 2018-05-12 (×2): qty 2

## 2018-05-12 MED ORDER — BUSPIRONE HCL 5 MG PO TABS
10.0000 mg | ORAL_TABLET | Freq: Three times a day (TID) | ORAL | Status: DC
  Administered 2018-05-12 – 2018-05-13 (×2): 10 mg via ORAL
  Filled 2018-05-12 (×2): qty 2

## 2018-05-12 MED ORDER — SERTRALINE HCL 50 MG PO TABS
100.0000 mg | ORAL_TABLET | Freq: Every day | ORAL | Status: DC
  Administered 2018-05-12 – 2018-05-13 (×2): 100 mg via ORAL
  Filled 2018-05-12 (×2): qty 2

## 2018-05-12 MED ORDER — TRAMADOL HCL 50 MG PO TABS
50.0000 mg | ORAL_TABLET | Freq: Three times a day (TID) | ORAL | Status: DC | PRN
  Administered 2018-05-12: 50 mg via ORAL
  Filled 2018-05-12: qty 1

## 2018-05-12 NOTE — ED Notes (Signed)
PT in with pt  Pt is not able to take a few steps

## 2018-05-12 NOTE — ED Notes (Signed)
[facility] of the discharge instructions and need to fill the tramadol prescription coming with the patient.

## 2018-05-12 NOTE — ED Triage Notes (Signed)
[facility] with c/c of sacral pain and bilateral leg pain after falling backwards. Pt was walking to breakfast using her walker and slipped and fell backwards, landing on her bottom in a seated position. Fall was witnessed by facility staff. Pt denies hitting her head or any LoC. She states that she was able to get to the dining room with assistance but the pain increases as she ate and she was unable to stand after the meal. Facility staff then call EMS.

## 2018-05-12 NOTE — ED Notes (Signed)
Attempted to call PT, no answer will reattempt in 10-15min

## 2018-05-12 NOTE — ED Provider Notes (Signed)
[facility] Emergency Department Provider Note  ____________________________________________   First MD Initiated Contact with Patient 05/12/18 [phone]     (approximate)  I have reviewed the triage vital signs and the nursing notes.   HISTORY  Chief Complaint Tailbone Pain   HPI Sherry Brooks is a 84 y.o. female presents to the ED via EMS after mechanical fall at [facility] independent [facility].  Patient states she was walking to breath is using her walker and she slipped and fell backwards landing on her bottom in a seated position.  Fall was witnessed by staff at the facility.  Patient denies hitting her head or any loss of consciousness.  She was able to get to the dining room with assistance but had continued pain at which time the staff called EMS.  Currently patient complains of bilateral hips and low back pain.  She is status post back surgery and also has had knee surgery and 2018 by Dr. Ernest Pine.  Rates her pain as a 7 out of 10.  She is not taking any pain medication other than Tylenol.   Past Medical History:  Diagnosis Date  . Anxiety   . Arthritis    knees  . Borderline diabetic   . Complication of anesthesia    wakes up slowly, takes longer than usual to wake  . Depression   . Diabetes (HCC)   . GERD (gastroesophageal reflux disease)    on occas. uses TUMS for indigestion   . Heart murmur   . History of kidney stones   . History of stress test    10 yrs. ago, states it was normal , had been ordered for pt. due to stress related to husband's illness,, by Dr. Vic Ripper  . Hyperlipemia   . Hypertension   . Macular degeneration   . Peripheral vascular disease (HCC)    blood clots in legs mid 2000's   . Renal calculus    history of > 50 yrs. ago    Patient Active Problem List   Diagnosis Date Noted  . S/P total knee arthroplasty 03/24/2017  . UTI (urinary tract infection) 03/09/2017  . H/O diverticulitis of colon 07/29/2016   . Broken arm 07/29/2016  . Chronic venous insufficiency 04/22/2016  . Pain in limb 04/22/2016  . Abnormal finding on mammography 03/28/2015  . Adult BMI 30+ 03/28/2015  . Chronic kidney disease (CKD), stage III (moderate) (HCC) 03/28/2015  . Chronic LBP 03/28/2015  . Can't get food down 03/28/2015  . Lymphedema 03/28/2015  . Degeneration macular 03/28/2015  . Amnesia 03/28/2015  . Subclinical hypothyroidism 03/28/2015  . Hypercholesterolemia 03/28/2015  . Pain in the chest 02/14/2015  . SOB (shortness of breath) 02/14/2015  . Morbid obesity (HCC) 02/14/2015  . Unsteady gait 02/14/2015  . Primary osteoarthritis of both knees 02/14/2015  . Essential hypertension 02/14/2015  . Anxiety and depression 02/14/2015  . Joint pain 01/04/2014  . Bursitis, hip 10/19/2013  . Spondylolisthesis 10/28/2012  . Arthritis, degenerative 08/25/2009  . Type 2 diabetes mellitus (HCC) 07/03/2007  . Colon, diverticulosis 02/12/2001  . OP (osteoporosis) 02/12/2001    Past Surgical History:  Procedure Laterality Date  . APPENDECTOMY     as a teenager   . BIOPSY THYROID  06/2008  . COLON SURGERY     due to diverticulitis- had colostomy, then takedown   . EYE SURGERY     cataracts removed - /w IOL  . FRACTURE SURGERY     L wrist, plate  in place  . JOINT REPLACEMENT     both knees   . SPINE SURGERY  10/23/2012   Spinal Fusion, lower back  . TOTAL KNEE REVISION Left 03/24/2017   Procedure: TOTAL KNEE REVISION;  Surgeon: Donato Heinz, MD;  Location: [facility] ORS;  Service: Orthopedics;  Laterality: Left;    Prior to Admission medications   Medication Sig Start Date End Date Taking? Authorizing Provider  acetaminophen (TYLENOL) 650 MG CR tablet Take by mouth.    [provider]  ARTIFICIAL TEAR SOLUTION OP Apply 1 drop to eye daily.     [provider]  B Complex Vitamins (VITAMIN B COMPLEX PO) Take 1 tablet by mouth daily.     [provider]  donepezil (ARICEPT) 5 MG  tablet Take by mouth. 11/15/16   [provider]  lisinopril-hydrochlorothiazide (PRINZIDE,ZESTORETIC) 20-25 MG tablet Take by mouth. 02/01/13   [provider]  Multiple Minerals-Vitamins (CALCIUM-MAGNESIUM-ZINC-D3 PO) Take 1 tablet by mouth daily.    [provider]  Multiple Vitamins-Minerals (PRESERVISION AREDS) CAPS Take 2 capsules by mouth daily.     [provider]  Olopatadine HCl (PATADAY) 0.2 % SOLN Apply to eye.    [provider]  oxyCODONE (OXY IR/ROXICODONE) 5 MG immediate release tablet Take by mouth. 03/25/17   [provider]  pravastatin (PRAVACHOL) 10 MG tablet TAKE 1 TABLET BY MOUTH EVERY NIGHT AT BEDTIME 12/24/16   [provider]  Psyllium (METAMUCIL PO) Take 6-7 capsules by mouth daily.     [provider]  sertraline (ZOLOFT) 50 MG tablet Take by mouth. 11/15/16   [provider]  traMADol (ULTRAM) 50 MG tablet 1 tablet every 6-8 hours prn moderate pain 05/12/18   Bridget Hartshorn L, PA-C  vitamin E 400 UNIT capsule Take 400 Units by mouth daily.     [provider]    Allergies Patient has no known allergies.  Family History  Problem Relation Age of Onset  . Arthritis Mother   . Arthritis Father   . Bladder Cancer Neg Hx   . Kidney cancer Neg Hx   . Prostate cancer Neg Hx     Social History Social History   Tobacco Use  . Smoking status: Never Smoker  . Smokeless tobacco: Never Used  Substance Use Topics  . Alcohol use: No    Comment: glass of wine, rare occas.   . Drug use: No    Review of Systems Constitutional: No fever/chills Eyes: No visual changes. ENT: No trauma. Cardiovascular: Denies chest pain. Respiratory: Denies shortness of breath. Gastrointestinal: No abdominal pain.  No nausea, no vomiting. Genitourinary: Negative for dysuria. Musculoskeletal: Positive for bilateral hip pain and low back pain. Skin: Negative for rash. Neurological: Negative for  headaches, focal weakness or numbness. ___________________________________________   PHYSICAL EXAM:  VITAL SIGNS: ED Triage Vitals  Enc Vitals Group     BP 05/12/18 0832 (!) 166/80     Pulse Rate 05/12/18 0832 (!) 50     Resp 05/12/18 0832 18     Temp 05/12/18 0832 97.8 F (36.6 C)     Temp Source 05/12/18 0832 Oral     SpO2 05/12/18 0832 98 %     Weight 05/12/18 0840 184 lb (83.5 kg)     Height 05/12/18 0840 5\' 4"  (1.626 m)     Head Circumference --      Peak Flow --      Pain Score 05/12/18 0839 7  Pain Loc --      Pain Edu? --      Excl. in GC? --    Constitutional: Alert and oriented. Well appearing and in no acute distress.  Patient is cooperative and answers questions appropriately. Eyes: Conjunctivae are normal.  Head: Atraumatic. Nose: No trauma. Neck: No stridor.  No cervical tenderness on palpation posteriorly. Cardiovascular: Normal rate, regular rhythm. Grossly normal heart sounds.  Good peripheral circulation. Respiratory: Normal respiratory effort.  No retractions. Lungs CTAB. Gastrointestinal: Soft and nontender. No distention.  No bruising is present and bowel sounds are normoactive. Musculoskeletal: Patient is able to move upper extremities without any difficulty and no evidence of injury is present.  Nontender to palpation.  Nontender anterior chest wall to palpation and no abrasions or present.  Patient does however have tenderness and discomfort with compression of the hips and pelvis bilaterally.  Patient has some diffuse tenderness on palpation of bilateral knees which is secondary to her past surgery. Patient was able to sit up with assistance and had tenderness to palpation of lower thoracic and lumbar spine.  No soft tissue edema or abrasions were present.  No discoloration.  Gait was not tested secondary to patient's discomfort and also she is dependent on a walker which is not present with her. Neurologic:  Normal speech and language. No gross focal  neurologic deficits are appreciated. Skin:  Skin is warm, dry and intact.  No abrasions or ecchymosis is present. Psychiatric: Mood and affect are normal. Speech and behavior are normal.  ____________________________________________   LABS (all labs ordered are listed, but only abnormal results are displayed)  Labs Reviewed - No data to display  RADIOLOGY   Official radiology report(s): Dg Lumbar Spine 2-3 Views  Result Date: 05/12/2018 CLINICAL DATA:  Low back pain since a fall this morning. Initial encounter. EXAM: LUMBAR SPINE - 2-3 VIEW COMPARISON:  CT lumbar spine 02/01/2018. FINDINGS: The patient has a mild, acute fracture of the superior endplate of T12 with vertebral body height loss of approximately 10%. No bony retropulsion or involvement of the posterior elements is seen. No other fracture is identified. The patient is status post L4-5 fusion. Trace anterolisthesis L4 on L5 is unchanged. Scattered loss of disc space height is noted. Extensive atherosclerosis is seen. IMPRESSION: Acute, mild superior endplate compression fracture T12 without bony retropulsion or involvement of the posterior elements. Status post L4-5 fusion. Atherosclerosis. Electronically Signed   By: Drusilla Kanner M.D.   On: 05/12/2018 10:08   Dg Pelvis 1-2 Views  Result Date: 05/12/2018 CLINICAL DATA:  Fall today. Pain. EXAM: PELVIS - 1-2 VIEW COMPARISON:  03/09/2017 FINDINGS: No acute pelvic fracture or diastasis is identified. Moderate to severe hip joint space narrowing and marginal osteophyte formation, right greater than left, are similar to the prior study. Pelvic surgical clips and lower lumbar fusion are noted. IMPRESSION: Right greater than left hip osteoarthrosis without acute osseous abnormality identified. Electronically Signed   By: Sebastian Ache M.D.   On: 05/12/2018 10:08    ____________________________________________   PROCEDURES  Procedure(s) performed: None  Procedures  Critical  Care performed: No  ____________________________________________   INITIAL IMPRESSION / ASSESSMENT AND PLAN / ED COURSE  As part of my medical decision making, I reviewed the following data within the electronic MEDICAL RECORD NUMBER Notes from prior ED visits and [place] Controlled Substance Database  Patient presents to the ED via EMS after she fell at [facility] with her walker to eat breakfast.  She  denies any head injury or loss of consciousness and staff members were present and witnessed her fall.  Patient landed on her buttocks landing backwards.  She complains of pain to her mid and lower back.  She is also having some discomfort bilateral hips.  Patient is status post lumbar fusion.  She also reports that she has had a compression fracture in the past.  X-ray reports that there is a new compression fracture at T12.  Patient was made aware.  Patient only required 1 tramadol and pain was managed.  [facility] was notified and ER was told that they could not take patient unless she was ambulatory.  PT was consulted and felt that she could not meet criteria.  Patient's family was undated.  Social consult will arrange placement and patient will remain in ED until tomorrow.    ____________________________________________   FINAL CLINICAL IMPRESSION(S) / ED DIAGNOSES  Final diagnoses:  Compression fracture of T12 vertebra, initial encounter (HCC)  Fall, initial encounter     ED Discharge Orders         Ordered    traMADol (ULTRAM) 50 MG tablet     05/12/18 1118           Note:  This document was prepared using Dragon voice recognition software and may include unintentional dictation errors.    Tommi RumpsSummers, Rhonda L, PA-C 05/13/18 1431    Emily FilbertWilliams, Jonathan E, MD 05/13/18 1501

## 2018-05-12 NOTE — ED Notes (Signed)
Pt is resting in bed. Respirations even/unlabored. nadn 

## 2018-05-12 NOTE — ED Provider Notes (Signed)
-----------------------------------------   3:38 PM on 05/12/2018 -----------------------------------------  Patient is being seen by physician assistant Bridget Hartshornhonda Summers, it appears the patient had a fall with a mild T12 compression fracture/endplate fracture.  Patient has been seen by physical therapy, patient will require a higher level of care than is available at [facility].  We will have social work see the patient and attempt to place into a short-term rehab.   Minna AntisPaduchowski, Kwamane Whack, MD 05/12/18 2358

## 2018-05-12 NOTE — Clinical Social Work Note (Addendum)
CSW was informed that [facility] is from [facility], and patient was evaluated by PT and they are recommending SNF for short term rehab.  CSW spoke to patient and her daughter in law [name] [phone] and they are in agreement to going to SNF.  Patient and daughter in law would like to go to [facility] if possible.  CSW contacted [facility] and they will review patient.  Patient will need insurance authorization, CSW to begin bed search and insurance authorization process.  CSW to continue to follow patient's progress throughout discharge planning.  5:30pm  CSW presented bed offer to patient and her daughter in law [name] via phone [phone], and they chose [facility].  CSW left message with [facility], they can accept patient once insurance has been approved.  CSW continuing to follow patient's progress throughout discharge planning.  Windell MouldingEric Amed Datta, MSW, [facility] ED Covering CSW [phone] 05/12/2018 4:28 PM

## 2018-05-12 NOTE — Discharge Instructions (Addendum)
Follow-up with your primary care provider or Dr. Ernest PineHooten who is your orthopedist.  You have a compression fracture of your thoracic vertebra #12.  You may experience pain with this as you have in the past with previous compression fractures.  A prescription for tramadol was written for you to take every 6 to 8 hours for moderate pain.  Be aware that this medication could cause drowsiness and increase your risk for falling.  Do not walk without your walker or with assistance.

## 2018-05-12 NOTE — ED Notes (Signed)
Eric,social worker< is in with pt to evaluate  Also has spoken to family

## 2018-05-12 NOTE — Evaluation (Signed)
Physical Therapy Evaluation Patient Details Name: Sherry Brooks MRN: [id number] DOB: 1929/03/06 Today's Date: 05/12/2018   History of Present Illness  84 year old female admitted from [facility] assisted [facility] after fall. Patient landed on bottom and per RN suffered lumbar compression fracture.     Clinical Impression  Patient agreeable to attempt OOB activity. Patient reports moderate pain with all movement. Patient requires mod assist for rolling in bed, and performing supine>sidelying>sitting. Able to perform standing with min assist using rolling walker for support and able to take a couple of forward steps and then 5 side steps up toward the head of the bed. Patient is pain limited and is unable to return to assisted living at this time. Patient will benefit from skilled PT to address her functional limitations and improve pain and ambulation.      Follow Up Recommendations SNF    Equipment Recommendations       Recommendations for Other Services       Precautions / Restrictions Precautions Precautions: Fall Restrictions Weight Bearing Restrictions: No      Mobility  Bed Mobility Overal bed mobility: Needs Assistance Bed Mobility: Rolling;Sidelying to Sit;Sit to Sidelying Rolling: Mod assist Sidelying to sit: Mod assist     Sit to sidelying: Mod assist General bed mobility comments: patient limited by pain, requires mod assist with all bed mobility. Instruction in log rolling.   Transfers Overall transfer level: Modified independent Equipment used: Rolling walker (2 wheeled)                Ambulation/Gait Ambulation/Gait assistance: Editor, commissioning (Feet): 3 Feet Assistive device: Rolling walker (2 wheeled) Gait Pattern/deviations: Step-to pattern;Decreased step length - left;Decreased step length - right;Shuffle Gait velocity: decreased severely   General Gait Details: pain limited  Stairs            Wheelchair Mobility     Modified Rankin (Stroke Patients Only)       Balance Overall balance assessment: Mild deficits observed, not formally tested                                           Pertinent Vitals/Pain Pain Assessment: 0-10 Pain Score: 6  Pain Descriptors / Indicators: Aching;Guarding;Grimacing;Constant Pain Intervention(s): Limited activity within patient's tolerance;Monitored during session    Home Living Family/patient expects to be discharged to:: Skilled nursing facility                 Additional Comments: pt is a resident at [facility]    Prior Function Level of Independence: Independent with assistive device(s)   Gait / Transfers Assistance Needed: patient was independent with transfers and gait for short distances  ADL's / Homemaking Assistance Needed: required assist with some ADLs and was receiving PT at home  Comments: Pt generally has been able to walk to meals 3x/day with rollator but does need assist with most ADLs     Hand Dominance   Dominant Hand: Right    Extremity/Trunk Assessment   Upper Extremity Assessment Upper Extremity Assessment: Overall WFL for tasks assessed    Lower Extremity Assessment Lower Extremity Assessment: Overall WFL for tasks assessed       Communication   Communication: No difficulties  Cognition Arousal/Alertness: Awake/alert Behavior During Therapy: WFL for tasks assessed/performed Overall Cognitive Status: Within Functional Limits for tasks assessed  General Comments General comments (skin integrity, edema, etc.): posterior leaning with initial standing    Exercises     Assessment/Plan    PT Assessment Patient needs continued PT services  PT Problem List Decreased activity tolerance;Decreased mobility;Pain;Decreased balance       PT Treatment Interventions Gait training;Balance training;Functional mobility training;Therapeutic  activities;Therapeutic exercise;Patient/family education    PT Goals (Current goals can be found in the Care Plan section)  Acute Rehab PT Goals Patient Stated Goal: [facility] PT Goal Formulation: With patient Time For Goal Achievement: 05/26/18 Potential to Achieve Goals: Good    Frequency 7X/week   Barriers to discharge Decreased caregiver support      Co-evaluation               AM-PAC PT "6 Clicks" Mobility  Outcome Measure Help needed turning from your back to your side while in a flat bed without using bedrails?: A Lot Help needed moving from lying on your back to sitting on the side of a flat bed without using bedrails?: A Lot Help needed moving to and from a bed to a chair (including a wheelchair)?: A Lot Help needed standing up from a chair using your arms (e.g., wheelchair or bedside chair)?: A Lot Help needed to walk in hospital room?: A Lot Help needed climbing 3-5 steps with a railing? : Total 6 Click Score: 11    End of Session Equipment Utilized During Treatment: Gait belt Activity Tolerance: Patient limited by pain Patient left: in bed Nurse Communication: Mobility status PT Visit Diagnosis: Other abnormalities of gait and mobility (R26.89);Unsteadiness on feet (R26.81);Muscle weakness (generalized) (M62.81);Pain    Time: 1500-1530 PT Time Calculation (min) (ACUTE ONLY): 30 min   Charges:   PT Evaluation $PT Eval Moderate Complexity: 1 Mod PT Treatments $Gait Training: 8-22 mins $Therapeutic Activity: 8-22 mins        Charlottie Peragine, PT, GCS 05/12/18,3:52 PM

## 2018-05-12 NOTE — ED Notes (Signed)
Daughter is here with pt  States she has had several falls over the past couple of months

## 2018-05-12 NOTE — NC FL2 (Signed)
[place] MEDICAID FL2 LEVEL OF CARE SCREENING TOOL     IDENTIFICATION  Patient Name: Sherry Brooks Birthdate: 1929/03/08 Sex: female Admission Date (Current Location): 05/12/2018  [place] and [place] Number:  Chiropodist and Address:  [facility], [address] [place], [place] [address]      Provider Number: [id number]  Attending Physician Name and Address:  Emily Filbert, MD  Relative Name and Phone Number:  Carolin, Quang Relative [phone] or Dietrich,Elke Friend [phone]     Current Level of Care: Hospital Recommended Level of Care: Skilled Nursing Facility Prior Approval Number:    Date Approved/Denied:   PASRR Number: [id number]  Discharge Plan: SNF    Current Diagnoses: Patient Active Problem List   Diagnosis Date Noted  . S/P total knee arthroplasty 03/24/2017  . UTI (urinary tract infection) 03/09/2017  . H/O diverticulitis of colon 07/29/2016  . Broken arm 07/29/2016  . Chronic venous insufficiency 04/22/2016  . Pain in limb 04/22/2016  . Abnormal finding on mammography 03/28/2015  . Adult BMI 30+ 03/28/2015  . Chronic kidney disease (CKD), stage III (moderate) (HCC) 03/28/2015  . Chronic LBP 03/28/2015  . Can't get food down 03/28/2015  . Lymphedema 03/28/2015  . Degeneration macular 03/28/2015  . Amnesia 03/28/2015  . Subclinical hypothyroidism 03/28/2015  . Hypercholesterolemia 03/28/2015  . Pain in the chest 02/14/2015  . SOB (shortness of breath) 02/14/2015  . Morbid obesity (HCC) 02/14/2015  . Unsteady gait 02/14/2015  . Primary osteoarthritis of both knees 02/14/2015  . Essential hypertension 02/14/2015  . Anxiety and depression 02/14/2015  . Joint pain 01/04/2014  . Bursitis, hip 10/19/2013  . Spondylolisthesis 10/28/2012  . Arthritis, degenerative 08/25/2009  . Type 2 diabetes mellitus (HCC) 07/03/2007  . Colon, diverticulosis 02/12/2001  . OP (osteoporosis) 02/12/2001     Orientation RESPIRATION BLADDER Height & Weight     Self, Time, Situation, Place  Normal Continent Weight: 184 lb (83.5 kg) Height:  5\' 4"  (162.6 cm)  BEHAVIORAL SYMPTOMS/MOOD NEUROLOGICAL BOWEL NUTRITION STATUS      Continent Diet(Low sodium diet)  AMBULATORY STATUS COMMUNICATION OF NEEDS Skin   Limited Assist Verbally Normal                       Personal Care Assistance Level of Assistance  Bathing, Feeding, Dressing Bathing Assistance: Limited assistance Feeding assistance: Independent Dressing Assistance: Limited assistance     Functional Limitations Info  Sight, Hearing, Speech Sight Info: Adequate Hearing Info: Adequate Speech Info: Adequate    SPECIAL CARE FACTORS FREQUENCY  PT (By licensed PT), OT (By licensed OT)     PT Frequency: 5x a week OT Frequency: 5x a week            Contractures Contractures Info: Not present    Additional Factors Info  Psychotropic, Code Status, Allergies Code Status Info: Full Code Allergies Info: NKA Psychotropic Info: sertraline (ZOLOFT) 50 MG tablet         Current Medications (05/12/2018):  This is the current hospital active medication list No current facility-administered medications for this encounter.    Current Outpatient Medications  Medication Sig Dispense Refill  . acetaminophen (TYLENOL) 650 MG CR tablet Take by mouth.    . ARTIFICIAL TEAR SOLUTION OP Apply 1 drop to eye daily.     . B Complex Vitamins (VITAMIN B COMPLEX PO) Take 1 tablet by mouth daily.     Marland Kitchen donepezil (ARICEPT) 5 MG tablet Take by mouth.    Marland Kitchen  lisinopril-hydrochlorothiazide (PRINZIDE,ZESTORETIC) 20-25 MG tablet Take by mouth.    . Multiple Minerals-Vitamins (CALCIUM-MAGNESIUM-ZINC-D3 PO) Take 1 tablet by mouth daily.    . Multiple Vitamins-Minerals (PRESERVISION AREDS) CAPS Take 2 capsules by mouth daily.     . Olopatadine HCl (PATADAY) 0.2 % SOLN Apply to eye.    Marland Kitchen. oxyCODONE (OXY IR/ROXICODONE) 5 MG immediate release tablet Take  by mouth.    . pravastatin (PRAVACHOL) 10 MG tablet TAKE 1 TABLET BY MOUTH EVERY NIGHT AT BEDTIME    . Psyllium (METAMUCIL PO) Take 6-7 capsules by mouth daily.     . sertraline (ZOLOFT) 50 MG tablet Take by mouth.    . traMADol (ULTRAM) 50 MG tablet 1 tablet every 6-8 hours prn moderate pain 15 tablet 0  . vitamin E 400 UNIT capsule Take 400 Units by mouth daily.        Discharge Medications: Please see discharge summary for a list of discharge medications.  Relevant Imaging Results:  Relevant Lab Results:   Additional Information SSN [id number]  Darleene Cleavernterhaus, Chrisoula Zegarra R, [place]WA

## 2018-05-12 NOTE — ED Notes (Signed)
Contacted Eric with social work about placement in SNF. He was aware of the consult and will start the process for placement but needs to wait on an authorization number which will most likely not be available until tomorrow.

## 2018-05-12 NOTE — Clinical Social Work Note (Signed)
Clinical Social Work Assessment  Patient Details  Name: Sherry Brooks MRN: [id number] Date of Birth: 30-Oct-1928  Date of referral:  05/12/18               Reason for consult:  Facility Placement                Permission sought to share information with:  Family Supports, Customer service manager Permission granted to share information::  Yes, Verbal Permission Granted  Name::     Kinzlee, Selvy Relative [phone] or Corinne Ports [phone]   Agency::  SNF admissions  Relationship::     Contact Information:     Housing/Transportation Living arrangements for the past 2 months:  [place] of Information:  Patient, Other (Comment Required)(Patient's daughter in law) Patient Interpreter Needed:  None Criminal Activity/Legal Involvement Pertinent to Current Situation/Hospitalization:  No - Comment as needed Significant Relationships:  Other Family Members Lives with:  Facility Resident Do you feel safe going back to the place where you live?  No Need for family participation in patient care:  Yes (Comment)  Care giving concerns:  Patient and family feel she needs some short term rehab before she is able to return back to ALF.  Patient and family and considering having her placed at SNF as a long term care patient after rehab.   Social Worker assessment / plan:  Patient is an 84 year old female who is alert and oriented x4.  Patient is a resident at [facility] and has been there since April, patient expressed that she has enjoyed being there.  Patient stated that she had a fall and wants to go to SNF for short term rehab.  CSW met with patient and explained role of CSW and process for looking for placement.  CSW explained how insurance will pay for stay at [facility] once insurance authorization has been received.  Patient stated that she was at [facility] in the past and she would like to return if possible.  CSW informed her  that this CSW will try to get her placed there but it will depend on availability and insurance auth.  Patient requested that CSW speak to her daughter in law as well to discuss SNF placement.  CSW contacted patient's daughter in law Butch Penny, and she is in agreement to having patient return to [facility].  Patient's daughter in law stated that they are considering long term placement under private pay after rehab, CSW explained to daughter in law that the social worker at the SNF can assist her with this.  Patient and daughter in law gave CSW permission to begin bed search and insurance authorization.  Patient and daughter in law did not have any other questions.   Employment status:  Retired Nurse, adult PT Recommendations:  [place] / Referral to community resources:  [place]  Patient/Family's Response to care:  Patient and family are in agreement to going to SNF for short term rehab.  Patient/Family's Understanding of and Emotional Response to Diagnosis, Current Treatment, and Prognosis:  Patient and family feel that she will need to transition to long term care at SNF after rehab, due to patient having difficulty with ADLs now.  Patient and family are going to discuss this option with SNF once she is ready for discharge.  Emotional Assessment Appearance:  Appears stated age Attitude/Demeanor/Rapport:    Affect (typically observed):  Appropriate, Calm, Pleasant, Stable Orientation:  Oriented  to Self, Oriented to Place, Oriented to  Time, Oriented to Situation Alcohol / Substance use:  Not Applicable Psych involvement (Current and /or in the community):  No (Comment)  Discharge Needs  Concerns to be addressed:  Lack of Support, Care Coordination Readmission within the last 30 days:  No Current discharge risk:  Lack of support system Barriers to Discharge:  Continued Medical Work up, [employer]   Anell Barr 05/12/2018, 4:41 PM

## 2018-05-13 DIAGNOSIS — F331 Major depressive disorder, recurrent, moderate: Secondary | ICD-10-CM | POA: Diagnosis not present

## 2018-05-13 DIAGNOSIS — G301 Alzheimer's disease with late onset: Secondary | ICD-10-CM | POA: Diagnosis not present

## 2018-05-13 DIAGNOSIS — S22080D Wedge compression fracture of T11-T12 vertebra, subsequent encounter for fracture with routine healing: Secondary | ICD-10-CM | POA: Diagnosis not present

## 2018-05-13 DIAGNOSIS — Z4789 Encounter for other orthopedic aftercare: Secondary | ICD-10-CM | POA: Diagnosis not present

## 2018-05-13 DIAGNOSIS — F419 Anxiety disorder, unspecified: Secondary | ICD-10-CM | POA: Diagnosis not present

## 2018-05-13 DIAGNOSIS — J811 Chronic pulmonary edema: Secondary | ICD-10-CM | POA: Diagnosis not present

## 2018-05-13 DIAGNOSIS — R278 Other lack of coordination: Secondary | ICD-10-CM | POA: Diagnosis not present

## 2018-05-13 DIAGNOSIS — Z7401 Bed confinement status: Secondary | ICD-10-CM | POA: Diagnosis not present

## 2018-05-13 DIAGNOSIS — S22080A Wedge compression fracture of T11-T12 vertebra, initial encounter for closed fracture: Secondary | ICD-10-CM | POA: Diagnosis not present

## 2018-05-13 DIAGNOSIS — M545 Low back pain: Secondary | ICD-10-CM | POA: Diagnosis not present

## 2018-05-13 DIAGNOSIS — R52 Pain, unspecified: Secondary | ICD-10-CM | POA: Diagnosis not present

## 2018-05-13 DIAGNOSIS — R269 Unspecified abnormalities of gait and mobility: Secondary | ICD-10-CM | POA: Diagnosis not present

## 2018-05-13 DIAGNOSIS — R404 Transient alteration of awareness: Secondary | ICD-10-CM | POA: Diagnosis not present

## 2018-05-13 DIAGNOSIS — M2550 Pain in unspecified joint: Secondary | ICD-10-CM | POA: Diagnosis not present

## 2018-05-13 DIAGNOSIS — Z96659 Presence of unspecified artificial knee joint: Secondary | ICD-10-CM | POA: Diagnosis not present

## 2018-05-13 NOTE — ED Notes (Signed)
Pt is resting in bed, appears to be asleep. Chest rising and falling respirations even/unlabored. nadn

## 2018-05-13 NOTE — ED Notes (Signed)
Pt stated that she wet herself. Pt is cleaned and a new adult brief is applied at this time.

## 2018-05-13 NOTE — ED Notes (Signed)
Daughter in law notified that pt went to [facility] at this time.

## 2018-05-13 NOTE — Progress Notes (Signed)
Occupational Therapy Evaluation Patient Details Name: Sherry Brooks MRN: [id number] DOB: Mar 31, 1929 Today's Date: 05/13/2018    History of Present Illness 84 year old female admitted from [facility] after fall. Patient landed on bottom and per RN suffered lumbar compression fracture.    Clinical Impression   Pt seen for OT evaluation this date. Prior to hospital admission, pt was using a RW for mobility and required assistance with most ADL tasks. Pt presented supine in bed and was limited by pain. Pt has macular degeneration and described her vision as "foggy"  and "like a film over my eyes". Questionable impairment with depth perception. Pt states her balance is poor and she is very fearful of falling.  Currently pt demonstrates impairments in (see OT Problem list below)  requiring MAX A  assist for LB dressing/bathing tasks and MOD A for UB tasks. Pt would benefit from skilled OT to address noted impairments and functional limitations  in order to maximize safety and independence while minimizing falls risk and caregiver burden. Upon hospital discharge, recommend pt discharge to SNF.    Follow Up Recommendations  SNF    Equipment Recommendations  None recommended by OT    Recommendations for Other Services       Precautions / Restrictions Precautions Precautions: Fall Precaution Comments: Pt very afraid of falling Restrictions Weight Bearing Restrictions: No      Mobility Bed Mobility Overal bed mobility: Needs Assistance Bed Mobility: Sit to Sidelying;Sidelying to Sit Rolling: Min assist;Mod assist;+2 for physical assistance Sidelying to sit: Min assist;Mod assist;+2 for physical assistance     Sit to sidelying: Min assist;Mod assist;+2 for physical assistance General bed mobility comments: Pt instructed in log rolling; limited by pain  Transfers Overall transfer level: Needs assistance Equipment used: 2 person hand held assist Transfers:  Sit to/from Stand(from elevated surface) Sit to Stand: Min assist              Balance Overall balance assessment: History of Falls;Needs assistance Sitting-balance support: Feet supported;Bilateral upper extremity supported Sitting balance-Leahy Scale: Fair     Standing balance support: Bilateral upper extremity supported Standing balance-Leahy Scale: Poor                             ADL either performed or assessed with clinical judgement   ADL Overall ADL's : Needs assistance/impaired Eating/Feeding: Bed level;Minimal assistance   Grooming: Sitting;Minimal assistance   Upper Body Bathing: Sitting;Moderate assistance   Lower Body Bathing: Maximal assistance;Sitting/lateral leans;+2 for physical assistance   Upper Body Dressing : Moderate assistance;Sitting   Lower Body Dressing: Maximal assistance;Sitting/lateral leans;+2 for physical assistance                       Vision Baseline Vision/History: Macular Degeneration(pt states her vision is filmy or foggy. Unable to see clearly.)       Perception     Praxis      Pertinent Vitals/Pain Pain Assessment: Faces Faces Pain Scale: Hurts even more Pain Location: L hip and L arm Pain Descriptors / Indicators: Aching;Grimacing Pain Intervention(s): Limited activity within patient's tolerance;Monitored during session;Repositioned     Hand Dominance Right   Extremity/Trunk Assessment Upper Extremity Assessment Upper Extremity Assessment: Overall WFL for tasks assessed   Lower Extremity Assessment Lower Extremity Assessment: Generalized weakness;LLE deficits/detail LLE Deficits / Details: L hip limited by pain       Communication Communication Communication: No difficulties  Cognition Arousal/Alertness: Awake/alert Behavior During Therapy: WFL for tasks assessed/performed Overall Cognitive Status: Within Functional Limits for tasks assessed                                      General Comments  posterior lean upon standing; VC provided for upright posture    Exercises Other Exercises Other Exercises: Pt educated in functional bed mobility technique including log rolling Other Exercises: Pt educated in AE to use with eating task   Shoulder Instructions      Home Living Family/patient expects to be discharged to:: Skilled nursing facility                                 Additional Comments: [facility]      Prior Functioning/Environment Level of Independence: Independent with assistive device(s)    ADL's / Homemaking Assistance Needed: Pt received assistance for dressing and showering. Had difficulty with self feeding;   Comments: Pt generally has been able to walk to meals 3x/day with rollator. Used rollator for ambulation.        OT Problem List: Decreased strength;Decreased knowledge of use of DME or AE;Impaired vision/perception;Impaired balance (sitting and/or standing);Decreased safety awareness;Pain      OT Treatment/Interventions: Self-care/ADL training;Balance training;Therapeutic exercise;Therapeutic activities;Visual/perceptual remediation/compensation;Patient/family education;DME and/or AE instruction    OT Goals(Current goals can be found in the care plan section) Acute Rehab OT Goals Patient Stated Goal: to go to [facility] OT Goal Formulation: With patient Time For Goal Achievement: 05/27/18 Potential to Achieve Goals: Good ADL Goals Pt Will Perform Eating: with set-up;with adaptive utensils;sitting;with supervision Pt Will Perform Grooming: sitting;with set-up;with modified independence Additional ADL Goal #1: Pt will independently utilize at least 2 falls prevention strategies when completing ADL task.  OT Frequency: Min 1X/week   Barriers to D/C:            Co-evaluation              AM-PAC OT "6 Clicks" Daily Activity     Outcome Measure Help from another person  eating meals?: A Little Help from another person taking care of personal grooming?: A Little Help from another person toileting, which includes using toliet, bedpan, or urinal?: A Lot Help from another person bathing (including washing, rinsing, drying)?: A Lot Help from another person to put on and taking off regular upper body clothing?: A Little Help from another person to put on and taking off regular lower body clothing?: A Lot 6 Click Score: 15   End of Session    Activity Tolerance: Patient limited by pain Patient left: in bed;with call bell/phone within reach  OT Visit Diagnosis: Repeated falls (R29.6);Other abnormalities of gait and mobility (R26.89);Muscle weakness (generalized) (M62.81);Pain;Low vision, both eyes (H54.2);Unsteadiness on feet (R26.81) Pain - Right/Left: Left Pain - part of body: Hip                Time: [id number] OT Time Calculation (min): 37 min Charges:     Gypsy BalsamMarisa Bryton Romagnoli OTS  05/13/2018, 9:02 AM

## 2018-05-13 NOTE — ED Notes (Signed)
Pt is resting in bed. Nadn. 

## 2018-05-13 NOTE — Clinical Social Work Note (Addendum)
CSW received phone call from insurance company, they have approved patient to go to [facility].  CSW contacted [facility] and they can accept patient today.  CSW updated physician and bedside nurse.  [facility] to [facility] room D13.  Patient and family agreeable to plans will transport via ems RN to call report to [phone].  CSW updated patient's daughter in law [name], [phone].  Windell MouldingEric Travarus Trudo, MSW, [facility] ED Covering CSW [phone] 05/13/2018 11:49 AM

## 2018-05-13 NOTE — Clinical Social Work Note (Signed)
CSW spoke to [employer] company, they are still reviewing patient for sn[facility] placement [facility].  Insurance company will call CSW once approval has been received.  Windell MouldingEric Alexza Norbeck, MSW, [facility] ED Covering CSW [phone] 05/13/2018 9:38 AM

## 2018-05-13 NOTE — ED Provider Notes (Signed)
  Physical Exam  BP (!) 157/74   Pulse 77   Temp 98.6 F (37 C) (Oral)   Resp 16   Ht 5\' 4"  (1.626 m)   Wt 83.5 kg   SpO2 94%   BMI 31.58 kg/m   Physical Exam  ED Course/Procedures     Procedures  MDM  Patient pending nursing home placement. Social worker able to find placement. EMS to transport    Charlynne PanderYao, David Hsienta, MD 05/13/18 1140

## 2018-05-13 NOTE — ED Notes (Signed)
Pts brief changed at this time.

## 2018-05-13 NOTE — NC FL2 (Signed)
[place] MEDICAID FL2 LEVEL OF CARE SCREENING TOOL     IDENTIFICATION  Patient Name: Sherry Brooks Birthdate: Apr 25, 1929 Sex: female Admission Date (Current Location): 05/12/2018  [place]   Facility and Address:  [facility], [address], [place], [place] [address]      Provider Number: [id number]  Attending Physician Name and Address:  Richardean CanalYAO, DAVID H, MD  Relative Name and Phone Number:  Meriam SpragueWarschkow,Donna Relative [phone] or Dietrich,Elke Clearence CheekFriend [phone]     Current Level of Care: Hospital Recommended Level of Care: Skilled Nursing Facility Prior Approval Number:    Date Approved/Denied:   PASRR Number: [id number]  Discharge Plan: SNF    Current Diagnoses: Patient Active Problem List   Diagnosis Date Noted  . S/P total knee arthroplasty 03/24/2017  . UTI (urinary tract infection) 03/09/2017  . H/O diverticulitis of colon 07/29/2016  . Broken arm 07/29/2016  . Chronic venous insufficiency 04/22/2016  . Pain in limb 04/22/2016  . Abnormal finding on mammography 03/28/2015  . Adult BMI 30+ 03/28/2015  . Chronic kidney disease (CKD), stage III (moderate) (HCC) 03/28/2015  . Chronic LBP 03/28/2015  . Can't get food down 03/28/2015  . Lymphedema 03/28/2015  . Degeneration macular 03/28/2015  . Amnesia 03/28/2015  . Subclinical hypothyroidism 03/28/2015  . Hypercholesterolemia 03/28/2015  . Pain in the chest 02/14/2015  . SOB (shortness of breath) 02/14/2015  . Morbid obesity (HCC) 02/14/2015  . Unsteady gait 02/14/2015  . Primary osteoarthritis of both knees 02/14/2015  . Essential hypertension 02/14/2015  . Anxiety and depression 02/14/2015  . Joint pain 01/04/2014  . Bursitis, hip 10/19/2013  . Spondylolisthesis 10/28/2012  . Arthritis, degenerative 08/25/2009  . Type 2 diabetes mellitus (HCC) 07/03/2007  . Colon, diverticulosis 02/12/2001  . OP (osteoporosis) 02/12/2001     Orientation RESPIRATION BLADDER Height & Weight     Self, Time, Situation, Place  Normal Continent Weight: 184 lb (83.5 kg) Height:  5\' 4"  (162.6 cm)  BEHAVIORAL SYMPTOMS/MOOD NEUROLOGICAL BOWEL NUTRITION STATUS      Continent Diet(Low sodium diet)  AMBULATORY STATUS COMMUNICATION OF NEEDS Skin   Limited Assist Verbally Normal                       Personal Care Assistance Level of Assistance  Bathing, Feeding, Dressing Bathing Assistance: Limited assistance Feeding assistance: Independent Dressing Assistance: Limited assistance     Functional Limitations Info  Sight, Hearing, Speech Sight Info: Adequate Hearing Info: Adequate Speech Info: Adequate    SPECIAL CARE FACTORS FREQUENCY  PT (By licensed PT), OT (By licensed OT)     PT Frequency: 5x a week OT Frequency: 5x a week            Contractures Contractures Info: Not present    Additional Factors Info  Code Status, Allergies, Psychotropic Code Status Info: Full Code Allergies Info: NKA Psychotropic Info: Sertraline (Zoloft) 50mg  tablet  busPIRone (BUSPAR) tablet 10 mg         Current Medications (05/13/2018):  This is the current hospital active medication list Current Facility-Administered Medications  Medication Dose Route Frequency Provider Last Rate Last Dose  . acetaminophen (TYLENOL) tablet 650 mg  650 mg Oral Q6H PRN Tommi RumpsSummers, Rhonda L, PA-C      . busPIRone (BUSPAR) tablet 10 mg  10 mg Oral TID Bridget HartshornSummers, Rhonda L, PA-C   10 mg at 05/13/18 0950  . donepezil (ARICEPT) tablet 10 mg  10 mg Oral BID  PC Bridget Hartshorn L, PA-C   10 mg at 05/13/18 [id number]  . hydrochlorothiazide (HYDRODIURIL) tablet 25 mg  25 mg Oral Daily Bridget Hartshorn L, PA-C   25 mg at 05/13/18 2956  . lisinopril (PRINIVIL,ZESTRIL) tablet 20 mg  20 mg Oral Daily Bridget Hartshorn L, PA-C   20 mg at 05/13/18 0951  . mirabegron ER (MYRBETRIQ) tablet 50 mg  50 mg Oral Daily Bridget Hartshorn L, PA-C   50 mg at 05/13/18 2130  .  olopatadine (PATANOL) 0.1 % ophthalmic solution 1 drop  1 drop Both Eyes BID Bridget Hartshorn L, PA-C   1 drop at 05/13/18 0953  . pravastatin (PRAVACHOL) tablet 10 mg  10 mg Oral q1800 Bridget Hartshorn L, PA-C   10 mg at 05/12/18 1913  . sertraline (ZOLOFT) tablet 100 mg  100 mg Oral Daily Bridget Hartshorn L, PA-C   100 mg at 05/13/18 8657  . traMADol (ULTRAM) tablet 50 mg  50 mg Oral Q8H PRN Bridget Hartshorn L, PA-C   50 mg at 05/12/18 2229   Current Outpatient Medications  Medication Sig Dispense Refill  . acetaminophen (TYLENOL) 650 MG CR tablet Take by mouth.    . ARTIFICIAL TEAR SOLUTION OP Apply 1 drop to eye daily.     . B Complex Vitamins (VITAMIN B COMPLEX PO) Take 1 tablet by mouth daily.     Marland Kitchen donepezil (ARICEPT) 5 MG tablet Take by mouth.    Marland Kitchen lisinopril-hydrochlorothiazide (PRINZIDE,ZESTORETIC) 20-25 MG tablet Take by mouth.    . Multiple Minerals-Vitamins (CALCIUM-MAGNESIUM-ZINC-D3 PO) Take 1 tablet by mouth daily.    . Multiple Vitamins-Minerals (PRESERVISION AREDS) CAPS Take 2 capsules by mouth daily.     . Olopatadine HCl (PATADAY) 0.2 % SOLN Apply to eye.    Marland Kitchen oxyCODONE (OXY IR/ROXICODONE) 5 MG immediate release tablet Take by mouth.    . pravastatin (PRAVACHOL) 10 MG tablet TAKE 1 TABLET BY MOUTH EVERY NIGHT AT BEDTIME    . Psyllium (METAMUCIL PO) Take 6-7 capsules by mouth daily.     . sertraline (ZOLOFT) 50 MG tablet Take by mouth.    . traMADol (ULTRAM) 50 MG tablet 1 tablet every 6-8 hours prn moderate pain 15 tablet 0  . vitamin E 400 UNIT capsule Take 400 Units by mouth daily.        Discharge Medications: Please see discharge summary for a list of discharge medications.  Relevant Imaging Results:  Relevant Lab Results:   Additional Information SSN [id number]  Darleene Cleaver, [place]

## 2018-05-13 NOTE — ED Notes (Signed)
Pt taken to [facility] report called. NAD. VSS. Pts belongings given to pt on EMS stretcher

## 2018-05-13 NOTE — ED Notes (Signed)
Pt awake and oriented at this time. She reports that she wants to try to sleep a little while longer. No concerns or needs voiced. Call light within reach. Will continue to monitor for changes.

## 2018-05-18 DIAGNOSIS — F331 Major depressive disorder, recurrent, moderate: Secondary | ICD-10-CM | POA: Diagnosis not present

## 2018-05-18 DIAGNOSIS — F419 Anxiety disorder, unspecified: Secondary | ICD-10-CM | POA: Diagnosis not present

## 2018-05-25 DIAGNOSIS — F331 Major depressive disorder, recurrent, moderate: Secondary | ICD-10-CM | POA: Diagnosis not present

## 2018-05-25 DIAGNOSIS — F419 Anxiety disorder, unspecified: Secondary | ICD-10-CM | POA: Diagnosis not present

## 2018-06-01 DIAGNOSIS — F331 Major depressive disorder, recurrent, moderate: Secondary | ICD-10-CM | POA: Diagnosis not present

## 2018-06-01 DIAGNOSIS — F419 Anxiety disorder, unspecified: Secondary | ICD-10-CM | POA: Diagnosis not present

## 2018-06-04 DIAGNOSIS — R41841 Cognitive communication deficit: Secondary | ICD-10-CM | POA: Diagnosis not present

## 2018-06-04 DIAGNOSIS — S22080D Wedge compression fracture of T11-T12 vertebra, subsequent encounter for fracture with routine healing: Secondary | ICD-10-CM | POA: Diagnosis not present

## 2018-06-04 DIAGNOSIS — R262 Difficulty in walking, not elsewhere classified: Secondary | ICD-10-CM | POA: Diagnosis not present

## 2018-06-04 DIAGNOSIS — R296 Repeated falls: Secondary | ICD-10-CM | POA: Diagnosis not present

## 2018-06-04 DIAGNOSIS — M6281 Muscle weakness (generalized): Secondary | ICD-10-CM | POA: Diagnosis not present

## 2018-06-04 DIAGNOSIS — R278 Other lack of coordination: Secondary | ICD-10-CM | POA: Diagnosis not present

## 2018-06-04 DIAGNOSIS — S22080A Wedge compression fracture of T11-T12 vertebra, initial encounter for closed fracture: Secondary | ICD-10-CM | POA: Diagnosis not present

## 2018-06-04 DIAGNOSIS — Z7409 Other reduced mobility: Secondary | ICD-10-CM | POA: Diagnosis not present

## 2018-06-05 DIAGNOSIS — S22080A Wedge compression fracture of T11-T12 vertebra, initial encounter for closed fracture: Secondary | ICD-10-CM | POA: Diagnosis not present

## 2018-06-05 DIAGNOSIS — M6281 Muscle weakness (generalized): Secondary | ICD-10-CM | POA: Diagnosis not present

## 2018-06-05 DIAGNOSIS — Z7409 Other reduced mobility: Secondary | ICD-10-CM | POA: Diagnosis not present

## 2018-06-05 DIAGNOSIS — R278 Other lack of coordination: Secondary | ICD-10-CM | POA: Diagnosis not present

## 2018-06-05 DIAGNOSIS — S22080D Wedge compression fracture of T11-T12 vertebra, subsequent encounter for fracture with routine healing: Secondary | ICD-10-CM | POA: Diagnosis not present

## 2018-06-05 DIAGNOSIS — R296 Repeated falls: Secondary | ICD-10-CM | POA: Diagnosis not present

## 2018-06-05 DIAGNOSIS — R262 Difficulty in walking, not elsewhere classified: Secondary | ICD-10-CM | POA: Diagnosis not present

## 2018-06-05 DIAGNOSIS — R41841 Cognitive communication deficit: Secondary | ICD-10-CM | POA: Diagnosis not present

## 2018-06-06 DIAGNOSIS — Z7409 Other reduced mobility: Secondary | ICD-10-CM | POA: Diagnosis not present

## 2018-06-06 DIAGNOSIS — R278 Other lack of coordination: Secondary | ICD-10-CM | POA: Diagnosis not present

## 2018-06-06 DIAGNOSIS — S22080A Wedge compression fracture of T11-T12 vertebra, initial encounter for closed fracture: Secondary | ICD-10-CM | POA: Diagnosis not present

## 2018-06-06 DIAGNOSIS — S22080D Wedge compression fracture of T11-T12 vertebra, subsequent encounter for fracture with routine healing: Secondary | ICD-10-CM | POA: Diagnosis not present

## 2018-06-06 DIAGNOSIS — R41841 Cognitive communication deficit: Secondary | ICD-10-CM | POA: Diagnosis not present

## 2018-06-06 DIAGNOSIS — R296 Repeated falls: Secondary | ICD-10-CM | POA: Diagnosis not present

## 2018-06-06 DIAGNOSIS — R262 Difficulty in walking, not elsewhere classified: Secondary | ICD-10-CM | POA: Diagnosis not present

## 2018-06-06 DIAGNOSIS — M6281 Muscle weakness (generalized): Secondary | ICD-10-CM | POA: Diagnosis not present

## 2018-06-08 DIAGNOSIS — R262 Difficulty in walking, not elsewhere classified: Secondary | ICD-10-CM | POA: Diagnosis not present

## 2018-06-08 DIAGNOSIS — S22080D Wedge compression fracture of T11-T12 vertebra, subsequent encounter for fracture with routine healing: Secondary | ICD-10-CM | POA: Diagnosis not present

## 2018-06-08 DIAGNOSIS — Z7409 Other reduced mobility: Secondary | ICD-10-CM | POA: Diagnosis not present

## 2018-06-08 DIAGNOSIS — F419 Anxiety disorder, unspecified: Secondary | ICD-10-CM | POA: Diagnosis not present

## 2018-06-08 DIAGNOSIS — M6281 Muscle weakness (generalized): Secondary | ICD-10-CM | POA: Diagnosis not present

## 2018-06-08 DIAGNOSIS — R296 Repeated falls: Secondary | ICD-10-CM | POA: Diagnosis not present

## 2018-06-08 DIAGNOSIS — S22080A Wedge compression fracture of T11-T12 vertebra, initial encounter for closed fracture: Secondary | ICD-10-CM | POA: Diagnosis not present

## 2018-06-08 DIAGNOSIS — R278 Other lack of coordination: Secondary | ICD-10-CM | POA: Diagnosis not present

## 2018-06-08 DIAGNOSIS — R41841 Cognitive communication deficit: Secondary | ICD-10-CM | POA: Diagnosis not present

## 2018-06-08 DIAGNOSIS — F331 Major depressive disorder, recurrent, moderate: Secondary | ICD-10-CM | POA: Diagnosis not present

## 2018-06-09 DIAGNOSIS — S22080D Wedge compression fracture of T11-T12 vertebra, subsequent encounter for fracture with routine healing: Secondary | ICD-10-CM | POA: Diagnosis not present

## 2018-06-09 DIAGNOSIS — Z7409 Other reduced mobility: Secondary | ICD-10-CM | POA: Diagnosis not present

## 2018-06-09 DIAGNOSIS — S22080A Wedge compression fracture of T11-T12 vertebra, initial encounter for closed fracture: Secondary | ICD-10-CM | POA: Diagnosis not present

## 2018-06-09 DIAGNOSIS — M6281 Muscle weakness (generalized): Secondary | ICD-10-CM | POA: Diagnosis not present

## 2018-06-09 DIAGNOSIS — R41841 Cognitive communication deficit: Secondary | ICD-10-CM | POA: Diagnosis not present

## 2018-06-09 DIAGNOSIS — R278 Other lack of coordination: Secondary | ICD-10-CM | POA: Diagnosis not present

## 2018-06-09 DIAGNOSIS — R262 Difficulty in walking, not elsewhere classified: Secondary | ICD-10-CM | POA: Diagnosis not present

## 2018-06-09 DIAGNOSIS — R296 Repeated falls: Secondary | ICD-10-CM | POA: Diagnosis not present

## 2018-06-10 DIAGNOSIS — R278 Other lack of coordination: Secondary | ICD-10-CM | POA: Diagnosis not present

## 2018-06-10 DIAGNOSIS — E559 Vitamin D deficiency, unspecified: Secondary | ICD-10-CM | POA: Diagnosis not present

## 2018-06-10 DIAGNOSIS — S22080D Wedge compression fracture of T11-T12 vertebra, subsequent encounter for fracture with routine healing: Secondary | ICD-10-CM | POA: Diagnosis not present

## 2018-06-10 DIAGNOSIS — R296 Repeated falls: Secondary | ICD-10-CM | POA: Diagnosis not present

## 2018-06-10 DIAGNOSIS — Z7409 Other reduced mobility: Secondary | ICD-10-CM | POA: Diagnosis not present

## 2018-06-10 DIAGNOSIS — R41841 Cognitive communication deficit: Secondary | ICD-10-CM | POA: Diagnosis not present

## 2018-06-10 DIAGNOSIS — E785 Hyperlipidemia, unspecified: Secondary | ICD-10-CM | POA: Diagnosis not present

## 2018-06-10 DIAGNOSIS — D649 Anemia, unspecified: Secondary | ICD-10-CM | POA: Diagnosis not present

## 2018-06-10 DIAGNOSIS — R262 Difficulty in walking, not elsewhere classified: Secondary | ICD-10-CM | POA: Diagnosis not present

## 2018-06-10 DIAGNOSIS — S22080A Wedge compression fracture of T11-T12 vertebra, initial encounter for closed fracture: Secondary | ICD-10-CM | POA: Diagnosis not present

## 2018-06-10 DIAGNOSIS — M6281 Muscle weakness (generalized): Secondary | ICD-10-CM | POA: Diagnosis not present

## 2018-06-11 DIAGNOSIS — S22080A Wedge compression fracture of T11-T12 vertebra, initial encounter for closed fracture: Secondary | ICD-10-CM | POA: Diagnosis not present

## 2018-06-11 DIAGNOSIS — R278 Other lack of coordination: Secondary | ICD-10-CM | POA: Diagnosis not present

## 2018-06-11 DIAGNOSIS — R262 Difficulty in walking, not elsewhere classified: Secondary | ICD-10-CM | POA: Diagnosis not present

## 2018-06-11 DIAGNOSIS — R41841 Cognitive communication deficit: Secondary | ICD-10-CM | POA: Diagnosis not present

## 2018-06-11 DIAGNOSIS — R296 Repeated falls: Secondary | ICD-10-CM | POA: Diagnosis not present

## 2018-06-11 DIAGNOSIS — Z7409 Other reduced mobility: Secondary | ICD-10-CM | POA: Diagnosis not present

## 2018-06-11 DIAGNOSIS — M6281 Muscle weakness (generalized): Secondary | ICD-10-CM | POA: Diagnosis not present

## 2018-06-11 DIAGNOSIS — S22080D Wedge compression fracture of T11-T12 vertebra, subsequent encounter for fracture with routine healing: Secondary | ICD-10-CM | POA: Diagnosis not present

## 2018-06-12 DIAGNOSIS — R296 Repeated falls: Secondary | ICD-10-CM | POA: Diagnosis not present

## 2018-06-12 DIAGNOSIS — R41841 Cognitive communication deficit: Secondary | ICD-10-CM | POA: Diagnosis not present

## 2018-06-12 DIAGNOSIS — Z7409 Other reduced mobility: Secondary | ICD-10-CM | POA: Diagnosis not present

## 2018-06-12 DIAGNOSIS — S22080A Wedge compression fracture of T11-T12 vertebra, initial encounter for closed fracture: Secondary | ICD-10-CM | POA: Diagnosis not present

## 2018-06-12 DIAGNOSIS — R262 Difficulty in walking, not elsewhere classified: Secondary | ICD-10-CM | POA: Diagnosis not present

## 2018-06-12 DIAGNOSIS — R278 Other lack of coordination: Secondary | ICD-10-CM | POA: Diagnosis not present

## 2018-06-12 DIAGNOSIS — S22080D Wedge compression fracture of T11-T12 vertebra, subsequent encounter for fracture with routine healing: Secondary | ICD-10-CM | POA: Diagnosis not present

## 2018-06-12 DIAGNOSIS — M6281 Muscle weakness (generalized): Secondary | ICD-10-CM | POA: Diagnosis not present

## 2018-06-13 DIAGNOSIS — R41841 Cognitive communication deficit: Secondary | ICD-10-CM | POA: Diagnosis not present

## 2018-06-13 DIAGNOSIS — Z7409 Other reduced mobility: Secondary | ICD-10-CM | POA: Diagnosis not present

## 2018-06-13 DIAGNOSIS — R262 Difficulty in walking, not elsewhere classified: Secondary | ICD-10-CM | POA: Diagnosis not present

## 2018-06-13 DIAGNOSIS — M6281 Muscle weakness (generalized): Secondary | ICD-10-CM | POA: Diagnosis not present

## 2018-06-13 DIAGNOSIS — R278 Other lack of coordination: Secondary | ICD-10-CM | POA: Diagnosis not present

## 2018-06-13 DIAGNOSIS — R296 Repeated falls: Secondary | ICD-10-CM | POA: Diagnosis not present

## 2018-06-13 DIAGNOSIS — S22080D Wedge compression fracture of T11-T12 vertebra, subsequent encounter for fracture with routine healing: Secondary | ICD-10-CM | POA: Diagnosis not present

## 2018-06-13 DIAGNOSIS — S22080A Wedge compression fracture of T11-T12 vertebra, initial encounter for closed fracture: Secondary | ICD-10-CM | POA: Diagnosis not present

## 2018-06-15 DIAGNOSIS — R278 Other lack of coordination: Secondary | ICD-10-CM | POA: Diagnosis not present

## 2018-06-15 DIAGNOSIS — F419 Anxiety disorder, unspecified: Secondary | ICD-10-CM | POA: Diagnosis not present

## 2018-06-15 DIAGNOSIS — S22080A Wedge compression fracture of T11-T12 vertebra, initial encounter for closed fracture: Secondary | ICD-10-CM | POA: Diagnosis not present

## 2018-06-15 DIAGNOSIS — R296 Repeated falls: Secondary | ICD-10-CM | POA: Diagnosis not present

## 2018-06-15 DIAGNOSIS — Z7409 Other reduced mobility: Secondary | ICD-10-CM | POA: Diagnosis not present

## 2018-06-15 DIAGNOSIS — M6281 Muscle weakness (generalized): Secondary | ICD-10-CM | POA: Diagnosis not present

## 2018-06-15 DIAGNOSIS — R41841 Cognitive communication deficit: Secondary | ICD-10-CM | POA: Diagnosis not present

## 2018-06-15 DIAGNOSIS — R262 Difficulty in walking, not elsewhere classified: Secondary | ICD-10-CM | POA: Diagnosis not present

## 2018-06-15 DIAGNOSIS — S22080D Wedge compression fracture of T11-T12 vertebra, subsequent encounter for fracture with routine healing: Secondary | ICD-10-CM | POA: Diagnosis not present

## 2018-06-15 DIAGNOSIS — F331 Major depressive disorder, recurrent, moderate: Secondary | ICD-10-CM | POA: Diagnosis not present

## 2018-06-16 DIAGNOSIS — R278 Other lack of coordination: Secondary | ICD-10-CM | POA: Diagnosis not present

## 2018-06-16 DIAGNOSIS — S22080A Wedge compression fracture of T11-T12 vertebra, initial encounter for closed fracture: Secondary | ICD-10-CM | POA: Diagnosis not present

## 2018-06-16 DIAGNOSIS — R41841 Cognitive communication deficit: Secondary | ICD-10-CM | POA: Diagnosis not present

## 2018-06-16 DIAGNOSIS — M6281 Muscle weakness (generalized): Secondary | ICD-10-CM | POA: Diagnosis not present

## 2018-06-16 DIAGNOSIS — Z7409 Other reduced mobility: Secondary | ICD-10-CM | POA: Diagnosis not present

## 2018-06-16 DIAGNOSIS — R296 Repeated falls: Secondary | ICD-10-CM | POA: Diagnosis not present

## 2018-06-16 DIAGNOSIS — R262 Difficulty in walking, not elsewhere classified: Secondary | ICD-10-CM | POA: Diagnosis not present

## 2018-06-16 DIAGNOSIS — S22080D Wedge compression fracture of T11-T12 vertebra, subsequent encounter for fracture with routine healing: Secondary | ICD-10-CM | POA: Diagnosis not present

## 2018-06-17 DIAGNOSIS — R41841 Cognitive communication deficit: Secondary | ICD-10-CM | POA: Diagnosis not present

## 2018-06-17 DIAGNOSIS — Z7409 Other reduced mobility: Secondary | ICD-10-CM | POA: Diagnosis not present

## 2018-06-17 DIAGNOSIS — S22080D Wedge compression fracture of T11-T12 vertebra, subsequent encounter for fracture with routine healing: Secondary | ICD-10-CM | POA: Diagnosis not present

## 2018-06-17 DIAGNOSIS — S22080A Wedge compression fracture of T11-T12 vertebra, initial encounter for closed fracture: Secondary | ICD-10-CM | POA: Diagnosis not present

## 2018-06-17 DIAGNOSIS — R296 Repeated falls: Secondary | ICD-10-CM | POA: Diagnosis not present

## 2018-06-17 DIAGNOSIS — R278 Other lack of coordination: Secondary | ICD-10-CM | POA: Diagnosis not present

## 2018-06-17 DIAGNOSIS — R262 Difficulty in walking, not elsewhere classified: Secondary | ICD-10-CM | POA: Diagnosis not present

## 2018-06-17 DIAGNOSIS — M6281 Muscle weakness (generalized): Secondary | ICD-10-CM | POA: Diagnosis not present

## 2018-06-18 DIAGNOSIS — R41841 Cognitive communication deficit: Secondary | ICD-10-CM | POA: Diagnosis not present

## 2018-06-18 DIAGNOSIS — M6281 Muscle weakness (generalized): Secondary | ICD-10-CM | POA: Diagnosis not present

## 2018-06-18 DIAGNOSIS — R262 Difficulty in walking, not elsewhere classified: Secondary | ICD-10-CM | POA: Diagnosis not present

## 2018-06-18 DIAGNOSIS — R278 Other lack of coordination: Secondary | ICD-10-CM | POA: Diagnosis not present

## 2018-06-18 DIAGNOSIS — R296 Repeated falls: Secondary | ICD-10-CM | POA: Diagnosis not present

## 2018-06-18 DIAGNOSIS — S22080D Wedge compression fracture of T11-T12 vertebra, subsequent encounter for fracture with routine healing: Secondary | ICD-10-CM | POA: Diagnosis not present

## 2018-06-18 DIAGNOSIS — Z7409 Other reduced mobility: Secondary | ICD-10-CM | POA: Diagnosis not present

## 2018-06-18 DIAGNOSIS — S22080A Wedge compression fracture of T11-T12 vertebra, initial encounter for closed fracture: Secondary | ICD-10-CM | POA: Diagnosis not present

## 2018-06-19 DIAGNOSIS — R262 Difficulty in walking, not elsewhere classified: Secondary | ICD-10-CM | POA: Diagnosis not present

## 2018-06-19 DIAGNOSIS — S22080D Wedge compression fracture of T11-T12 vertebra, subsequent encounter for fracture with routine healing: Secondary | ICD-10-CM | POA: Diagnosis not present

## 2018-06-19 DIAGNOSIS — R296 Repeated falls: Secondary | ICD-10-CM | POA: Diagnosis not present

## 2018-06-19 DIAGNOSIS — R278 Other lack of coordination: Secondary | ICD-10-CM | POA: Diagnosis not present

## 2018-06-19 DIAGNOSIS — S22080A Wedge compression fracture of T11-T12 vertebra, initial encounter for closed fracture: Secondary | ICD-10-CM | POA: Diagnosis not present

## 2018-06-19 DIAGNOSIS — M6281 Muscle weakness (generalized): Secondary | ICD-10-CM | POA: Diagnosis not present

## 2018-06-19 DIAGNOSIS — R41841 Cognitive communication deficit: Secondary | ICD-10-CM | POA: Diagnosis not present

## 2018-06-19 DIAGNOSIS — Z7409 Other reduced mobility: Secondary | ICD-10-CM | POA: Diagnosis not present

## 2018-06-22 DIAGNOSIS — S22080A Wedge compression fracture of T11-T12 vertebra, initial encounter for closed fracture: Secondary | ICD-10-CM | POA: Diagnosis not present

## 2018-06-22 DIAGNOSIS — S22080D Wedge compression fracture of T11-T12 vertebra, subsequent encounter for fracture with routine healing: Secondary | ICD-10-CM | POA: Diagnosis not present

## 2018-06-22 DIAGNOSIS — Z7409 Other reduced mobility: Secondary | ICD-10-CM | POA: Diagnosis not present

## 2018-06-22 DIAGNOSIS — R41841 Cognitive communication deficit: Secondary | ICD-10-CM | POA: Diagnosis not present

## 2018-06-22 DIAGNOSIS — R262 Difficulty in walking, not elsewhere classified: Secondary | ICD-10-CM | POA: Diagnosis not present

## 2018-06-22 DIAGNOSIS — F331 Major depressive disorder, recurrent, moderate: Secondary | ICD-10-CM | POA: Diagnosis not present

## 2018-06-22 DIAGNOSIS — M6281 Muscle weakness (generalized): Secondary | ICD-10-CM | POA: Diagnosis not present

## 2018-06-22 DIAGNOSIS — R278 Other lack of coordination: Secondary | ICD-10-CM | POA: Diagnosis not present

## 2018-06-22 DIAGNOSIS — F419 Anxiety disorder, unspecified: Secondary | ICD-10-CM | POA: Diagnosis not present

## 2018-06-22 DIAGNOSIS — R296 Repeated falls: Secondary | ICD-10-CM | POA: Diagnosis not present

## 2018-06-23 DIAGNOSIS — R296 Repeated falls: Secondary | ICD-10-CM | POA: Diagnosis not present

## 2018-06-23 DIAGNOSIS — M6281 Muscle weakness (generalized): Secondary | ICD-10-CM | POA: Diagnosis not present

## 2018-06-23 DIAGNOSIS — R278 Other lack of coordination: Secondary | ICD-10-CM | POA: Diagnosis not present

## 2018-06-23 DIAGNOSIS — S22080D Wedge compression fracture of T11-T12 vertebra, subsequent encounter for fracture with routine healing: Secondary | ICD-10-CM | POA: Diagnosis not present

## 2018-06-23 DIAGNOSIS — Z7409 Other reduced mobility: Secondary | ICD-10-CM | POA: Diagnosis not present

## 2018-06-23 DIAGNOSIS — R41841 Cognitive communication deficit: Secondary | ICD-10-CM | POA: Diagnosis not present

## 2018-06-23 DIAGNOSIS — S22080A Wedge compression fracture of T11-T12 vertebra, initial encounter for closed fracture: Secondary | ICD-10-CM | POA: Diagnosis not present

## 2018-06-23 DIAGNOSIS — R262 Difficulty in walking, not elsewhere classified: Secondary | ICD-10-CM | POA: Diagnosis not present

## 2018-06-24 DIAGNOSIS — R278 Other lack of coordination: Secondary | ICD-10-CM | POA: Diagnosis not present

## 2018-06-24 DIAGNOSIS — R41841 Cognitive communication deficit: Secondary | ICD-10-CM | POA: Diagnosis not present

## 2018-06-24 DIAGNOSIS — M6281 Muscle weakness (generalized): Secondary | ICD-10-CM | POA: Diagnosis not present

## 2018-06-24 DIAGNOSIS — R262 Difficulty in walking, not elsewhere classified: Secondary | ICD-10-CM | POA: Diagnosis not present

## 2018-06-24 DIAGNOSIS — S22080D Wedge compression fracture of T11-T12 vertebra, subsequent encounter for fracture with routine healing: Secondary | ICD-10-CM | POA: Diagnosis not present

## 2018-06-24 DIAGNOSIS — Z7409 Other reduced mobility: Secondary | ICD-10-CM | POA: Diagnosis not present

## 2018-06-24 DIAGNOSIS — S22080A Wedge compression fracture of T11-T12 vertebra, initial encounter for closed fracture: Secondary | ICD-10-CM | POA: Diagnosis not present

## 2018-06-24 DIAGNOSIS — R296 Repeated falls: Secondary | ICD-10-CM | POA: Diagnosis not present

## 2018-06-25 DIAGNOSIS — R296 Repeated falls: Secondary | ICD-10-CM | POA: Diagnosis not present

## 2018-06-25 DIAGNOSIS — Z7409 Other reduced mobility: Secondary | ICD-10-CM | POA: Diagnosis not present

## 2018-06-25 DIAGNOSIS — R278 Other lack of coordination: Secondary | ICD-10-CM | POA: Diagnosis not present

## 2018-06-25 DIAGNOSIS — S22080A Wedge compression fracture of T11-T12 vertebra, initial encounter for closed fracture: Secondary | ICD-10-CM | POA: Diagnosis not present

## 2018-06-25 DIAGNOSIS — S22080D Wedge compression fracture of T11-T12 vertebra, subsequent encounter for fracture with routine healing: Secondary | ICD-10-CM | POA: Diagnosis not present

## 2018-06-25 DIAGNOSIS — R262 Difficulty in walking, not elsewhere classified: Secondary | ICD-10-CM | POA: Diagnosis not present

## 2018-06-25 DIAGNOSIS — M6281 Muscle weakness (generalized): Secondary | ICD-10-CM | POA: Diagnosis not present

## 2018-06-25 DIAGNOSIS — R41841 Cognitive communication deficit: Secondary | ICD-10-CM | POA: Diagnosis not present

## 2018-06-26 DIAGNOSIS — S22080A Wedge compression fracture of T11-T12 vertebra, initial encounter for closed fracture: Secondary | ICD-10-CM | POA: Diagnosis not present

## 2018-06-26 DIAGNOSIS — Z7409 Other reduced mobility: Secondary | ICD-10-CM | POA: Diagnosis not present

## 2018-06-26 DIAGNOSIS — S22080D Wedge compression fracture of T11-T12 vertebra, subsequent encounter for fracture with routine healing: Secondary | ICD-10-CM | POA: Diagnosis not present

## 2018-06-26 DIAGNOSIS — R278 Other lack of coordination: Secondary | ICD-10-CM | POA: Diagnosis not present

## 2018-06-26 DIAGNOSIS — R296 Repeated falls: Secondary | ICD-10-CM | POA: Diagnosis not present

## 2018-06-26 DIAGNOSIS — M6281 Muscle weakness (generalized): Secondary | ICD-10-CM | POA: Diagnosis not present

## 2018-06-26 DIAGNOSIS — R41841 Cognitive communication deficit: Secondary | ICD-10-CM | POA: Diagnosis not present

## 2018-06-26 DIAGNOSIS — R262 Difficulty in walking, not elsewhere classified: Secondary | ICD-10-CM | POA: Diagnosis not present

## 2018-06-27 DIAGNOSIS — R296 Repeated falls: Secondary | ICD-10-CM | POA: Diagnosis not present

## 2018-06-27 DIAGNOSIS — S22080D Wedge compression fracture of T11-T12 vertebra, subsequent encounter for fracture with routine healing: Secondary | ICD-10-CM | POA: Diagnosis not present

## 2018-06-27 DIAGNOSIS — R278 Other lack of coordination: Secondary | ICD-10-CM | POA: Diagnosis not present

## 2018-06-27 DIAGNOSIS — R41841 Cognitive communication deficit: Secondary | ICD-10-CM | POA: Diagnosis not present

## 2018-06-27 DIAGNOSIS — Z7409 Other reduced mobility: Secondary | ICD-10-CM | POA: Diagnosis not present

## 2018-06-27 DIAGNOSIS — S22080A Wedge compression fracture of T11-T12 vertebra, initial encounter for closed fracture: Secondary | ICD-10-CM | POA: Diagnosis not present

## 2018-06-27 DIAGNOSIS — M6281 Muscle weakness (generalized): Secondary | ICD-10-CM | POA: Diagnosis not present

## 2018-06-27 DIAGNOSIS — R262 Difficulty in walking, not elsewhere classified: Secondary | ICD-10-CM | POA: Diagnosis not present

## 2018-06-28 DIAGNOSIS — R269 Unspecified abnormalities of gait and mobility: Secondary | ICD-10-CM | POA: Diagnosis not present

## 2018-06-28 DIAGNOSIS — J811 Chronic pulmonary edema: Secondary | ICD-10-CM | POA: Diagnosis not present

## 2018-06-28 DIAGNOSIS — M545 Low back pain: Secondary | ICD-10-CM | POA: Diagnosis not present

## 2018-06-29 DIAGNOSIS — R278 Other lack of coordination: Secondary | ICD-10-CM | POA: Diagnosis not present

## 2018-06-29 DIAGNOSIS — S22080A Wedge compression fracture of T11-T12 vertebra, initial encounter for closed fracture: Secondary | ICD-10-CM | POA: Diagnosis not present

## 2018-06-29 DIAGNOSIS — Z7409 Other reduced mobility: Secondary | ICD-10-CM | POA: Diagnosis not present

## 2018-06-29 DIAGNOSIS — F419 Anxiety disorder, unspecified: Secondary | ICD-10-CM | POA: Diagnosis not present

## 2018-06-29 DIAGNOSIS — R41841 Cognitive communication deficit: Secondary | ICD-10-CM | POA: Diagnosis not present

## 2018-06-29 DIAGNOSIS — R296 Repeated falls: Secondary | ICD-10-CM | POA: Diagnosis not present

## 2018-06-29 DIAGNOSIS — S22080D Wedge compression fracture of T11-T12 vertebra, subsequent encounter for fracture with routine healing: Secondary | ICD-10-CM | POA: Diagnosis not present

## 2018-06-29 DIAGNOSIS — F331 Major depressive disorder, recurrent, moderate: Secondary | ICD-10-CM | POA: Diagnosis not present

## 2018-06-29 DIAGNOSIS — R262 Difficulty in walking, not elsewhere classified: Secondary | ICD-10-CM | POA: Diagnosis not present

## 2018-06-29 DIAGNOSIS — M6281 Muscle weakness (generalized): Secondary | ICD-10-CM | POA: Diagnosis not present

## 2018-06-30 DIAGNOSIS — Z7409 Other reduced mobility: Secondary | ICD-10-CM | POA: Diagnosis not present

## 2018-06-30 DIAGNOSIS — R278 Other lack of coordination: Secondary | ICD-10-CM | POA: Diagnosis not present

## 2018-06-30 DIAGNOSIS — S22080D Wedge compression fracture of T11-T12 vertebra, subsequent encounter for fracture with routine healing: Secondary | ICD-10-CM | POA: Diagnosis not present

## 2018-06-30 DIAGNOSIS — R296 Repeated falls: Secondary | ICD-10-CM | POA: Diagnosis not present

## 2018-06-30 DIAGNOSIS — R262 Difficulty in walking, not elsewhere classified: Secondary | ICD-10-CM | POA: Diagnosis not present

## 2018-06-30 DIAGNOSIS — M6281 Muscle weakness (generalized): Secondary | ICD-10-CM | POA: Diagnosis not present

## 2018-06-30 DIAGNOSIS — R41841 Cognitive communication deficit: Secondary | ICD-10-CM | POA: Diagnosis not present

## 2018-06-30 DIAGNOSIS — S22080A Wedge compression fracture of T11-T12 vertebra, initial encounter for closed fracture: Secondary | ICD-10-CM | POA: Diagnosis not present

## 2018-07-01 DIAGNOSIS — R296 Repeated falls: Secondary | ICD-10-CM | POA: Diagnosis not present

## 2018-07-01 DIAGNOSIS — R278 Other lack of coordination: Secondary | ICD-10-CM | POA: Diagnosis not present

## 2018-07-01 DIAGNOSIS — R262 Difficulty in walking, not elsewhere classified: Secondary | ICD-10-CM | POA: Diagnosis not present

## 2018-07-01 DIAGNOSIS — R41841 Cognitive communication deficit: Secondary | ICD-10-CM | POA: Diagnosis not present

## 2018-07-01 DIAGNOSIS — Z7409 Other reduced mobility: Secondary | ICD-10-CM | POA: Diagnosis not present

## 2018-07-01 DIAGNOSIS — S22080D Wedge compression fracture of T11-T12 vertebra, subsequent encounter for fracture with routine healing: Secondary | ICD-10-CM | POA: Diagnosis not present

## 2018-07-01 DIAGNOSIS — M6281 Muscle weakness (generalized): Secondary | ICD-10-CM | POA: Diagnosis not present

## 2018-07-01 DIAGNOSIS — S22080A Wedge compression fracture of T11-T12 vertebra, initial encounter for closed fracture: Secondary | ICD-10-CM | POA: Diagnosis not present

## 2018-07-02 DIAGNOSIS — R262 Difficulty in walking, not elsewhere classified: Secondary | ICD-10-CM | POA: Diagnosis not present

## 2018-07-02 DIAGNOSIS — S22080A Wedge compression fracture of T11-T12 vertebra, initial encounter for closed fracture: Secondary | ICD-10-CM | POA: Diagnosis not present

## 2018-07-02 DIAGNOSIS — S22080D Wedge compression fracture of T11-T12 vertebra, subsequent encounter for fracture with routine healing: Secondary | ICD-10-CM | POA: Diagnosis not present

## 2018-07-02 DIAGNOSIS — R296 Repeated falls: Secondary | ICD-10-CM | POA: Diagnosis not present

## 2018-07-02 DIAGNOSIS — M6281 Muscle weakness (generalized): Secondary | ICD-10-CM | POA: Diagnosis not present

## 2018-07-02 DIAGNOSIS — R278 Other lack of coordination: Secondary | ICD-10-CM | POA: Diagnosis not present

## 2018-07-02 DIAGNOSIS — Z7409 Other reduced mobility: Secondary | ICD-10-CM | POA: Diagnosis not present

## 2018-07-02 DIAGNOSIS — R41841 Cognitive communication deficit: Secondary | ICD-10-CM | POA: Diagnosis not present

## 2018-07-04 DIAGNOSIS — R296 Repeated falls: Secondary | ICD-10-CM | POA: Diagnosis not present

## 2018-07-04 DIAGNOSIS — Z7409 Other reduced mobility: Secondary | ICD-10-CM | POA: Diagnosis not present

## 2018-07-04 DIAGNOSIS — S22080D Wedge compression fracture of T11-T12 vertebra, subsequent encounter for fracture with routine healing: Secondary | ICD-10-CM | POA: Diagnosis not present

## 2018-07-04 DIAGNOSIS — R278 Other lack of coordination: Secondary | ICD-10-CM | POA: Diagnosis not present

## 2018-07-04 DIAGNOSIS — R262 Difficulty in walking, not elsewhere classified: Secondary | ICD-10-CM | POA: Diagnosis not present

## 2018-07-04 DIAGNOSIS — M6281 Muscle weakness (generalized): Secondary | ICD-10-CM | POA: Diagnosis not present

## 2018-07-05 DIAGNOSIS — R278 Other lack of coordination: Secondary | ICD-10-CM | POA: Diagnosis not present

## 2018-07-05 DIAGNOSIS — S22080D Wedge compression fracture of T11-T12 vertebra, subsequent encounter for fracture with routine healing: Secondary | ICD-10-CM | POA: Diagnosis not present

## 2018-07-05 DIAGNOSIS — M6281 Muscle weakness (generalized): Secondary | ICD-10-CM | POA: Diagnosis not present

## 2018-07-05 DIAGNOSIS — Z7409 Other reduced mobility: Secondary | ICD-10-CM | POA: Diagnosis not present

## 2018-07-05 DIAGNOSIS — R296 Repeated falls: Secondary | ICD-10-CM | POA: Diagnosis not present

## 2018-07-05 DIAGNOSIS — R262 Difficulty in walking, not elsewhere classified: Secondary | ICD-10-CM | POA: Diagnosis not present

## 2018-07-06 DIAGNOSIS — S22080D Wedge compression fracture of T11-T12 vertebra, subsequent encounter for fracture with routine healing: Secondary | ICD-10-CM | POA: Diagnosis not present

## 2018-07-06 DIAGNOSIS — R262 Difficulty in walking, not elsewhere classified: Secondary | ICD-10-CM | POA: Diagnosis not present

## 2018-07-06 DIAGNOSIS — Z7409 Other reduced mobility: Secondary | ICD-10-CM | POA: Diagnosis not present

## 2018-07-06 DIAGNOSIS — F419 Anxiety disorder, unspecified: Secondary | ICD-10-CM | POA: Diagnosis not present

## 2018-07-06 DIAGNOSIS — R278 Other lack of coordination: Secondary | ICD-10-CM | POA: Diagnosis not present

## 2018-07-06 DIAGNOSIS — M6281 Muscle weakness (generalized): Secondary | ICD-10-CM | POA: Diagnosis not present

## 2018-07-06 DIAGNOSIS — F331 Major depressive disorder, recurrent, moderate: Secondary | ICD-10-CM | POA: Diagnosis not present

## 2018-07-06 DIAGNOSIS — R296 Repeated falls: Secondary | ICD-10-CM | POA: Diagnosis not present

## 2018-07-07 DIAGNOSIS — M6281 Muscle weakness (generalized): Secondary | ICD-10-CM | POA: Diagnosis not present

## 2018-07-07 DIAGNOSIS — R296 Repeated falls: Secondary | ICD-10-CM | POA: Diagnosis not present

## 2018-07-07 DIAGNOSIS — S22080D Wedge compression fracture of T11-T12 vertebra, subsequent encounter for fracture with routine healing: Secondary | ICD-10-CM | POA: Diagnosis not present

## 2018-07-07 DIAGNOSIS — R262 Difficulty in walking, not elsewhere classified: Secondary | ICD-10-CM | POA: Diagnosis not present

## 2018-07-07 DIAGNOSIS — R278 Other lack of coordination: Secondary | ICD-10-CM | POA: Diagnosis not present

## 2018-07-07 DIAGNOSIS — Z7409 Other reduced mobility: Secondary | ICD-10-CM | POA: Diagnosis not present

## 2018-07-08 DIAGNOSIS — M6281 Muscle weakness (generalized): Secondary | ICD-10-CM | POA: Diagnosis not present

## 2018-07-08 DIAGNOSIS — S22080D Wedge compression fracture of T11-T12 vertebra, subsequent encounter for fracture with routine healing: Secondary | ICD-10-CM | POA: Diagnosis not present

## 2018-07-08 DIAGNOSIS — Z7409 Other reduced mobility: Secondary | ICD-10-CM | POA: Diagnosis not present

## 2018-07-08 DIAGNOSIS — R262 Difficulty in walking, not elsewhere classified: Secondary | ICD-10-CM | POA: Diagnosis not present

## 2018-07-08 DIAGNOSIS — R296 Repeated falls: Secondary | ICD-10-CM | POA: Diagnosis not present

## 2018-07-08 DIAGNOSIS — R278 Other lack of coordination: Secondary | ICD-10-CM | POA: Diagnosis not present

## 2018-07-09 DIAGNOSIS — S22080D Wedge compression fracture of T11-T12 vertebra, subsequent encounter for fracture with routine healing: Secondary | ICD-10-CM | POA: Diagnosis not present

## 2018-07-09 DIAGNOSIS — R262 Difficulty in walking, not elsewhere classified: Secondary | ICD-10-CM | POA: Diagnosis not present

## 2018-07-09 DIAGNOSIS — Z7409 Other reduced mobility: Secondary | ICD-10-CM | POA: Diagnosis not present

## 2018-07-09 DIAGNOSIS — M6281 Muscle weakness (generalized): Secondary | ICD-10-CM | POA: Diagnosis not present

## 2018-07-09 DIAGNOSIS — R296 Repeated falls: Secondary | ICD-10-CM | POA: Diagnosis not present

## 2018-07-09 DIAGNOSIS — R278 Other lack of coordination: Secondary | ICD-10-CM | POA: Diagnosis not present

## 2018-07-10 DIAGNOSIS — S22080D Wedge compression fracture of T11-T12 vertebra, subsequent encounter for fracture with routine healing: Secondary | ICD-10-CM | POA: Diagnosis not present

## 2018-07-10 DIAGNOSIS — Z7409 Other reduced mobility: Secondary | ICD-10-CM | POA: Diagnosis not present

## 2018-07-10 DIAGNOSIS — R278 Other lack of coordination: Secondary | ICD-10-CM | POA: Diagnosis not present

## 2018-07-10 DIAGNOSIS — R296 Repeated falls: Secondary | ICD-10-CM | POA: Diagnosis not present

## 2018-07-10 DIAGNOSIS — R262 Difficulty in walking, not elsewhere classified: Secondary | ICD-10-CM | POA: Diagnosis not present

## 2018-07-10 DIAGNOSIS — M6281 Muscle weakness (generalized): Secondary | ICD-10-CM | POA: Diagnosis not present

## 2018-07-12 DIAGNOSIS — R278 Other lack of coordination: Secondary | ICD-10-CM | POA: Diagnosis not present

## 2018-07-12 DIAGNOSIS — Z7409 Other reduced mobility: Secondary | ICD-10-CM | POA: Diagnosis not present

## 2018-07-12 DIAGNOSIS — M6281 Muscle weakness (generalized): Secondary | ICD-10-CM | POA: Diagnosis not present

## 2018-07-12 DIAGNOSIS — R262 Difficulty in walking, not elsewhere classified: Secondary | ICD-10-CM | POA: Diagnosis not present

## 2018-07-12 DIAGNOSIS — R296 Repeated falls: Secondary | ICD-10-CM | POA: Diagnosis not present

## 2018-07-12 DIAGNOSIS — S22080D Wedge compression fracture of T11-T12 vertebra, subsequent encounter for fracture with routine healing: Secondary | ICD-10-CM | POA: Diagnosis not present

## 2018-07-13 DIAGNOSIS — F419 Anxiety disorder, unspecified: Secondary | ICD-10-CM | POA: Diagnosis not present

## 2018-07-13 DIAGNOSIS — R262 Difficulty in walking, not elsewhere classified: Secondary | ICD-10-CM | POA: Diagnosis not present

## 2018-07-13 DIAGNOSIS — S22080D Wedge compression fracture of T11-T12 vertebra, subsequent encounter for fracture with routine healing: Secondary | ICD-10-CM | POA: Diagnosis not present

## 2018-07-13 DIAGNOSIS — M6281 Muscle weakness (generalized): Secondary | ICD-10-CM | POA: Diagnosis not present

## 2018-07-13 DIAGNOSIS — R296 Repeated falls: Secondary | ICD-10-CM | POA: Diagnosis not present

## 2018-07-13 DIAGNOSIS — F331 Major depressive disorder, recurrent, moderate: Secondary | ICD-10-CM | POA: Diagnosis not present

## 2018-07-13 DIAGNOSIS — R278 Other lack of coordination: Secondary | ICD-10-CM | POA: Diagnosis not present

## 2018-07-13 DIAGNOSIS — Z7409 Other reduced mobility: Secondary | ICD-10-CM | POA: Diagnosis not present

## 2018-07-14 DIAGNOSIS — R278 Other lack of coordination: Secondary | ICD-10-CM | POA: Diagnosis not present

## 2018-07-14 DIAGNOSIS — F331 Major depressive disorder, recurrent, moderate: Secondary | ICD-10-CM | POA: Diagnosis not present

## 2018-07-14 DIAGNOSIS — Z7409 Other reduced mobility: Secondary | ICD-10-CM | POA: Diagnosis not present

## 2018-07-14 DIAGNOSIS — R296 Repeated falls: Secondary | ICD-10-CM | POA: Diagnosis not present

## 2018-07-14 DIAGNOSIS — S22080D Wedge compression fracture of T11-T12 vertebra, subsequent encounter for fracture with routine healing: Secondary | ICD-10-CM | POA: Diagnosis not present

## 2018-07-14 DIAGNOSIS — F0390 Unspecified dementia without behavioral disturbance: Secondary | ICD-10-CM | POA: Diagnosis not present

## 2018-07-14 DIAGNOSIS — F4323 Adjustment disorder with mixed anxiety and depressed mood: Secondary | ICD-10-CM | POA: Diagnosis not present

## 2018-07-14 DIAGNOSIS — R262 Difficulty in walking, not elsewhere classified: Secondary | ICD-10-CM | POA: Diagnosis not present

## 2018-07-14 DIAGNOSIS — M6281 Muscle weakness (generalized): Secondary | ICD-10-CM | POA: Diagnosis not present

## 2018-07-15 DIAGNOSIS — R296 Repeated falls: Secondary | ICD-10-CM | POA: Diagnosis not present

## 2018-07-15 DIAGNOSIS — R278 Other lack of coordination: Secondary | ICD-10-CM | POA: Diagnosis not present

## 2018-07-15 DIAGNOSIS — Z7409 Other reduced mobility: Secondary | ICD-10-CM | POA: Diagnosis not present

## 2018-07-15 DIAGNOSIS — M6281 Muscle weakness (generalized): Secondary | ICD-10-CM | POA: Diagnosis not present

## 2018-07-15 DIAGNOSIS — S22080D Wedge compression fracture of T11-T12 vertebra, subsequent encounter for fracture with routine healing: Secondary | ICD-10-CM | POA: Diagnosis not present

## 2018-07-15 DIAGNOSIS — R262 Difficulty in walking, not elsewhere classified: Secondary | ICD-10-CM | POA: Diagnosis not present

## 2018-07-16 DIAGNOSIS — R262 Difficulty in walking, not elsewhere classified: Secondary | ICD-10-CM | POA: Diagnosis not present

## 2018-07-16 DIAGNOSIS — S22080D Wedge compression fracture of T11-T12 vertebra, subsequent encounter for fracture with routine healing: Secondary | ICD-10-CM | POA: Diagnosis not present

## 2018-07-16 DIAGNOSIS — Z7409 Other reduced mobility: Secondary | ICD-10-CM | POA: Diagnosis not present

## 2018-07-16 DIAGNOSIS — R296 Repeated falls: Secondary | ICD-10-CM | POA: Diagnosis not present

## 2018-07-16 DIAGNOSIS — M6281 Muscle weakness (generalized): Secondary | ICD-10-CM | POA: Diagnosis not present

## 2018-07-16 DIAGNOSIS — R278 Other lack of coordination: Secondary | ICD-10-CM | POA: Diagnosis not present

## 2018-07-17 DIAGNOSIS — R278 Other lack of coordination: Secondary | ICD-10-CM | POA: Diagnosis not present

## 2018-07-17 DIAGNOSIS — R296 Repeated falls: Secondary | ICD-10-CM | POA: Diagnosis not present

## 2018-07-17 DIAGNOSIS — Z7409 Other reduced mobility: Secondary | ICD-10-CM | POA: Diagnosis not present

## 2018-07-17 DIAGNOSIS — R262 Difficulty in walking, not elsewhere classified: Secondary | ICD-10-CM | POA: Diagnosis not present

## 2018-07-17 DIAGNOSIS — S22080D Wedge compression fracture of T11-T12 vertebra, subsequent encounter for fracture with routine healing: Secondary | ICD-10-CM | POA: Diagnosis not present

## 2018-07-17 DIAGNOSIS — M6281 Muscle weakness (generalized): Secondary | ICD-10-CM | POA: Diagnosis not present

## 2018-07-19 DIAGNOSIS — R278 Other lack of coordination: Secondary | ICD-10-CM | POA: Diagnosis not present

## 2018-07-19 DIAGNOSIS — S22080D Wedge compression fracture of T11-T12 vertebra, subsequent encounter for fracture with routine healing: Secondary | ICD-10-CM | POA: Diagnosis not present

## 2018-07-19 DIAGNOSIS — R262 Difficulty in walking, not elsewhere classified: Secondary | ICD-10-CM | POA: Diagnosis not present

## 2018-07-19 DIAGNOSIS — R296 Repeated falls: Secondary | ICD-10-CM | POA: Diagnosis not present

## 2018-07-19 DIAGNOSIS — M6281 Muscle weakness (generalized): Secondary | ICD-10-CM | POA: Diagnosis not present

## 2018-07-19 DIAGNOSIS — Z7409 Other reduced mobility: Secondary | ICD-10-CM | POA: Diagnosis not present

## 2018-07-20 DIAGNOSIS — R262 Difficulty in walking, not elsewhere classified: Secondary | ICD-10-CM | POA: Diagnosis not present

## 2018-07-20 DIAGNOSIS — Z7409 Other reduced mobility: Secondary | ICD-10-CM | POA: Diagnosis not present

## 2018-07-20 DIAGNOSIS — M6281 Muscle weakness (generalized): Secondary | ICD-10-CM | POA: Diagnosis not present

## 2018-07-20 DIAGNOSIS — R278 Other lack of coordination: Secondary | ICD-10-CM | POA: Diagnosis not present

## 2018-07-20 DIAGNOSIS — S22080D Wedge compression fracture of T11-T12 vertebra, subsequent encounter for fracture with routine healing: Secondary | ICD-10-CM | POA: Diagnosis not present

## 2018-07-20 DIAGNOSIS — R296 Repeated falls: Secondary | ICD-10-CM | POA: Diagnosis not present

## 2018-07-21 DIAGNOSIS — Z7409 Other reduced mobility: Secondary | ICD-10-CM | POA: Diagnosis not present

## 2018-07-21 DIAGNOSIS — R278 Other lack of coordination: Secondary | ICD-10-CM | POA: Diagnosis not present

## 2018-07-21 DIAGNOSIS — R296 Repeated falls: Secondary | ICD-10-CM | POA: Diagnosis not present

## 2018-07-21 DIAGNOSIS — R262 Difficulty in walking, not elsewhere classified: Secondary | ICD-10-CM | POA: Diagnosis not present

## 2018-07-21 DIAGNOSIS — M6281 Muscle weakness (generalized): Secondary | ICD-10-CM | POA: Diagnosis not present

## 2018-07-21 DIAGNOSIS — S22080D Wedge compression fracture of T11-T12 vertebra, subsequent encounter for fracture with routine healing: Secondary | ICD-10-CM | POA: Diagnosis not present

## 2018-07-22 DIAGNOSIS — R278 Other lack of coordination: Secondary | ICD-10-CM | POA: Diagnosis not present

## 2018-07-22 DIAGNOSIS — M6281 Muscle weakness (generalized): Secondary | ICD-10-CM | POA: Diagnosis not present

## 2018-07-22 DIAGNOSIS — S22080D Wedge compression fracture of T11-T12 vertebra, subsequent encounter for fracture with routine healing: Secondary | ICD-10-CM | POA: Diagnosis not present

## 2018-07-22 DIAGNOSIS — R262 Difficulty in walking, not elsewhere classified: Secondary | ICD-10-CM | POA: Diagnosis not present

## 2018-07-22 DIAGNOSIS — R296 Repeated falls: Secondary | ICD-10-CM | POA: Diagnosis not present

## 2018-07-22 DIAGNOSIS — Z7409 Other reduced mobility: Secondary | ICD-10-CM | POA: Diagnosis not present

## 2018-07-23 DIAGNOSIS — R278 Other lack of coordination: Secondary | ICD-10-CM | POA: Diagnosis not present

## 2018-07-23 DIAGNOSIS — R296 Repeated falls: Secondary | ICD-10-CM | POA: Diagnosis not present

## 2018-07-23 DIAGNOSIS — S22080D Wedge compression fracture of T11-T12 vertebra, subsequent encounter for fracture with routine healing: Secondary | ICD-10-CM | POA: Diagnosis not present

## 2018-07-23 DIAGNOSIS — M6281 Muscle weakness (generalized): Secondary | ICD-10-CM | POA: Diagnosis not present

## 2018-07-23 DIAGNOSIS — Z7409 Other reduced mobility: Secondary | ICD-10-CM | POA: Diagnosis not present

## 2018-07-23 DIAGNOSIS — R262 Difficulty in walking, not elsewhere classified: Secondary | ICD-10-CM | POA: Diagnosis not present

## 2018-07-24 DIAGNOSIS — R262 Difficulty in walking, not elsewhere classified: Secondary | ICD-10-CM | POA: Diagnosis not present

## 2018-07-24 DIAGNOSIS — R296 Repeated falls: Secondary | ICD-10-CM | POA: Diagnosis not present

## 2018-07-24 DIAGNOSIS — Z7409 Other reduced mobility: Secondary | ICD-10-CM | POA: Diagnosis not present

## 2018-07-24 DIAGNOSIS — M6281 Muscle weakness (generalized): Secondary | ICD-10-CM | POA: Diagnosis not present

## 2018-07-24 DIAGNOSIS — R278 Other lack of coordination: Secondary | ICD-10-CM | POA: Diagnosis not present

## 2018-07-24 DIAGNOSIS — S22080D Wedge compression fracture of T11-T12 vertebra, subsequent encounter for fracture with routine healing: Secondary | ICD-10-CM | POA: Diagnosis not present

## 2018-07-26 DIAGNOSIS — R296 Repeated falls: Secondary | ICD-10-CM | POA: Diagnosis not present

## 2018-07-26 DIAGNOSIS — M6281 Muscle weakness (generalized): Secondary | ICD-10-CM | POA: Diagnosis not present

## 2018-07-26 DIAGNOSIS — R278 Other lack of coordination: Secondary | ICD-10-CM | POA: Diagnosis not present

## 2018-07-26 DIAGNOSIS — R262 Difficulty in walking, not elsewhere classified: Secondary | ICD-10-CM | POA: Diagnosis not present

## 2018-07-26 DIAGNOSIS — Z7409 Other reduced mobility: Secondary | ICD-10-CM | POA: Diagnosis not present

## 2018-07-26 DIAGNOSIS — S22080D Wedge compression fracture of T11-T12 vertebra, subsequent encounter for fracture with routine healing: Secondary | ICD-10-CM | POA: Diagnosis not present

## 2018-07-27 DIAGNOSIS — M6281 Muscle weakness (generalized): Secondary | ICD-10-CM | POA: Diagnosis not present

## 2018-07-27 DIAGNOSIS — F331 Major depressive disorder, recurrent, moderate: Secondary | ICD-10-CM | POA: Diagnosis not present

## 2018-07-27 DIAGNOSIS — R278 Other lack of coordination: Secondary | ICD-10-CM | POA: Diagnosis not present

## 2018-07-27 DIAGNOSIS — R262 Difficulty in walking, not elsewhere classified: Secondary | ICD-10-CM | POA: Diagnosis not present

## 2018-07-27 DIAGNOSIS — S22080D Wedge compression fracture of T11-T12 vertebra, subsequent encounter for fracture with routine healing: Secondary | ICD-10-CM | POA: Diagnosis not present

## 2018-07-27 DIAGNOSIS — Z7409 Other reduced mobility: Secondary | ICD-10-CM | POA: Diagnosis not present

## 2018-07-27 DIAGNOSIS — R296 Repeated falls: Secondary | ICD-10-CM | POA: Diagnosis not present

## 2018-07-27 DIAGNOSIS — F419 Anxiety disorder, unspecified: Secondary | ICD-10-CM | POA: Diagnosis not present

## 2018-07-28 DIAGNOSIS — R278 Other lack of coordination: Secondary | ICD-10-CM | POA: Diagnosis not present

## 2018-07-28 DIAGNOSIS — M6281 Muscle weakness (generalized): Secondary | ICD-10-CM | POA: Diagnosis not present

## 2018-07-28 DIAGNOSIS — Z7409 Other reduced mobility: Secondary | ICD-10-CM | POA: Diagnosis not present

## 2018-07-28 DIAGNOSIS — R262 Difficulty in walking, not elsewhere classified: Secondary | ICD-10-CM | POA: Diagnosis not present

## 2018-07-28 DIAGNOSIS — R296 Repeated falls: Secondary | ICD-10-CM | POA: Diagnosis not present

## 2018-07-28 DIAGNOSIS — S22080D Wedge compression fracture of T11-T12 vertebra, subsequent encounter for fracture with routine healing: Secondary | ICD-10-CM | POA: Diagnosis not present

## 2018-07-29 DIAGNOSIS — R269 Unspecified abnormalities of gait and mobility: Secondary | ICD-10-CM | POA: Diagnosis not present

## 2018-07-29 DIAGNOSIS — Z7409 Other reduced mobility: Secondary | ICD-10-CM | POA: Diagnosis not present

## 2018-07-29 DIAGNOSIS — R262 Difficulty in walking, not elsewhere classified: Secondary | ICD-10-CM | POA: Diagnosis not present

## 2018-07-29 DIAGNOSIS — R278 Other lack of coordination: Secondary | ICD-10-CM | POA: Diagnosis not present

## 2018-07-29 DIAGNOSIS — M545 Low back pain: Secondary | ICD-10-CM | POA: Diagnosis not present

## 2018-07-29 DIAGNOSIS — R296 Repeated falls: Secondary | ICD-10-CM | POA: Diagnosis not present

## 2018-07-29 DIAGNOSIS — M6281 Muscle weakness (generalized): Secondary | ICD-10-CM | POA: Diagnosis not present

## 2018-07-29 DIAGNOSIS — S22080D Wedge compression fracture of T11-T12 vertebra, subsequent encounter for fracture with routine healing: Secondary | ICD-10-CM | POA: Diagnosis not present

## 2018-07-29 DIAGNOSIS — J811 Chronic pulmonary edema: Secondary | ICD-10-CM | POA: Diagnosis not present

## 2018-07-30 DIAGNOSIS — M6281 Muscle weakness (generalized): Secondary | ICD-10-CM | POA: Diagnosis not present

## 2018-07-30 DIAGNOSIS — R296 Repeated falls: Secondary | ICD-10-CM | POA: Diagnosis not present

## 2018-07-30 DIAGNOSIS — R278 Other lack of coordination: Secondary | ICD-10-CM | POA: Diagnosis not present

## 2018-07-30 DIAGNOSIS — R262 Difficulty in walking, not elsewhere classified: Secondary | ICD-10-CM | POA: Diagnosis not present

## 2018-07-30 DIAGNOSIS — S22080D Wedge compression fracture of T11-T12 vertebra, subsequent encounter for fracture with routine healing: Secondary | ICD-10-CM | POA: Diagnosis not present

## 2018-07-30 DIAGNOSIS — Z7409 Other reduced mobility: Secondary | ICD-10-CM | POA: Diagnosis not present

## 2018-08-01 DIAGNOSIS — F0390 Unspecified dementia without behavioral disturbance: Secondary | ICD-10-CM | POA: Diagnosis not present

## 2018-08-01 DIAGNOSIS — F331 Major depressive disorder, recurrent, moderate: Secondary | ICD-10-CM | POA: Diagnosis not present

## 2018-08-02 DIAGNOSIS — S22080D Wedge compression fracture of T11-T12 vertebra, subsequent encounter for fracture with routine healing: Secondary | ICD-10-CM | POA: Diagnosis not present

## 2018-08-02 DIAGNOSIS — R278 Other lack of coordination: Secondary | ICD-10-CM | POA: Diagnosis not present

## 2018-08-02 DIAGNOSIS — R262 Difficulty in walking, not elsewhere classified: Secondary | ICD-10-CM | POA: Diagnosis not present

## 2018-08-02 DIAGNOSIS — M6281 Muscle weakness (generalized): Secondary | ICD-10-CM | POA: Diagnosis not present

## 2018-08-02 DIAGNOSIS — G301 Alzheimer's disease with late onset: Secondary | ICD-10-CM | POA: Diagnosis not present

## 2018-08-03 DIAGNOSIS — M6281 Muscle weakness (generalized): Secondary | ICD-10-CM | POA: Diagnosis not present

## 2018-08-03 DIAGNOSIS — R262 Difficulty in walking, not elsewhere classified: Secondary | ICD-10-CM | POA: Diagnosis not present

## 2018-08-03 DIAGNOSIS — S22080D Wedge compression fracture of T11-T12 vertebra, subsequent encounter for fracture with routine healing: Secondary | ICD-10-CM | POA: Diagnosis not present

## 2018-08-03 DIAGNOSIS — G301 Alzheimer's disease with late onset: Secondary | ICD-10-CM | POA: Diagnosis not present

## 2018-08-03 DIAGNOSIS — R278 Other lack of coordination: Secondary | ICD-10-CM | POA: Diagnosis not present

## 2018-08-04 DIAGNOSIS — M6281 Muscle weakness (generalized): Secondary | ICD-10-CM | POA: Diagnosis not present

## 2018-08-04 DIAGNOSIS — S22080D Wedge compression fracture of T11-T12 vertebra, subsequent encounter for fracture with routine healing: Secondary | ICD-10-CM | POA: Diagnosis not present

## 2018-08-04 DIAGNOSIS — G301 Alzheimer's disease with late onset: Secondary | ICD-10-CM | POA: Diagnosis not present

## 2018-08-04 DIAGNOSIS — R262 Difficulty in walking, not elsewhere classified: Secondary | ICD-10-CM | POA: Diagnosis not present

## 2018-08-04 DIAGNOSIS — R278 Other lack of coordination: Secondary | ICD-10-CM | POA: Diagnosis not present

## 2018-08-05 DIAGNOSIS — R262 Difficulty in walking, not elsewhere classified: Secondary | ICD-10-CM | POA: Diagnosis not present

## 2018-08-05 DIAGNOSIS — S22080D Wedge compression fracture of T11-T12 vertebra, subsequent encounter for fracture with routine healing: Secondary | ICD-10-CM | POA: Diagnosis not present

## 2018-08-05 DIAGNOSIS — G301 Alzheimer's disease with late onset: Secondary | ICD-10-CM | POA: Diagnosis not present

## 2018-08-05 DIAGNOSIS — R278 Other lack of coordination: Secondary | ICD-10-CM | POA: Diagnosis not present

## 2018-08-05 DIAGNOSIS — M6281 Muscle weakness (generalized): Secondary | ICD-10-CM | POA: Diagnosis not present

## 2018-08-06 DIAGNOSIS — R278 Other lack of coordination: Secondary | ICD-10-CM | POA: Diagnosis not present

## 2018-08-06 DIAGNOSIS — R262 Difficulty in walking, not elsewhere classified: Secondary | ICD-10-CM | POA: Diagnosis not present

## 2018-08-06 DIAGNOSIS — M6281 Muscle weakness (generalized): Secondary | ICD-10-CM | POA: Diagnosis not present

## 2018-08-06 DIAGNOSIS — S22080D Wedge compression fracture of T11-T12 vertebra, subsequent encounter for fracture with routine healing: Secondary | ICD-10-CM | POA: Diagnosis not present

## 2018-08-06 DIAGNOSIS — G301 Alzheimer's disease with late onset: Secondary | ICD-10-CM | POA: Diagnosis not present

## 2018-08-07 DIAGNOSIS — R262 Difficulty in walking, not elsewhere classified: Secondary | ICD-10-CM | POA: Diagnosis not present

## 2018-08-07 DIAGNOSIS — R278 Other lack of coordination: Secondary | ICD-10-CM | POA: Diagnosis not present

## 2018-08-07 DIAGNOSIS — G301 Alzheimer's disease with late onset: Secondary | ICD-10-CM | POA: Diagnosis not present

## 2018-08-07 DIAGNOSIS — S22080D Wedge compression fracture of T11-T12 vertebra, subsequent encounter for fracture with routine healing: Secondary | ICD-10-CM | POA: Diagnosis not present

## 2018-08-07 DIAGNOSIS — M6281 Muscle weakness (generalized): Secondary | ICD-10-CM | POA: Diagnosis not present

## 2018-08-24 DIAGNOSIS — F0390 Unspecified dementia without behavioral disturbance: Secondary | ICD-10-CM | POA: Diagnosis not present

## 2018-08-24 DIAGNOSIS — F331 Major depressive disorder, recurrent, moderate: Secondary | ICD-10-CM | POA: Diagnosis not present

## 2018-08-24 DIAGNOSIS — F4323 Adjustment disorder with mixed anxiety and depressed mood: Secondary | ICD-10-CM | POA: Diagnosis not present

## 2018-08-27 DIAGNOSIS — J811 Chronic pulmonary edema: Secondary | ICD-10-CM | POA: Diagnosis not present

## 2018-08-27 DIAGNOSIS — M545 Low back pain: Secondary | ICD-10-CM | POA: Diagnosis not present

## 2018-08-27 DIAGNOSIS — R269 Unspecified abnormalities of gait and mobility: Secondary | ICD-10-CM | POA: Diagnosis not present

## 2018-09-04 DIAGNOSIS — D649 Anemia, unspecified: Secondary | ICD-10-CM | POA: Diagnosis not present

## 2018-09-04 DIAGNOSIS — N189 Chronic kidney disease, unspecified: Secondary | ICD-10-CM | POA: Diagnosis not present

## 2018-09-07 DIAGNOSIS — N390 Urinary tract infection, site not specified: Secondary | ICD-10-CM | POA: Diagnosis not present

## 2018-09-26 DIAGNOSIS — F331 Major depressive disorder, recurrent, moderate: Secondary | ICD-10-CM | POA: Diagnosis not present

## 2018-09-26 DIAGNOSIS — M25552 Pain in left hip: Secondary | ICD-10-CM | POA: Diagnosis not present

## 2018-09-26 DIAGNOSIS — F0390 Unspecified dementia without behavioral disturbance: Secondary | ICD-10-CM | POA: Diagnosis not present

## 2018-09-26 DIAGNOSIS — F4323 Adjustment disorder with mixed anxiety and depressed mood: Secondary | ICD-10-CM | POA: Diagnosis not present

## 2018-09-29 DIAGNOSIS — R443 Hallucinations, unspecified: Secondary | ICD-10-CM | POA: Diagnosis not present

## 2018-09-29 DIAGNOSIS — Z9181 History of falling: Secondary | ICD-10-CM | POA: Diagnosis not present

## 2018-10-21 DIAGNOSIS — F4323 Adjustment disorder with mixed anxiety and depressed mood: Secondary | ICD-10-CM | POA: Diagnosis not present

## 2018-10-21 DIAGNOSIS — F0390 Unspecified dementia without behavioral disturbance: Secondary | ICD-10-CM | POA: Diagnosis not present

## 2018-10-21 DIAGNOSIS — F331 Major depressive disorder, recurrent, moderate: Secondary | ICD-10-CM | POA: Diagnosis not present

## 2018-11-01 DIAGNOSIS — F331 Major depressive disorder, recurrent, moderate: Secondary | ICD-10-CM | POA: Diagnosis not present

## 2018-11-01 DIAGNOSIS — F0390 Unspecified dementia without behavioral disturbance: Secondary | ICD-10-CM | POA: Diagnosis not present

## 2018-11-16 DIAGNOSIS — F0390 Unspecified dementia without behavioral disturbance: Secondary | ICD-10-CM | POA: Diagnosis not present

## 2018-11-16 DIAGNOSIS — F331 Major depressive disorder, recurrent, moderate: Secondary | ICD-10-CM | POA: Diagnosis not present

## 2018-11-16 DIAGNOSIS — F4323 Adjustment disorder with mixed anxiety and depressed mood: Secondary | ICD-10-CM | POA: Diagnosis not present

## 2018-11-19 DIAGNOSIS — R319 Hematuria, unspecified: Secondary | ICD-10-CM | POA: Diagnosis not present

## 2018-11-19 DIAGNOSIS — Z79899 Other long term (current) drug therapy: Secondary | ICD-10-CM | POA: Diagnosis not present

## 2018-11-19 DIAGNOSIS — N390 Urinary tract infection, site not specified: Secondary | ICD-10-CM | POA: Diagnosis not present

## 2018-12-02 DIAGNOSIS — K219 Gastro-esophageal reflux disease without esophagitis: Secondary | ICD-10-CM | POA: Diagnosis not present

## 2018-12-02 DIAGNOSIS — I10 Essential (primary) hypertension: Secondary | ICD-10-CM | POA: Diagnosis not present

## 2018-12-02 DIAGNOSIS — E785 Hyperlipidemia, unspecified: Secondary | ICD-10-CM | POA: Diagnosis not present

## 2018-12-02 DIAGNOSIS — E119 Type 2 diabetes mellitus without complications: Secondary | ICD-10-CM | POA: Diagnosis not present

## 2018-12-02 DIAGNOSIS — Z9181 History of falling: Secondary | ICD-10-CM | POA: Diagnosis not present

## 2018-12-03 DIAGNOSIS — K7460 Unspecified cirrhosis of liver: Secondary | ICD-10-CM | POA: Diagnosis not present

## 2018-12-03 DIAGNOSIS — D519 Vitamin B12 deficiency anemia, unspecified: Secondary | ICD-10-CM | POA: Diagnosis not present

## 2018-12-03 DIAGNOSIS — D649 Anemia, unspecified: Secondary | ICD-10-CM | POA: Diagnosis not present

## 2018-12-03 DIAGNOSIS — E119 Type 2 diabetes mellitus without complications: Secondary | ICD-10-CM | POA: Diagnosis not present

## 2018-12-04 DIAGNOSIS — U071 COVID-19: Secondary | ICD-10-CM | POA: Diagnosis not present

## 2018-12-08 DIAGNOSIS — F0390 Unspecified dementia without behavioral disturbance: Secondary | ICD-10-CM | POA: Diagnosis not present

## 2018-12-08 DIAGNOSIS — F331 Major depressive disorder, recurrent, moderate: Secondary | ICD-10-CM | POA: Diagnosis not present

## 2018-12-08 DIAGNOSIS — F4323 Adjustment disorder with mixed anxiety and depressed mood: Secondary | ICD-10-CM | POA: Diagnosis not present

## 2018-12-14 DIAGNOSIS — F331 Major depressive disorder, recurrent, moderate: Secondary | ICD-10-CM | POA: Diagnosis not present

## 2018-12-14 DIAGNOSIS — F0390 Unspecified dementia without behavioral disturbance: Secondary | ICD-10-CM | POA: Diagnosis not present

## 2018-12-14 DIAGNOSIS — F4323 Adjustment disorder with mixed anxiety and depressed mood: Secondary | ICD-10-CM | POA: Diagnosis not present

## 2018-12-30 DIAGNOSIS — E569 Vitamin deficiency, unspecified: Secondary | ICD-10-CM | POA: Diagnosis not present

## 2018-12-30 DIAGNOSIS — E119 Type 2 diabetes mellitus without complications: Secondary | ICD-10-CM | POA: Diagnosis not present

## 2018-12-30 DIAGNOSIS — K219 Gastro-esophageal reflux disease without esophagitis: Secondary | ICD-10-CM | POA: Diagnosis not present

## 2018-12-30 DIAGNOSIS — E785 Hyperlipidemia, unspecified: Secondary | ICD-10-CM | POA: Diagnosis not present

## 2019-01-11 DIAGNOSIS — F0281 Dementia in other diseases classified elsewhere with behavioral disturbance: Secondary | ICD-10-CM | POA: Diagnosis not present

## 2019-01-11 DIAGNOSIS — G301 Alzheimer's disease with late onset: Secondary | ICD-10-CM | POA: Diagnosis not present

## 2019-01-11 DIAGNOSIS — F339 Major depressive disorder, recurrent, unspecified: Secondary | ICD-10-CM | POA: Diagnosis not present

## 2019-01-11 DIAGNOSIS — F22 Delusional disorders: Secondary | ICD-10-CM | POA: Diagnosis not present

## 2019-01-28 DIAGNOSIS — G301 Alzheimer's disease with late onset: Secondary | ICD-10-CM | POA: Diagnosis not present

## 2019-01-28 DIAGNOSIS — F339 Major depressive disorder, recurrent, unspecified: Secondary | ICD-10-CM | POA: Diagnosis not present

## 2019-02-03 DIAGNOSIS — K219 Gastro-esophageal reflux disease without esophagitis: Secondary | ICD-10-CM | POA: Diagnosis not present

## 2019-02-03 DIAGNOSIS — E785 Hyperlipidemia, unspecified: Secondary | ICD-10-CM | POA: Diagnosis not present

## 2019-02-03 DIAGNOSIS — I10 Essential (primary) hypertension: Secondary | ICD-10-CM | POA: Diagnosis not present

## 2019-02-03 DIAGNOSIS — E119 Type 2 diabetes mellitus without complications: Secondary | ICD-10-CM | POA: Diagnosis not present

## 2019-02-09 DIAGNOSIS — F339 Major depressive disorder, recurrent, unspecified: Secondary | ICD-10-CM | POA: Diagnosis not present

## 2019-02-09 DIAGNOSIS — G3183 Dementia with Lewy bodies: Secondary | ICD-10-CM | POA: Diagnosis not present

## 2019-02-09 DIAGNOSIS — F0281 Dementia in other diseases classified elsewhere with behavioral disturbance: Secondary | ICD-10-CM | POA: Diagnosis not present

## 2019-03-08 DIAGNOSIS — F0281 Dementia in other diseases classified elsewhere with behavioral disturbance: Secondary | ICD-10-CM | POA: Diagnosis not present

## 2019-03-08 DIAGNOSIS — F22 Delusional disorders: Secondary | ICD-10-CM | POA: Diagnosis not present

## 2019-03-08 DIAGNOSIS — F339 Major depressive disorder, recurrent, unspecified: Secondary | ICD-10-CM | POA: Diagnosis not present

## 2019-03-08 DIAGNOSIS — G3183 Dementia with Lewy bodies: Secondary | ICD-10-CM | POA: Diagnosis not present

## 2019-03-10 DIAGNOSIS — E119 Type 2 diabetes mellitus without complications: Secondary | ICD-10-CM | POA: Diagnosis not present

## 2019-03-10 DIAGNOSIS — H3530 Unspecified macular degeneration: Secondary | ICD-10-CM | POA: Diagnosis not present

## 2019-03-10 DIAGNOSIS — I10 Essential (primary) hypertension: Secondary | ICD-10-CM | POA: Diagnosis not present

## 2019-03-10 DIAGNOSIS — E785 Hyperlipidemia, unspecified: Secondary | ICD-10-CM | POA: Diagnosis not present

## 2019-03-18 DIAGNOSIS — U071 COVID-19: Secondary | ICD-10-CM | POA: Diagnosis not present

## 2019-03-23 DIAGNOSIS — G3183 Dementia with Lewy bodies: Secondary | ICD-10-CM | POA: Diagnosis not present

## 2019-03-23 DIAGNOSIS — F22 Delusional disorders: Secondary | ICD-10-CM | POA: Diagnosis not present

## 2019-03-23 DIAGNOSIS — D649 Anemia, unspecified: Secondary | ICD-10-CM | POA: Diagnosis not present

## 2019-03-23 DIAGNOSIS — E785 Hyperlipidemia, unspecified: Secondary | ICD-10-CM | POA: Diagnosis not present

## 2019-04-02 DIAGNOSIS — N390 Urinary tract infection, site not specified: Secondary | ICD-10-CM | POA: Diagnosis not present

## 2019-04-02 DIAGNOSIS — D649 Anemia, unspecified: Secondary | ICD-10-CM | POA: Diagnosis not present

## 2019-04-03 DIAGNOSIS — N390 Urinary tract infection, site not specified: Secondary | ICD-10-CM | POA: Diagnosis not present

## 2019-04-03 DIAGNOSIS — R319 Hematuria, unspecified: Secondary | ICD-10-CM | POA: Diagnosis not present

## 2019-04-03 DIAGNOSIS — Z79899 Other long term (current) drug therapy: Secondary | ICD-10-CM | POA: Diagnosis not present

## 2019-04-08 DIAGNOSIS — F331 Major depressive disorder, recurrent, moderate: Secondary | ICD-10-CM | POA: Diagnosis not present

## 2019-04-08 DIAGNOSIS — F22 Delusional disorders: Secondary | ICD-10-CM | POA: Diagnosis not present

## 2019-04-08 DIAGNOSIS — F0281 Dementia in other diseases classified elsewhere with behavioral disturbance: Secondary | ICD-10-CM | POA: Diagnosis not present

## 2019-04-08 DIAGNOSIS — G3183 Dementia with Lewy bodies: Secondary | ICD-10-CM | POA: Diagnosis not present

## 2019-12-04 IMAGING — CR DG LUMBAR SPINE 2-3V
1 series · 3 of 3 positions shown · non-contrast
Comparison: June 09, 2017

CLINICAL DATA: Lumbago

EXAM:
LUMBAR SPINE - 2-3 VIEW

[Series 3: t lumbar l-5 s-1 spot · 0.14mm/px · 3 of 3 slices shown]
[im 1/3]
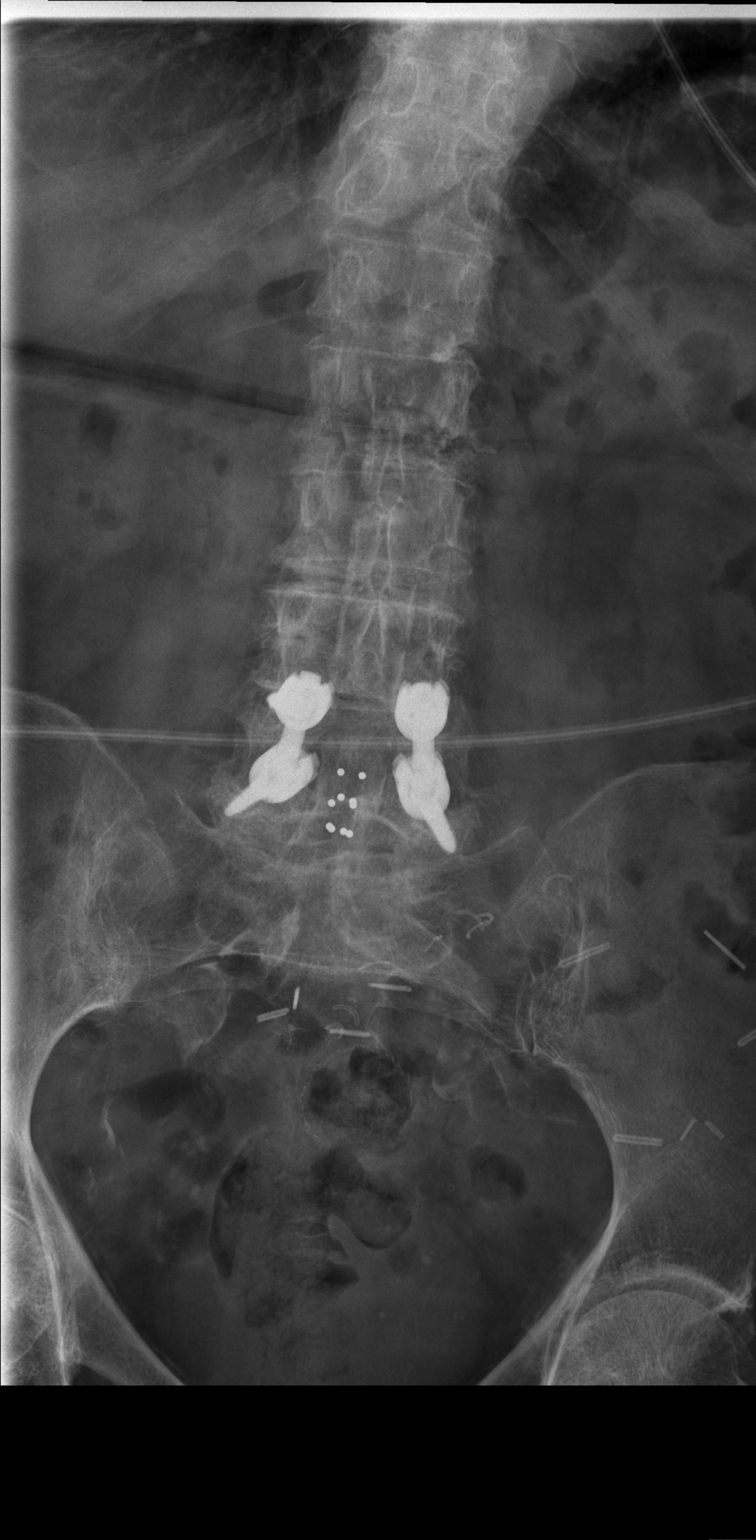
[im 2/3]
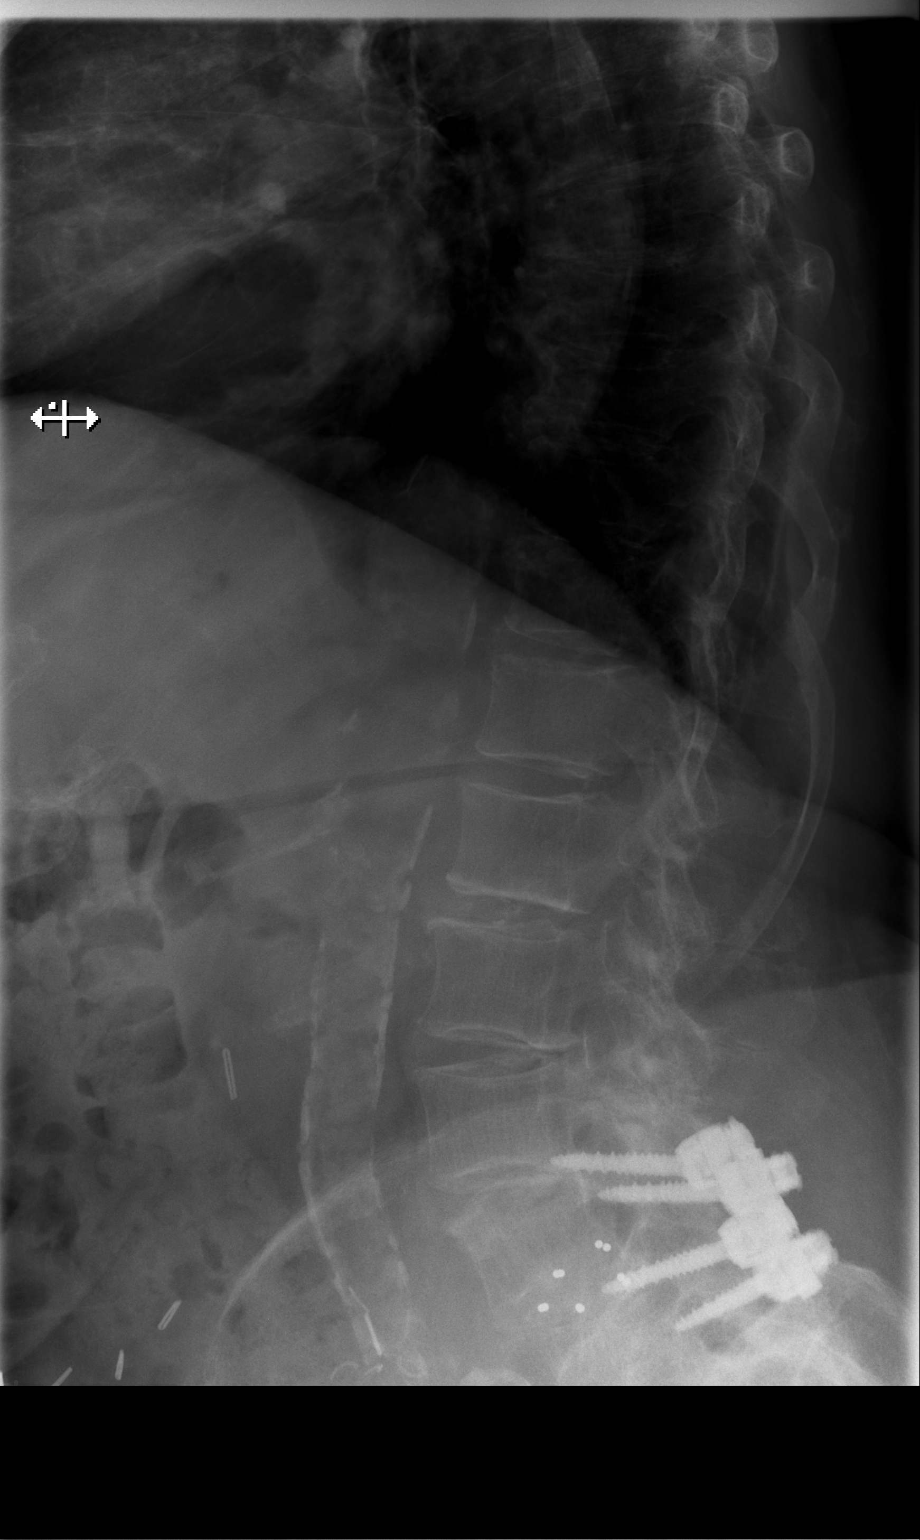
[im 3/3]
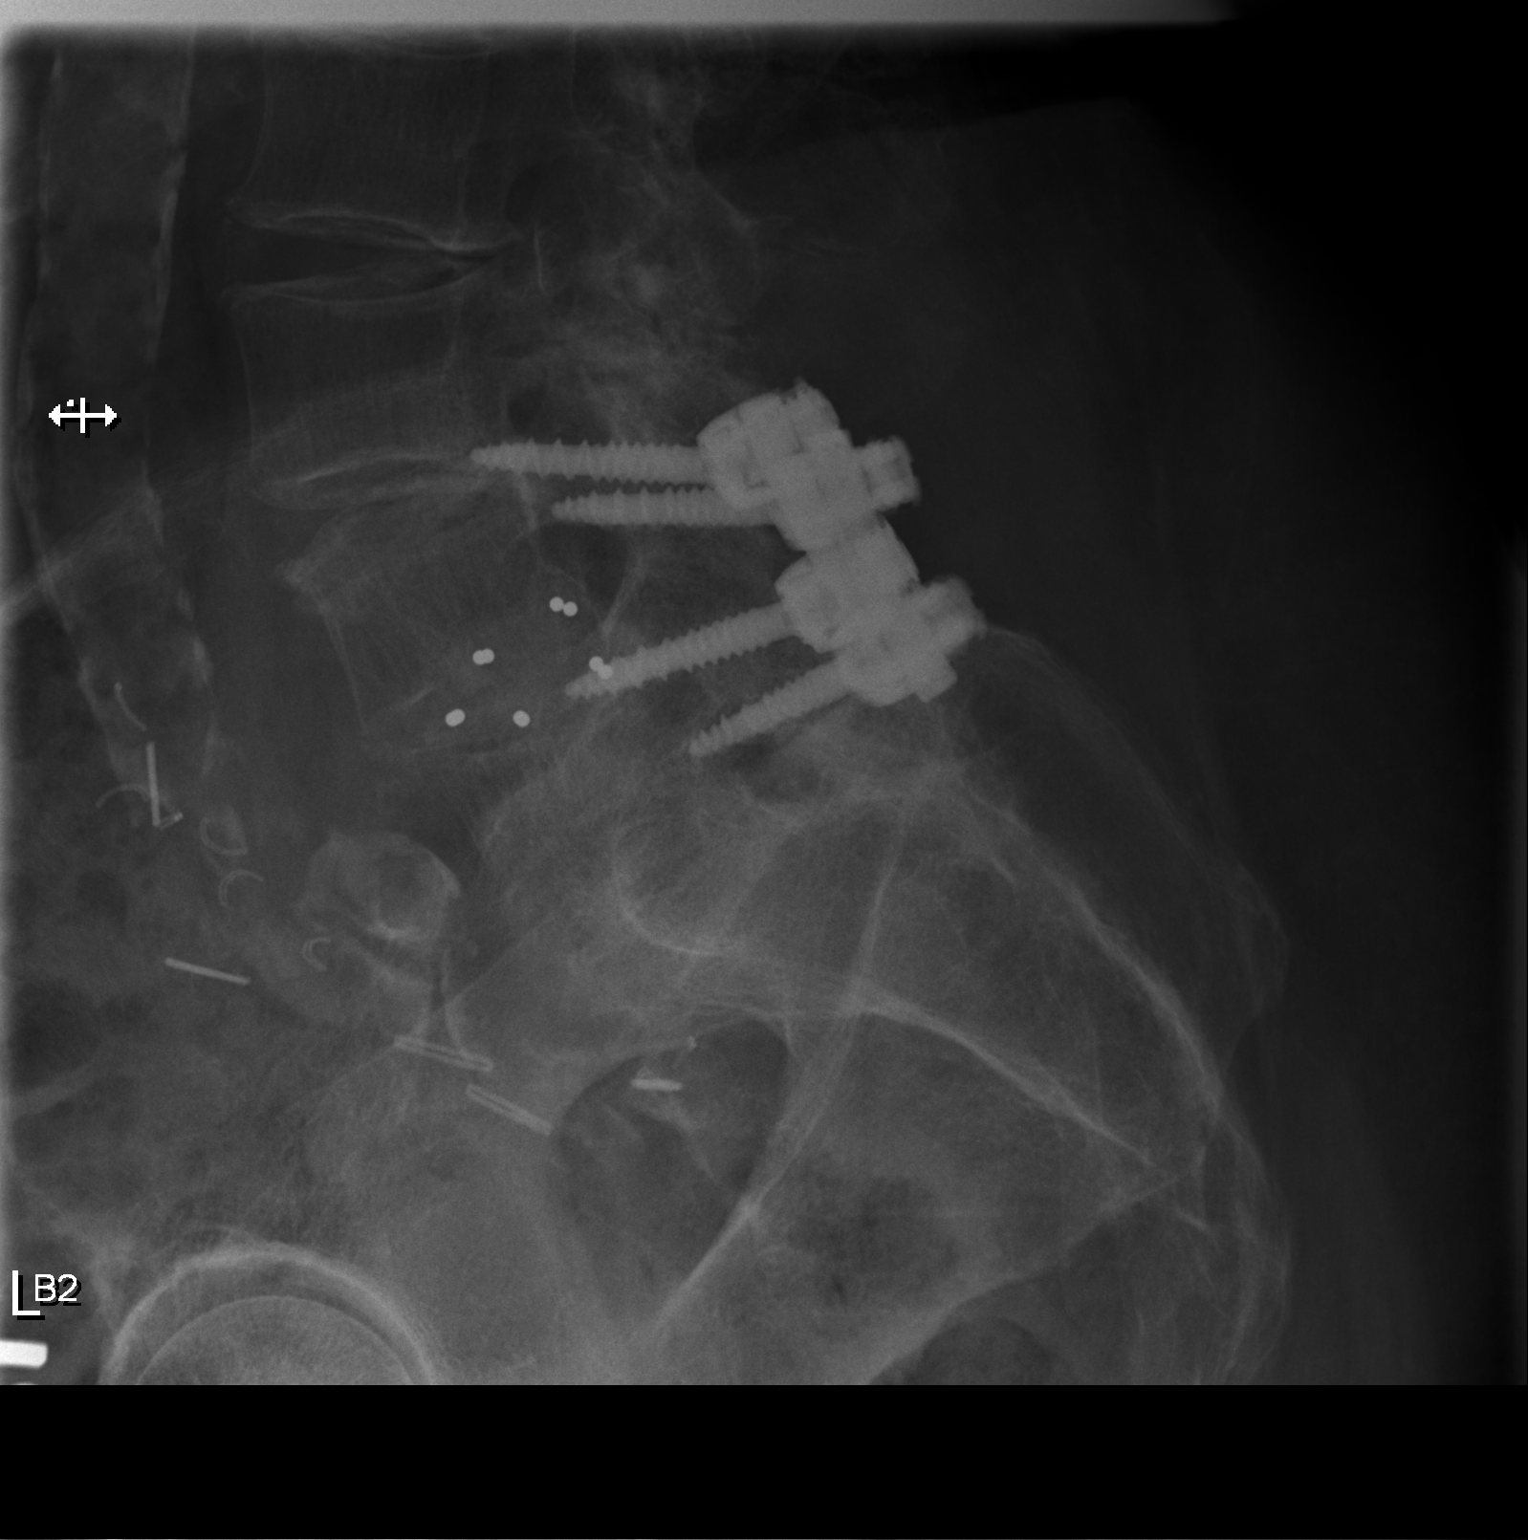

[3 of 3 positions shown; findings below may reference images not displayed]

FINDINGS: Frontal, lateral, and spot lumbosacral lateral images were obtained.
There are 5 non-rib-bearing lumbar type vertebral bodies. There is
postoperative screw and plate fixation in the lower lumbar region.
Disc spacer noted at L4-5. there is no appreciable fracture or
spondylolisthesis. There is disc space narrowing at L2-3, L3-4,
L4-5, and L5-S1. No erosive change. There is aortoiliac
atherosclerosis. There are multiple surgical clips in the pelvis.
IMPRESSION: Postoperative change in the lower lumbar region with support
hardware intact. Multilevel arthropathy. No fracture or
spondylolisthesis. There is aortoiliac atherosclerosis.

Aortic Atherosclerosis (TAQRX-OXC.C).

## 2019-12-14 IMAGING — CT CT L SPINE W/O CM
3 series · 15 of 33 positions shown, 18 images · non-contrast
Comparison: Lumbar radiographs 01/22/2018. MRI 06/20/2017. CT
abdomen pelvis 11/08/2017.

CLINICAL DATA: Fall.  Back pain

EXAM:
CT LUMBAR SPINE WITHOUT CONTRAST
TECHNIQUE: Multidetector CT imaging of the lumbar spine was performed without
intravenous contrast administration. Multiplanar CT image
reconstructions were also generated.

[Series 4: l spine soft · axial · 0.29mm/px · z∈[-233,-57]mm · 7 of 106 slices shown, 9 images]
[im 9/106  soft-tissue]
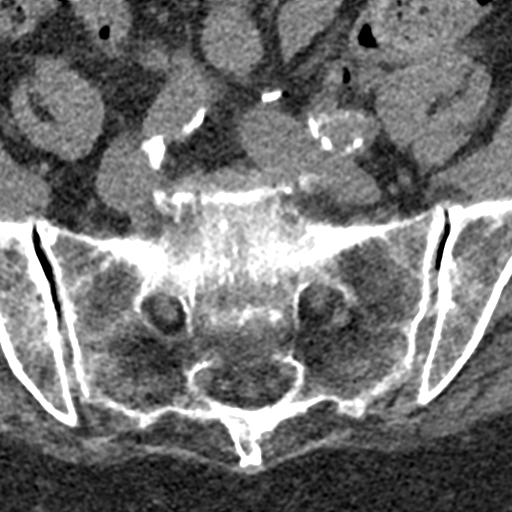
[im 9/106  bone]
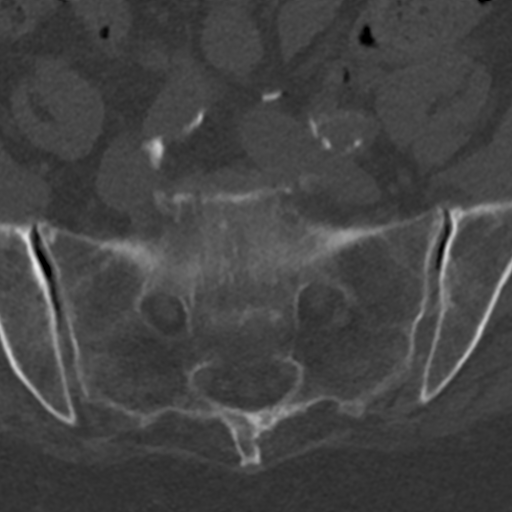
[im 25/106  bone]
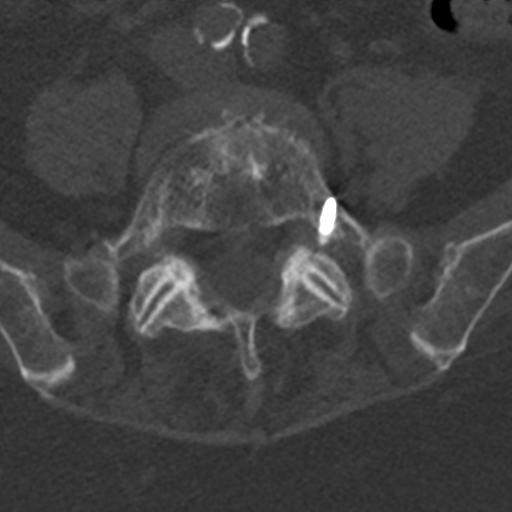
[im 41/106  bone]
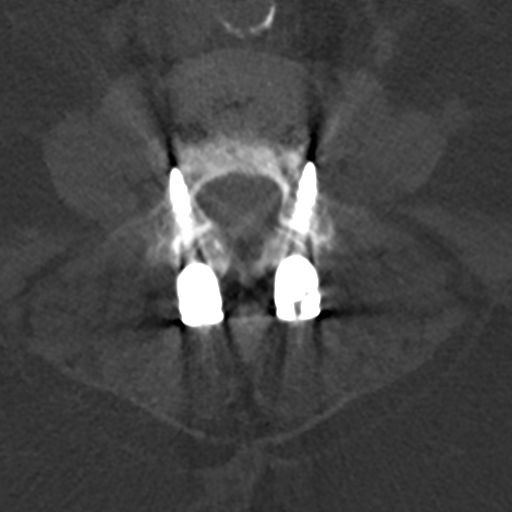
[im 57/106  bone]
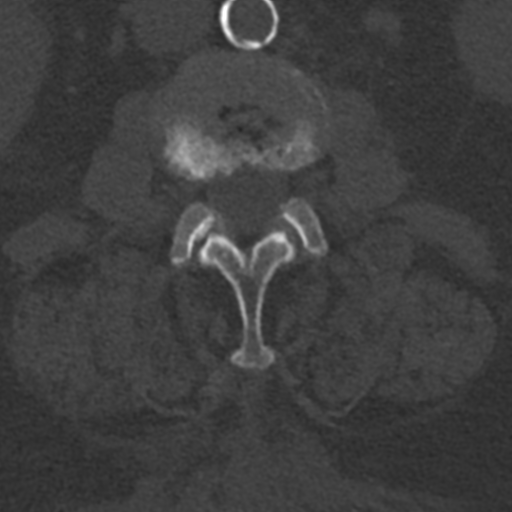
[im 65/106  soft-tissue]
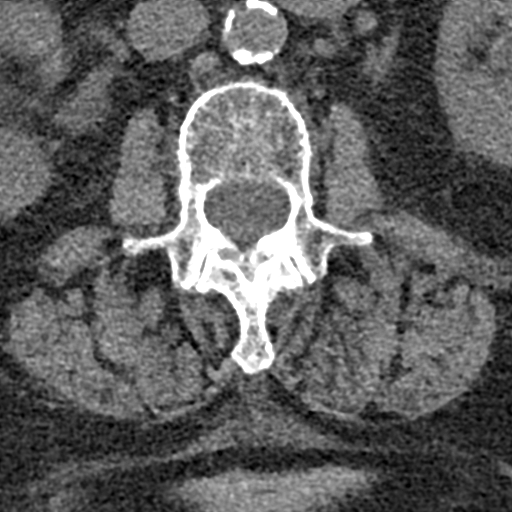
[im 65/106  bone]
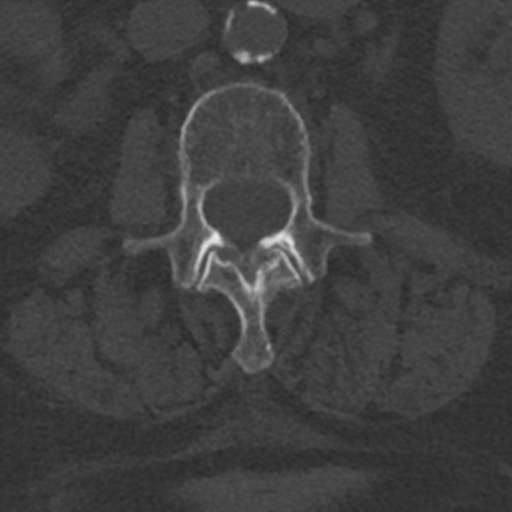
[im 81/106  bone]
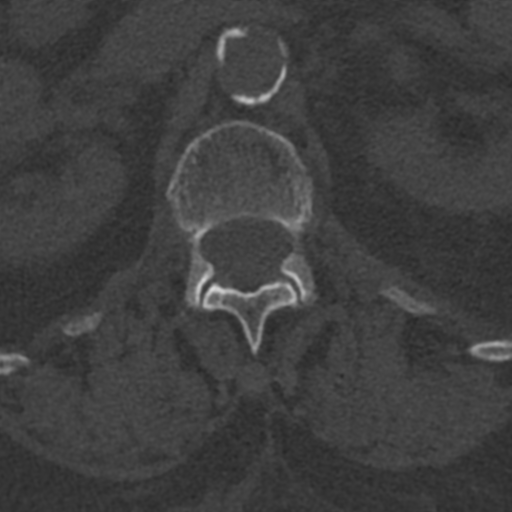
[im 97/106  bone]
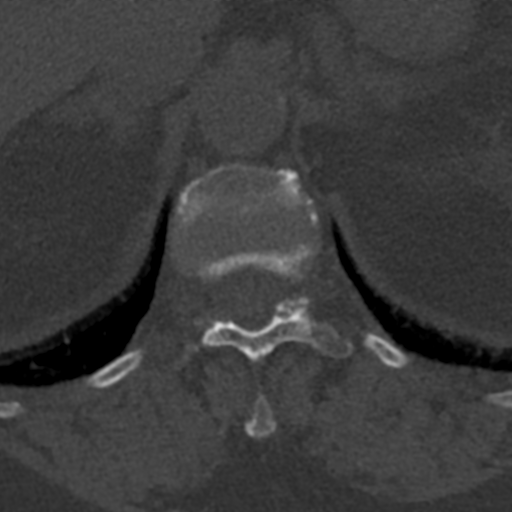

[Series 5: sagittal bone · sagittal · 0.32mm/px · 5 of 101 slices shown, 6 images]
[im 34/101  bone]
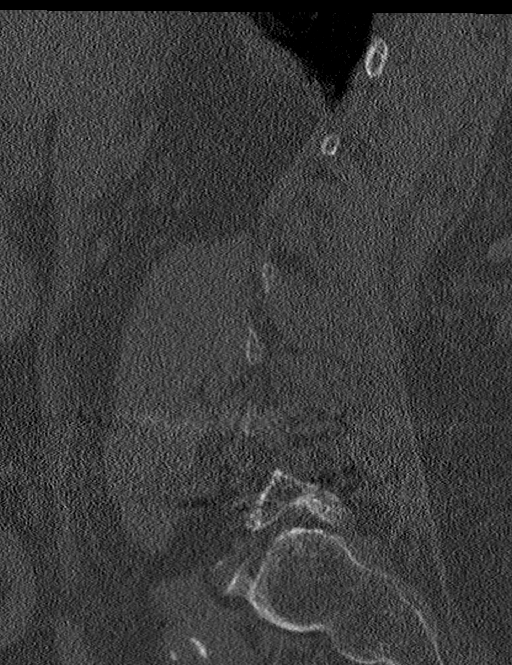
[im 42/101  bone]
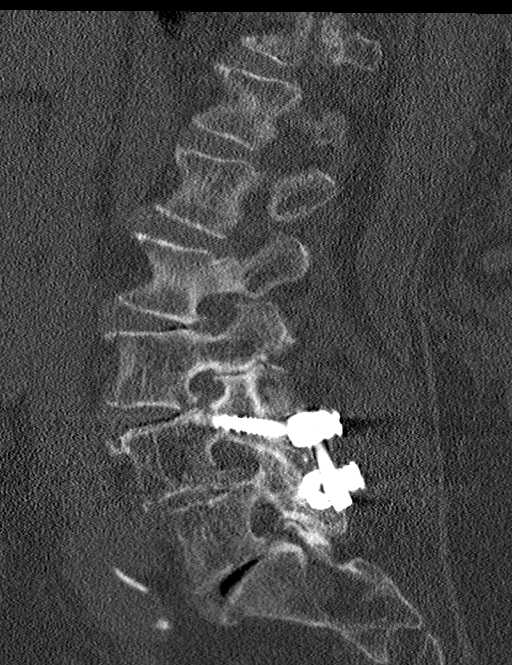
[im 51/101  soft-tissue]
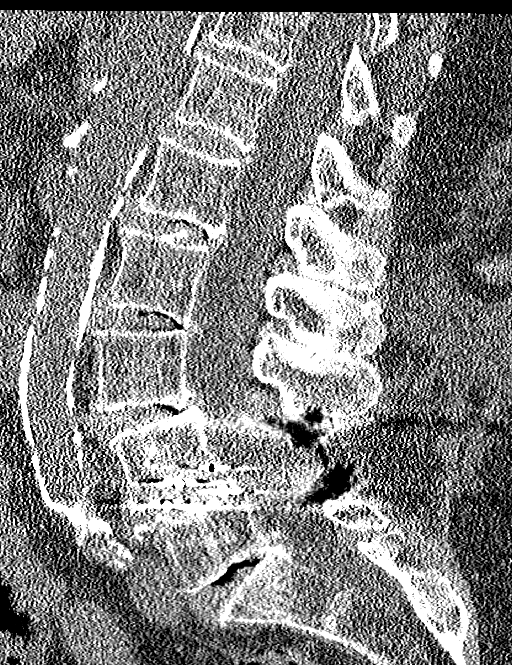
[im 51/101  bone]
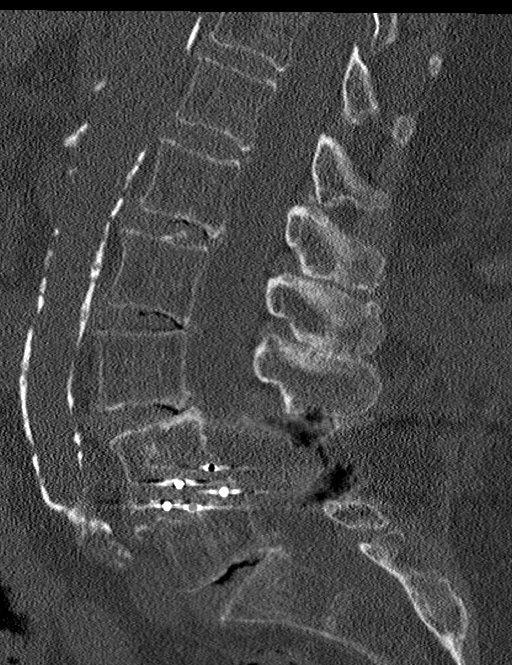
[im 59/101  bone]
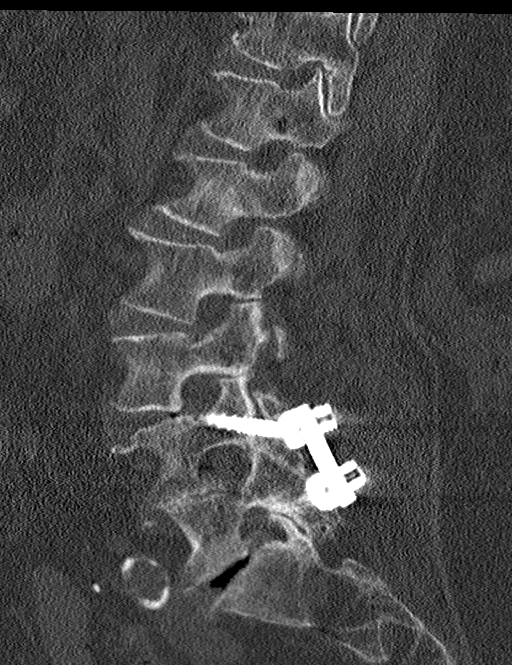
[im 67/101  bone]
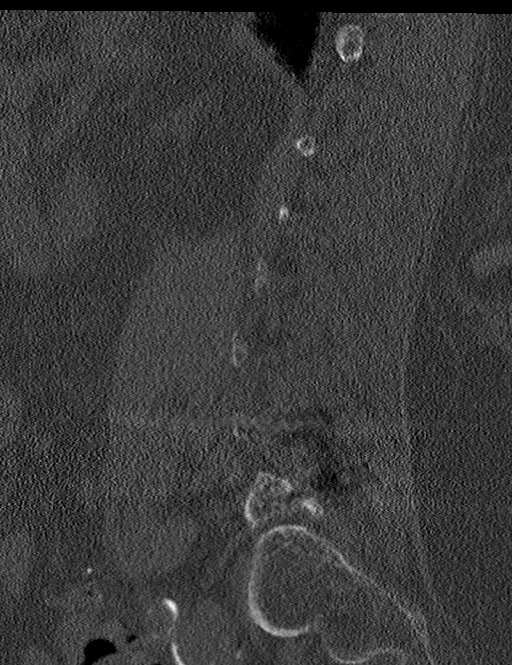

[Series 6: coronal bone · coronal · 0.42mm/px · 3 of 71 slices shown]
[im 15/71  bone]
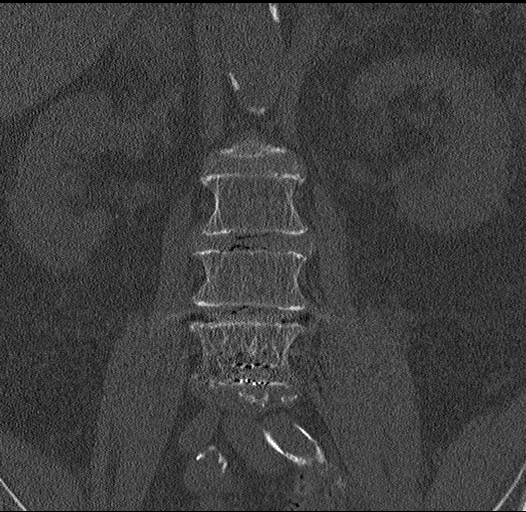
[im 29/71  bone]
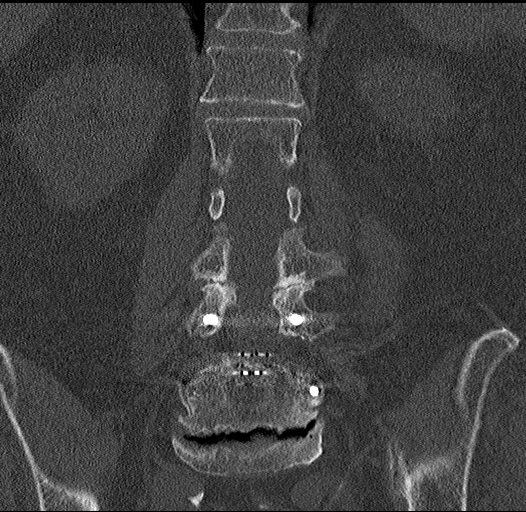
[im 43/71  bone]
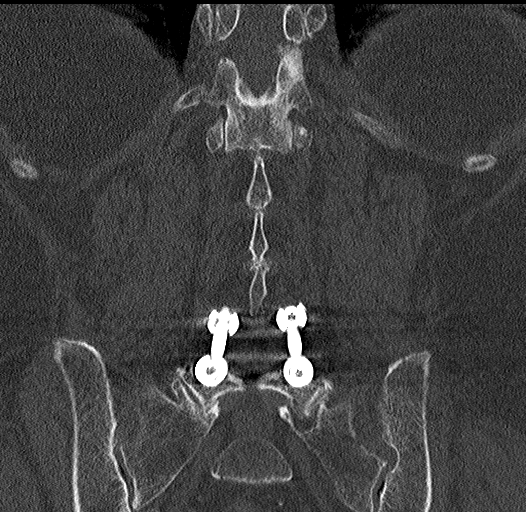

[15 of 33 positions shown; findings below may reference images not displayed]

FINDINGS: Segmentation: Normal

Alignment: Mild anterolisthesis L3-4 unchanged. Mild anterolisthesis
L5-S1 unchanged.

Vertebrae: Negative for vertebral fracture.

Paraspinal and other soft tissues: Atherosclerotic aorta without
aneurysm. No paraspinous mass.

Disc levels: T12-L1: Negative

L1-2: Mild disc degeneration. Mild disc bulging and vacuum disc.
Negative for stenosis.

L3-4: Moderate disc degeneration with disc bulging and mild
spurring. Mild facet degeneration. Negative for stenosis

L3-4: Mild anterolisthesis with moderate disc degeneration and
moderate facet degeneration compatible with adjacent segment
degeneration. No significant spinal or foraminal stenosis

L4-5: PLIF with solid interbody fusion.  Mild anterolisthesis.

L5-S1: Moderate disc and facet degeneration with grade 1
anterolisthesis. No significant stenosis.
IMPRESSION: Negative for lumbar fracture

Multilevel degenerative changes as above.  Lumbar fusion L4-5

Aortic Atherosclerosis (LRMBE-LCB.B).

## 2021-07-10 ENCOUNTER — Emergency Department: Payer: Medicare Other

## 2021-07-10 ENCOUNTER — Emergency Department
Admission: EM | Admit: 2021-07-10 | Discharge: 2021-07-10 | Disposition: A | Discharge status: Home / Self Care | Payer: Medicare Other | Attending: Emergency Medicine | Admitting: Emergency Medicine

## 2021-07-10 ENCOUNTER — Encounter: Payer: Self-pay | Admitting: Intensive Care

## 2021-07-10 ENCOUNTER — Other Ambulatory Visit: Payer: Self-pay

## 2021-07-10 DIAGNOSIS — E119 Type 2 diabetes mellitus without complications: Secondary | ICD-10-CM | POA: Insufficient documentation

## 2021-07-10 DIAGNOSIS — M25512 Pain in left shoulder: Secondary | ICD-10-CM | POA: Diagnosis not present

## 2021-07-10 DIAGNOSIS — W1789XA Other fall from one level to another, initial encounter: Secondary | ICD-10-CM | POA: Insufficient documentation

## 2021-07-10 DIAGNOSIS — Y9259 Other trade areas as the place of occurrence of the external cause: Secondary | ICD-10-CM | POA: Insufficient documentation

## 2021-07-10 DIAGNOSIS — G8911 Acute pain due to trauma: Secondary | ICD-10-CM | POA: Insufficient documentation

## 2021-07-10 DIAGNOSIS — M25552 Pain in left hip: Secondary | ICD-10-CM

## 2021-07-10 DIAGNOSIS — W19XXXA Unspecified fall, initial encounter: Secondary | ICD-10-CM

## 2021-07-10 DIAGNOSIS — M79622 Pain in left upper arm: Secondary | ICD-10-CM | POA: Diagnosis not present

## 2021-07-10 MED ORDER — FENTANYL CITRATE PF 50 MCG/ML IJ SOSY
50.0000 ug | PREFILLED_SYRINGE | Freq: Once | INTRAMUSCULAR | Status: AC
  Administered 2021-07-10: 50 ug via INTRAVENOUS
  Filled 2021-07-10: qty 1

## 2021-07-10 NOTE — Discharge Instructions (Addendum)
Please use ibuprofen (Motrin) up to 800 mg every 8 hours, naproxen (Naprosyn) up to 500 mg every 12 hours, and/or acetaminophen (Tylenol) up to 4 g/day for any continued pain 

## 2021-07-10 NOTE — ED Notes (Signed)
Called EMS for transport back to [facility] 

## 2021-07-10 NOTE — ED Notes (Signed)
Patient transported to X-ray 

## 2021-07-10 NOTE — ED Provider Notes (Signed)
[facility] Provider Note    Event Date/Time   First MD Initiated Contact with Patient 07/10/21 1722     (approximate)   History   Fall   HPI Sherry Brooks is a 88 y.o. female with past medical history including morbid obesity, bilateral lower extremity osteoarthritis, type 2 diabetes, and hypercholesterolemia who presents after a fall this morning at approximately 5 AM.  Per staff and EMS, patient was being moved with a lift when she fell out of the lift.  X-rays were allegedly done at the facility this morning but the physician at the facility stated that patient should be sent out to the emergency department for further evaluation.  Patient only complains of left upper arm and left-sided hip pain.  Patient currently in no acute distress     Physical Exam   Triage Vital Signs: ED Triage Vitals  Enc Vitals Group     BP 07/10/21 1728 (!) 149/53     Pulse Rate 07/10/21 1728 82     Resp 07/10/21 1728 20     Temp 07/10/21 1728 (!) 97.5 F (36.4 C)     Temp Source 07/10/21 1728 Oral     SpO2 07/10/21 1728 100 %     Weight 07/10/21 1728 185 lb 3 oz (84 kg)     Height 07/10/21 1728 5\' 4"  (1.626 m)     Head Circumference --      Peak Flow --      Pain Score 07/10/21 1734 0     Pain Loc --      Pain Edu? --      Excl. in [place]? --     Most recent vital signs: Vitals:   07/10/21 1923 07/10/21 2035  BP: (!) 146/66 138/70  Pulse: 93 89  Resp: 18 18  Temp: 97.9 F (36.6 C)   SpO2: 98% 100%    General: Awake, oriented to self CV:  Good peripheral perfusion.  Resp:  Normal effort.  Abd:  No distention.  Other:  Elderly Caucasian female laying in bed in no distress   ED Results / Procedures / Treatments   Labs (all labs ordered are listed, but only abnormal results are displayed) Labs Reviewed - No data to display  RADIOLOGY ED MD interpretation: Single view x-ray of the pelvis shows no definite acute fracture with severe right and  moderate to severe left femoroacetabular osteoarthritis CT of the head without contrast shows no evidence of acute abnormalities including no intracerebral hemorrhage, obvious masses, or significant edema CT of the cervical spine does not show any evidence of acute abnormalities including no acute fracture, malalignment, height loss, or dislocation Three-view x-ray of the left humerus shows diffuse decreased bone mineralization without acute fracture seen however there is a high riding humeral head that is suspicious for a superior rotator cuff tear  Agree with radiology assessment  Official radiology report(s): DG Pelvis 1-2 Views  Result Date: 07/10/2021 CLINICAL DATA:  Fall.  Pain. EXAM: PELVIS - 1-2 VIEW COMPARISON:  Pelvis radiographs 05/12/2018 FINDINGS: L4-5 posterior instrumented fusion hardware is similar to prior. Surgical clips again overlie the mid and left aspect of the pelvis. Severe right-greater-than-left femoroacetabular joint space narrowing. Severe right and moderate to severe left femoral head-neck junction degenerative osteophytes. Mild osteoarthritis of the pubic symphysis and right-greater-than-left sacroiliac joints. Within the limitations of diffuse decreased bone mineralization no definite acute fracture line is seen. IMPRESSION:: IMPRESSION: 1. No definite acute fracture. 2. Severe right and  moderate to severe left femoroacetabular osteoarthritis. Electronically Signed   By: Neita Garnet M.D.   On: 07/10/2021 18:41   CT Head Wo Contrast  Result Date: 07/10/2021 CLINICAL DATA:  Head trauma, minor (Age >= 65y) EXAM: CT HEAD WITHOUT CONTRAST TECHNIQUE: Contiguous axial images were obtained from the base of the skull through the vertex without intravenous contrast. RADIATION DOSE REDUCTION: This exam was performed according to the departmental dose-optimization program which includes automated exposure control, adjustment of the mA and/or kV according to patient size and/or use of  iterative reconstruction technique. COMPARISON:  02/01/2018. FINDINGS: Brain: No evidence of acute infarction, hemorrhage, hydrocephalus, extra-axial collection or mass lesion/mass effect. Similar moderate to advanced atrophy with ex vacuo ventricular dilation. Similar patchy and confluent white matter hypoattenuation, nonspecific but compatible chronic microvascular ischemic disease. Vascular: No hyperdense vessel identified. Calcific intracranial atherosclerosis. Skull: No acute fracture. Sinuses/Orbits: Visualized sinuses are clear.  Unremarkable orbits. Other: No mastoid effusions. IMPRESSION: 1. No evidence of acute intracranial abnormality. 2. Chronic microvascular ischemic disease and cerebral atrophy (ICD10-G31.9). Electronically Signed   By: Feliberto Harts M.D.   On: 07/10/2021 18:38   DG Humerus Left  Result Date: 07/10/2021 CLINICAL DATA:  Fall.  Left arm pain. EXAM: LEFT HUMERUS - 2+ VIEW COMPARISON:  None. FINDINGS: There is diffuse decreased bone mineralization. The humeral head is high-riding raising suspicion for a full-thickness superior rotator cuff tear. No acute fracture is seen. Moderate degenerative changes of the left acromioclavicular joint and the elbow joint. IMPRESSION:: IMPRESSION: 1. Within the limitations of diffuse decreased bone mineralization no acute fracture is seen. 2. High-riding humeral head suspicious for a full-thickness superior rotator cuff tear. Electronically Signed   By: Neita Garnet M.D.   On: 07/10/2021 18:40      PROCEDURES:  Critical Care performed: No  .1-3 Lead EKG Interpretation Performed by: Merwyn Katos, MD Authorized by: Merwyn Katos, MD     Interpretation: normal     ECG rate:  88   ECG rate assessment: normal     Rhythm: sinus rhythm     Ectopy: none     Conduction: normal     MEDICATIONS ORDERED IN ED: Medications  fentaNYL (SUBLIMAZE) injection 50 mcg (50 mcg Intravenous Given 07/10/21 1840)     IMPRESSION / MDM /  ASSESSMENT AND PLAN / ED COURSE  I reviewed the triage vital signs and the nursing notes.                              Differential diagnosis includes, but is not limited to, intracranial hemorrhage, cervical fracture, arrhythmia, basilar skull fracture  The patient is on the cardiac monitor to evaluate for evidence of arrhythmia and/or significant heart rate changes. Presenting after a fall that occurred just prior to arrival, resulting in injury to the left shoulder and hip. The mechanism of injury was a mechanical ground level fall without syncope or near-syncope. The current level of pain is moderate. There was no loss of consciousness, confusion, seizure, or memory impairment. There is not a laceration associated with the injury. Denies neck pain. The patient does not take blood thinner medications. Denies vomiting, numbness/weakness, fever Patient informed of left shoulder diagnosis and encouraged to follow-up with primary care physician for further evaluation if pain does not improve Dispo: Discharge with PCP follow-up        FINAL CLINICAL IMPRESSION(S) / ED DIAGNOSES   Final diagnoses:  Fall, initial  encounter  Acute pain of left shoulder  Acute hip pain, left     Rx / DC Orders   ED Discharge Orders     None        Note:  This document was prepared using Dragon voice recognition software and may include unintentional dictation errors.   Merwyn Katos, MD 07/10/21 2056

## 2021-07-10 NOTE — ED Triage Notes (Signed)
Pt to ED via [facility] from [facility] of a fall this morning. Pt was being moved in a [place] lift and fell. X-rays were done at the facility but MD stated that pt should have been sent out this morning. Per EMS pt has not complaints at this time. Pt is in NAD.

## 2021-07-10 NOTE — ED Notes (Signed)
Patient is non ambulatory. Facility uses lift to transport to wheelchair. Dropped patient this AM at facility while attempting to use lift. Patient denies new pain. C/o generalized chronic pain

## 2021-12-02 ENCOUNTER — Emergency Department: Payer: Medicare Other

## 2021-12-02 ENCOUNTER — Other Ambulatory Visit: Payer: Self-pay

## 2021-12-02 ENCOUNTER — Emergency Department
Admission: EM | Admit: 2021-12-02 | Discharge: 2021-12-02 | Disposition: A | Discharge status: Skilled Nursing Facility | Payer: Medicare Other | Attending: Emergency Medicine | Admitting: Emergency Medicine

## 2021-12-02 DIAGNOSIS — R600 Localized edema: Secondary | ICD-10-CM | POA: Insufficient documentation

## 2021-12-02 DIAGNOSIS — R790 Abnormal level of blood mineral: Secondary | ICD-10-CM | POA: Diagnosis not present

## 2021-12-02 DIAGNOSIS — R109 Unspecified abdominal pain: Secondary | ICD-10-CM | POA: Diagnosis not present

## 2021-12-02 DIAGNOSIS — R7309 Other abnormal glucose: Secondary | ICD-10-CM | POA: Diagnosis not present

## 2021-12-02 DIAGNOSIS — R609 Edema, unspecified: Secondary | ICD-10-CM | POA: Insufficient documentation

## 2021-12-02 LAB — COMPREHENSIVE METABOLIC PANEL
ALT: 38 U/L (ref 0–44)
AST: 33 U/L (ref 15–41)
Albumin: 2.4 g/dL — ABNORMAL LOW (ref 3.5–5.0)
Alkaline Phosphatase: 232 U/L — ABNORMAL HIGH (ref 38–126)
Anion gap: 8 (ref 5–15)
BUN: 37 mg/dL — ABNORMAL HIGH (ref 8–23)
CO2: 28 mmol/L (ref 22–32)
Calcium: 9 mg/dL (ref 8.9–10.3)
Chloride: 105 mmol/L (ref 98–111)
Creatinine, Ser: 0.94 mg/dL (ref 0.44–1.00)
GFR, Estimated: 57 mL/min — ABNORMAL LOW (ref >60)
Glucose, Bld: 149 mg/dL — ABNORMAL HIGH (ref 70–99)
Potassium: 4.1 mmol/L (ref 3.5–5.1)
Sodium: 141 mmol/L (ref 135–145)
Total Bilirubin: 0.8 mg/dL (ref 0.3–1.2)
Total Protein: 6.4 g/dL — ABNORMAL LOW (ref 6.5–8.1)

## 2021-12-02 LAB — CBC
HCT: 33.6 % — ABNORMAL LOW (ref 36.0–46.0)
Hemoglobin: 10.7 g/dL — ABNORMAL LOW (ref 12.0–15.0)
MCH: 28 pg (ref 26.0–34.0)
MCHC: 31.8 g/dL (ref 30.0–36.0)
MCV: 88 fL (ref 80.0–100.0)
Platelets: 371 10*3/uL (ref 150–400)
RBC: 3.82 MIL/uL — ABNORMAL LOW (ref 3.87–5.11)
RDW: 16.4 % — ABNORMAL HIGH (ref 11.5–15.5)
WBC: 6.8 10*3/uL (ref 4.0–10.5)
nRBC: 0 % (ref 0.0–0.2)

## 2021-12-02 LAB — URINALYSIS, ROUTINE W REFLEX MICROSCOPIC
Bilirubin Urine: NEGATIVE
Glucose, UA: NEGATIVE mg/dL
Ketones, ur: NEGATIVE mg/dL
Leukocytes,Ua: NEGATIVE
Nitrite: NEGATIVE
Protein, ur: NEGATIVE mg/dL
Specific Gravity, Urine: 1.009 (ref 1.005–1.030)
pH: 7 (ref 5.0–8.0)

## 2021-12-02 LAB — CBG MONITORING, ED: Glucose-Capillary: 143 mg/dL — ABNORMAL HIGH (ref 70–99)

## 2021-12-02 LAB — BRAIN NATRIURETIC PEPTIDE: B Natriuretic Peptide: 173.9 pg/mL — ABNORMAL HIGH (ref 0.0–100.0)

## 2021-12-02 LAB — LIPASE, BLOOD: Lipase: 33 U/L (ref 11–51)

## 2021-12-02 MED ORDER — FUROSEMIDE 10 MG/ML IJ SOLN
60.0000 mg | Freq: Once | INTRAMUSCULAR | Status: AC
  Administered 2021-12-02: 60 mg via INTRAVENOUS
  Filled 2021-12-02: qty 8

## 2021-12-02 MED ORDER — FUROSEMIDE 40 MG PO TABS: 40.0000 mg | ORAL_TABLET | Freq: Every day | ORAL | 0 refills | Status: AC

## 2021-12-02 MED ORDER — IOHEXOL 300 MG/ML  SOLN
100.0000 mL | Freq: Once | INTRAMUSCULAR | Status: AC | PRN
  Administered 2021-12-02: 100 mL via INTRAVENOUS

## 2021-12-02 NOTE — ED Notes (Signed)
Lab drew full rainbow except pink tube, including 1 set cultures at this time.

## 2021-12-02 NOTE — ED Provider Notes (Signed)
[facility] Provider Note    Event Date/Time   First MD Initiated Contact with Patient 12/02/21 1732     (approximate)   History   Abdominal Pain   HPI  Sherry Brooks is a 88 y.o. female who presents to the emergency department today from living facility because of concerns for peripheral edema.  Patient herself did have some complaints of abdominal pain.  She does not give any history for edema.  Patient cannot give perfect history here.      Physical Exam   Triage Vital Signs: ED Triage Vitals  Enc Vitals Group     BP 12/02/21 1451 (!) 151/90     Pulse Rate 12/02/21 1451 60     Resp 12/02/21 1451 (!) 26     Temp 12/02/21 1451 97.7 F (36.5 C)     Temp Source 12/02/21 1653 Oral     SpO2 12/02/21 1451 97 %     Weight 12/02/21 1453 250 lb (113.4 kg)     Height 12/02/21 1453 5' 3"  (1.6 m)     Head Circumference --      Peak Flow --      Pain Score 12/02/21 1653 0     Pain Loc --      Pain Edu? --      Excl. in [place]? --     Most recent vital signs: Vitals:   12/02/21 1451 12/02/21 1653  BP: (!) 151/90 (!) 183/71  Pulse: 60 61  Resp: (!) 26 20  Temp: 97.7 F (36.5 C) 97.9 F (36.6 C)  SpO2: 97% 99%   General: Awake, alert, not completely oriented. CV:  Good peripheral perfusion. Regular rate. Resp:  Normal effort. Lungs clear. Abd:  No distention. Minimally tender in the periumbilical region. Other:  Edema to bilateral upper and lower extremities.    ED Results / Procedures / Treatments   Labs (all labs ordered are listed, but only abnormal results are displayed) Labs Reviewed  COMPREHENSIVE METABOLIC PANEL - Abnormal; Notable for the following components:      Result Value   Glucose, Bld 149 (*)    BUN 37 (*)    Total Protein 6.4 (*)    Albumin 2.4 (*)    Alkaline Phosphatase 232 (*)    GFR, Estimated 57 (*)    All other components within normal limits  CBC - Abnormal; Notable for the following components:   RBC  3.82 (*)    Hemoglobin 10.7 (*)    HCT 33.6 (*)    RDW 16.4 (*)    All other components within normal limits  CBG MONITORING, ED - Abnormal; Notable for the following components:   Glucose-Capillary 143 (*)    All other components within normal limits  LIPASE, BLOOD  URINALYSIS, ROUTINE W REFLEX MICROSCOPIC     EKG  None   RADIOLOGY I independently interpreted and visualized the ct abd/pel. My interpretation: No free air. Radiology interpretation:  IMPRESSION:  Intra and extrahepatic biliary ductal dilatation secondary to dense  material within the common bile duct likely representing small  stones.    No other acute abnormality is noted.    I independently interpreted and visualized the left foot. My interpretation: No acute osseous abnormality Radiology interpretation:  IMPRESSION:  1. Soft tissue swelling and ulceration over the calcaneus without  underlying osseous involvement.  2. No radiopaque foreign body.  3. Microvascular calcifications compatible with diabetes.     PROCEDURES:  Critical Care  performed: No  Procedures   MEDICATIONS ORDERED IN ED: Medications - No data to display   IMPRESSION / MDM / [place] / ED COURSE  I reviewed the triage vital signs and the nursing notes.                              Differential diagnosis includes, but is not limited to, kidney disease, liver disease, heart failure, intra-abdominal infection.  Patient's presentation is most consistent with acute presentation with potential threat to life or bodily function.  Patient was sent to the emergency department today because of concerns for peripheral edema.  Work-up here does show slight elevation of BNP as well as low albumin level.  I do think he has both could be contributing to the patient's edema.  Patient was given dose of Lasix here in the emergency department.  Additionally patient was complaining of some abdominal pain.  CT abdomen was obtained  which was notable for dilation of the CBD with possible stones raising concerns for choledocho.  Pyloric with only elevation of alk phos but no elevation of bilirubin AST ALT.  Patient was not tender in the right upper quadrant.  I did have a discussion with patient's family about this finding.  Did discuss possibility of obtaining MRCP however this time patient's family felt comfortable deferring further work-up and rechecking liver test for subsequent elevation.  I think this plan is completely reasonable.  Patient also had an ulcer to her left heel.  Did obtain an x-ray to evaluate for any osseous involvement.  No osseous involvement at this time. FINAL CLINICAL IMPRESSION(S) / ED DIAGNOSES   Final diagnoses:  Peripheral edema     Note:  This document was prepared using Dragon voice recognition software and may include unintentional dictation errors.    Nance Pear, MD 12/02/21 [phone]

## 2021-12-02 NOTE — ED Notes (Signed)
Called EMS for transport back to [facility] 

## 2021-12-02 NOTE — ED Triage Notes (Signed)
Spoke with Marcelino Duster in lab, requested for assistance in specimen collection

## 2021-12-02 NOTE — ED Triage Notes (Signed)
First Nurse Note;  Pt via EMS from [facility], pt c/o R arm swelling that started 2 days. States this AM states that her arm and abd was swollen. Pt has a hx of dementia. Staff states she is altered more than normal.   69 HR 96% on RA 175/78 98.3 orally

## 2021-12-02 NOTE — ED Notes (Signed)
Pt in bed, pt awake and oriented to person and place, pt doesn't know the day of the week, re oriented pt. Pt c/o L heel pain, pt has wound on her L heel with padded dressing, wound has mal odor.  Daughter in law called and states that pt was sent from facility because she is having increasing edema and swelling.  Pt denies pain.

## 2021-12-02 NOTE — Discharge Instructions (Signed)
Please have Sherry Brooks's liver function tests rechecked in 3-4 days. Please seek medical attention for any high fevers, chest pain, shortness of breath, change in behavior, persistent vomiting, bloody stool or any other new or concerning symptoms.

## 2021-12-02 NOTE — ED Triage Notes (Signed)
Pt to ED [facility] from [facility]. Pt crying and calling out "help me! Help me!" Again and again. Pt complains of abdominal pain since 1 week. Abdomen appears distended. Pt complains of pain to L heel where has chronic wound. Pain since 2 weeks ago. Foot has sock, wound covered at this time. Heel lifted off chair with pillow. Per first nurse note, pt has hx dementia.   Edema noted to whole body especially arms. Lab called to draw labs.   Will send grey top and 1 set cultures.

## 2022-08-02 DEATH — deceased
